# Patient Record
Sex: Male | Born: 1955
Health system: Southern US, Community
[De-identification: ages and names within clinical notes are randomized; demographics above are authoritative.]

## PROBLEM LIST (undated history)

## (undated) DIAGNOSIS — E785 Hyperlipidemia, unspecified: Secondary | ICD-10-CM

## (undated) DIAGNOSIS — J449 Chronic obstructive pulmonary disease, unspecified: Secondary | ICD-10-CM

## (undated) DIAGNOSIS — Z8619 Personal history of other infectious and parasitic diseases: Secondary | ICD-10-CM

## (undated) DIAGNOSIS — F32A Depression, unspecified: Secondary | ICD-10-CM

## (undated) DIAGNOSIS — N200 Calculus of kidney: Secondary | ICD-10-CM

## (undated) DIAGNOSIS — R0902 Hypoxemia: Secondary | ICD-10-CM

## (undated) DIAGNOSIS — I10 Essential (primary) hypertension: Secondary | ICD-10-CM

## (undated) DIAGNOSIS — K219 Gastro-esophageal reflux disease without esophagitis: Secondary | ICD-10-CM

## (undated) DIAGNOSIS — M1990 Unspecified osteoarthritis, unspecified site: Secondary | ICD-10-CM

## (undated) DIAGNOSIS — G4730 Sleep apnea, unspecified: Secondary | ICD-10-CM

## (undated) DIAGNOSIS — I509 Heart failure, unspecified: Secondary | ICD-10-CM

## (undated) DIAGNOSIS — B029 Zoster without complications: Secondary | ICD-10-CM

## (undated) DIAGNOSIS — B1920 Unspecified viral hepatitis C without hepatic coma: Secondary | ICD-10-CM

## (undated) HISTORY — DX: Hypoxemia: R09.02

## (undated) HISTORY — DX: Hyperlipidemia, unspecified: E78.5

## (undated) HISTORY — PX: FOOT SURGERY: SHX648

## (undated) HISTORY — DX: Personal history of other infectious and parasitic diseases: Z86.19

## (undated) HISTORY — DX: Zoster without complications: B02.9

## (undated) HISTORY — DX: Unspecified viral hepatitis C without hepatic coma: B19.20

## (undated) HISTORY — PX: BUNIONECTOMY: SHX129

## (undated) HISTORY — DX: Gastro-esophageal reflux disease without esophagitis: K21.9

## (undated) HISTORY — DX: Unspecified osteoarthritis, unspecified site: M19.90

## (undated) HISTORY — PX: DENTAL SURGERY: SHX609

## (undated) HISTORY — PX: LITHOTRIPSY: SUR834

## (undated) HISTORY — DX: Depression, unspecified: F32.A

---

## 2005-02-14 ENCOUNTER — Ambulatory Visit: Payer: Self-pay

## 2005-02-14 LAB — HM COLONOSCOPY

## 2007-03-24 DIAGNOSIS — I10 Essential (primary) hypertension: Secondary | ICD-10-CM | POA: Insufficient documentation

## 2007-03-24 DIAGNOSIS — F321 Major depressive disorder, single episode, moderate: Secondary | ICD-10-CM | POA: Insufficient documentation

## 2007-03-24 DIAGNOSIS — M069 Rheumatoid arthritis, unspecified: Secondary | ICD-10-CM | POA: Insufficient documentation

## 2007-03-24 DIAGNOSIS — K219 Gastro-esophageal reflux disease without esophagitis: Secondary | ICD-10-CM | POA: Insufficient documentation

## 2007-03-24 DIAGNOSIS — J45909 Unspecified asthma, uncomplicated: Secondary | ICD-10-CM | POA: Insufficient documentation

## 2007-03-24 DIAGNOSIS — F32A Depression, unspecified: Secondary | ICD-10-CM | POA: Insufficient documentation

## 2007-03-24 DIAGNOSIS — Z720 Tobacco use: Secondary | ICD-10-CM | POA: Insufficient documentation

## 2007-04-07 DIAGNOSIS — G4733 Obstructive sleep apnea (adult) (pediatric): Secondary | ICD-10-CM | POA: Insufficient documentation

## 2007-04-07 DIAGNOSIS — B182 Chronic viral hepatitis C: Secondary | ICD-10-CM | POA: Insufficient documentation

## 2007-04-07 DIAGNOSIS — G4730 Sleep apnea, unspecified: Secondary | ICD-10-CM | POA: Insufficient documentation

## 2007-04-07 DIAGNOSIS — D126 Benign neoplasm of colon, unspecified: Secondary | ICD-10-CM | POA: Insufficient documentation

## 2007-04-07 DIAGNOSIS — B1920 Unspecified viral hepatitis C without hepatic coma: Secondary | ICD-10-CM | POA: Insufficient documentation

## 2007-04-15 DIAGNOSIS — L409 Psoriasis, unspecified: Secondary | ICD-10-CM | POA: Insufficient documentation

## 2007-04-15 DIAGNOSIS — L400 Psoriasis vulgaris: Secondary | ICD-10-CM | POA: Insufficient documentation

## 2007-04-15 DIAGNOSIS — K644 Residual hemorrhoidal skin tags: Secondary | ICD-10-CM | POA: Insufficient documentation

## 2008-12-22 ENCOUNTER — Ambulatory Visit: Payer: Self-pay | Admitting: Family Medicine

## 2009-06-24 DIAGNOSIS — H9319 Tinnitus, unspecified ear: Secondary | ICD-10-CM | POA: Insufficient documentation

## 2010-01-14 DIAGNOSIS — R7303 Prediabetes: Secondary | ICD-10-CM | POA: Insufficient documentation

## 2010-07-12 ENCOUNTER — Ambulatory Visit: Payer: Self-pay | Admitting: Family Medicine

## 2010-07-18 HISTORY — PX: OTHER SURGICAL HISTORY: SHX169

## 2010-08-04 ENCOUNTER — Ambulatory Visit: Payer: Self-pay | Admitting: Family Medicine

## 2010-09-08 ENCOUNTER — Ambulatory Visit: Payer: Self-pay

## 2014-09-06 LAB — TSH: TSH: 3.43 u[IU]/mL (ref <=5.90)

## 2014-09-06 LAB — HEMOGLOBIN A1C: HEMOGLOBIN A1C: 5.8

## 2014-09-15 ENCOUNTER — Ambulatory Visit: Payer: Self-pay | Admitting: Family Medicine

## 2014-10-20 HISTORY — PX: SPIROMETRY: SHX456

## 2015-02-01 LAB — HEPATIC FUNCTION PANEL
ALT: 44 U/L — AB (ref 10–40)
AST: 41 U/L — AB (ref 14–40)

## 2015-02-01 LAB — BASIC METABOLIC PANEL
BUN: 19 mg/dL (ref 4–21)
Creatinine: 1.2 mg/dL (ref 0.6–1.3)
GLUCOSE: 88 mg/dL
POTASSIUM: 4.5 mmol/L (ref 3.4–5.3)
SODIUM: 145 mmol/L (ref 137–147)

## 2015-02-01 LAB — CBC AND DIFFERENTIAL
HEMATOCRIT: 48 % (ref 41–53)
HEMOGLOBIN: 16.3 g/dL (ref 13.5–17.5)
Platelets: 185 10*3/uL (ref 150–399)
WBC: 5.1 10^3/mL

## 2015-03-02 ENCOUNTER — Encounter: Payer: Self-pay | Admitting: *Deleted

## 2015-03-02 ENCOUNTER — Encounter
Admission: RE | Disposition: A | Discharge status: Home / Self Care | Payer: Self-pay | Source: Ambulatory Visit | Attending: Internal Medicine

## 2015-03-02 ENCOUNTER — Other Ambulatory Visit: Payer: Self-pay | Admitting: Internal Medicine

## 2015-03-02 ENCOUNTER — Ambulatory Visit
Admission: RE | Admit: 2015-03-02 | Discharge: 2015-03-02 | Disposition: A | Discharge status: Home / Self Care | Payer: PRIVATE HEALTH INSURANCE | Source: Ambulatory Visit | Attending: Internal Medicine | Admitting: Internal Medicine

## 2015-03-02 DIAGNOSIS — Z8249 Family history of ischemic heart disease and other diseases of the circulatory system: Secondary | ICD-10-CM | POA: Insufficient documentation

## 2015-03-02 DIAGNOSIS — I10 Essential (primary) hypertension: Secondary | ICD-10-CM | POA: Diagnosis not present

## 2015-03-02 DIAGNOSIS — I5020 Unspecified systolic (congestive) heart failure: Secondary | ICD-10-CM | POA: Diagnosis not present

## 2015-03-02 DIAGNOSIS — Z79899 Other long term (current) drug therapy: Secondary | ICD-10-CM | POA: Insufficient documentation

## 2015-03-02 DIAGNOSIS — E785 Hyperlipidemia, unspecified: Secondary | ICD-10-CM | POA: Diagnosis not present

## 2015-03-02 DIAGNOSIS — I429 Cardiomyopathy, unspecified: Secondary | ICD-10-CM | POA: Insufficient documentation

## 2015-03-02 DIAGNOSIS — F17200 Nicotine dependence, unspecified, uncomplicated: Secondary | ICD-10-CM | POA: Diagnosis not present

## 2015-03-02 DIAGNOSIS — Z7982 Long term (current) use of aspirin: Secondary | ICD-10-CM | POA: Diagnosis not present

## 2015-03-02 DIAGNOSIS — I2510 Atherosclerotic heart disease of native coronary artery without angina pectoris: Secondary | ICD-10-CM | POA: Diagnosis not present

## 2015-03-02 DIAGNOSIS — G4733 Obstructive sleep apnea (adult) (pediatric): Secondary | ICD-10-CM | POA: Insufficient documentation

## 2015-03-02 DIAGNOSIS — I420 Dilated cardiomyopathy: Secondary | ICD-10-CM

## 2015-03-02 DIAGNOSIS — J449 Chronic obstructive pulmonary disease, unspecified: Secondary | ICD-10-CM | POA: Diagnosis not present

## 2015-03-02 DIAGNOSIS — I5022 Chronic systolic (congestive) heart failure: Secondary | ICD-10-CM

## 2015-03-02 DIAGNOSIS — I208 Other forms of angina pectoris: Secondary | ICD-10-CM

## 2015-03-02 HISTORY — DX: Sleep apnea, unspecified: G47.30

## 2015-03-02 HISTORY — DX: Essential (primary) hypertension: I10

## 2015-03-02 HISTORY — DX: Chronic obstructive pulmonary disease, unspecified: J44.9

## 2015-03-02 HISTORY — PX: CARDIAC CATHETERIZATION: SHX172

## 2015-03-02 SURGERY — RIGHT AND LEFT HEART CATH
Anesthesia: Moderate Sedation

## 2015-03-02 MED ORDER — HEPARIN (PORCINE) IN NACL 2-0.9 UNIT/ML-% IJ SOLN
INTRAMUSCULAR | Status: AC
Start: 2015-03-02 — End: 2015-03-02
  Filled 2015-03-02: qty 500

## 2015-03-02 MED ORDER — FENTANYL CITRATE (PF) 100 MCG/2ML IJ SOLN
INTRAMUSCULAR | Status: DC | PRN
  Administered 2015-03-02: 25 ug via INTRAVENOUS

## 2015-03-02 MED ORDER — FENTANYL CITRATE (PF) 100 MCG/2ML IJ SOLN
INTRAMUSCULAR | Status: AC
  Filled 2015-03-02: qty 2

## 2015-03-02 MED ORDER — MIDAZOLAM HCL 2 MG/2ML IJ SOLN
INTRAMUSCULAR | Status: AC
  Filled 2015-03-02: qty 2

## 2015-03-02 MED ORDER — IOHEXOL 300 MG/ML  SOLN
INTRAMUSCULAR | Status: DC | PRN
  Administered 2015-03-02: 30 mL via INTRA_ARTERIAL
  Administered 2015-03-02: 100 mL via INTRA_ARTERIAL

## 2015-03-02 MED ORDER — MIDAZOLAM HCL 2 MG/2ML IJ SOLN
INTRAMUSCULAR | Status: DC | PRN
  Administered 2015-03-02: 1 mg via INTRAVENOUS

## 2015-03-02 MED ORDER — LABETALOL HCL 5 MG/ML IV SOLN
INTRAVENOUS | Status: AC
  Filled 2015-03-02: qty 4

## 2015-03-02 MED ORDER — SODIUM CHLORIDE 0.9 % IV SOLN
INTRAVENOUS | Status: DC
  Administered 2015-03-02: 12:00:00 via INTRAVENOUS

## 2015-03-02 SURGICAL SUPPLY — 13 items
CATH INFINITI 5FR ANG PIGTAIL (CATHETERS) ×2 IMPLANT
CATH INFINITI 5FR JL4 (CATHETERS) ×2 IMPLANT
CATH INFINITI JR4 5F (CATHETERS) ×2 IMPLANT
CATH SWANZ 7F THERMO (CATHETERS) ×2 IMPLANT
DEVICE CLOSURE MYNXGRIP 5F (Vascular Products) ×2 IMPLANT
GUIDEWIRE EMER 3M J .025X150CM (WIRE) ×2 IMPLANT
KIT MANI 3VAL PERCEP (MISCELLANEOUS) ×2 IMPLANT
KIT RIGHT HEART (MISCELLANEOUS) ×2 IMPLANT
NEEDLE PERC 18GX7CM (NEEDLE) ×2 IMPLANT
PACK CARDIAC CATH (CUSTOM PROCEDURE TRAY) ×2 IMPLANT
SHEATH AVANTI 5FR X 11CM (SHEATH) ×2 IMPLANT
SHEATH PINNACLE 7F 10CM (SHEATH) ×2 IMPLANT
WIRE EMERALD 3MM-J .035X150CM (WIRE) ×2 IMPLANT

## 2015-03-02 NOTE — Discharge Instructions (Signed)

## 2015-03-03 ENCOUNTER — Other Ambulatory Visit: Payer: Self-pay | Admitting: Family Medicine

## 2015-03-03 ENCOUNTER — Encounter: Payer: Self-pay | Admitting: Internal Medicine

## 2015-05-25 ENCOUNTER — Encounter
Admission: RE | Admit: 2015-05-25 | Discharge: 2015-05-25 | Disposition: A | Discharge status: Home / Self Care | Payer: 59 | Source: Ambulatory Visit | Attending: Cardiology | Admitting: Cardiology

## 2015-05-25 DIAGNOSIS — Z01812 Encounter for preprocedural laboratory examination: Secondary | ICD-10-CM | POA: Insufficient documentation

## 2015-05-25 DIAGNOSIS — I429 Cardiomyopathy, unspecified: Secondary | ICD-10-CM | POA: Insufficient documentation

## 2015-05-25 HISTORY — DX: Calculus of kidney: N20.0

## 2015-05-25 HISTORY — DX: Heart failure, unspecified: I50.9

## 2015-05-25 LAB — URINALYSIS COMPLETE WITH MICROSCOPIC (ARMC ONLY)
Bacteria, UA: NONE SEEN
Bilirubin Urine: NEGATIVE
Glucose, UA: NEGATIVE mg/dL
Hgb urine dipstick: NEGATIVE
Ketones, ur: NEGATIVE mg/dL
Leukocytes, UA: NEGATIVE
NITRITE: NEGATIVE
Protein, ur: NEGATIVE mg/dL
RBC / HPF: NONE SEEN RBC/hpf (ref 0–5)
SQUAMOUS EPITHELIAL / LPF: NONE SEEN
Specific Gravity, Urine: 1.009 (ref 1.005–1.030)
WBC, UA: NONE SEEN WBC/hpf (ref 0–5)
pH: 6 (ref 5.0–8.0)

## 2015-05-25 LAB — CBC
HCT: 48.1 % (ref 40.0–52.0)
Hemoglobin: 16.2 g/dL (ref 13.0–18.0)
MCH: 31.1 pg (ref 26.0–34.0)
MCHC: 33.7 g/dL (ref 32.0–36.0)
MCV: 92.5 fL (ref 80.0–100.0)
PLATELETS: 169 10*3/uL (ref 150–440)
RBC: 5.2 MIL/uL (ref 4.40–5.90)
RDW: 13.4 % (ref 11.5–14.5)
WBC: 4.4 10*3/uL (ref 3.8–10.6)

## 2015-05-25 LAB — DIFFERENTIAL
BASOS ABS: 0 10*3/uL (ref 0–0.1)
Basophils Relative: 1 %
EOS ABS: 0.1 10*3/uL (ref 0–0.7)
Eosinophils Relative: 3 %
Lymphocytes Relative: 26 %
Lymphs Abs: 1.1 10*3/uL (ref 1.0–3.6)
MONOS PCT: 14 %
Monocytes Absolute: 0.6 10*3/uL (ref 0.2–1.0)
NEUTROS PCT: 56 %
Neutro Abs: 2.5 10*3/uL (ref 1.4–6.5)

## 2015-05-25 LAB — APTT: APTT: 27 s (ref 24–36)

## 2015-05-25 LAB — PROTIME-INR
INR: 0.89
PROTHROMBIN TIME: 12.3 s (ref 11.4–15.0)

## 2015-05-25 LAB — BASIC METABOLIC PANEL
ANION GAP: 8 (ref 5–15)
BUN: 21 mg/dL — ABNORMAL HIGH (ref 6–20)
CO2: 30 mmol/L (ref 22–32)
Calcium: 9.3 mg/dL (ref 8.9–10.3)
Chloride: 103 mmol/L (ref 101–111)
Creatinine, Ser: 1.14 mg/dL (ref 0.61–1.24)
Glucose, Bld: 100 mg/dL — ABNORMAL HIGH (ref 65–99)
POTASSIUM: 4.1 mmol/L (ref 3.5–5.1)
Sodium: 141 mmol/L (ref 135–145)

## 2015-05-25 LAB — SURGICAL PCR SCREEN
MRSA, PCR: NEGATIVE
Staphylococcus aureus: NEGATIVE

## 2015-05-25 NOTE — Patient Instructions (Addendum)
  Your procedure is scheduled on: Friday 06/03/2015 Report to Day Surgery. 2ND FLOOR MEDICAL MALL ENTRANCE To find out your arrival time please call [phone] between 1PM - 3PM on Thursday 06/02/2015.  Remember: Instructions that are not followed completely may result in serious medical risk, up to and including death, or upon the discretion of your surgeon and anesthesiologist your surgery may need to be rescheduled.    __X__ 1. Do not eat food or drink liquids after midnight. No gum chewing or hard candies.     __X__ 2. No Alcohol for 24 hours before or after surgery.   ____ 3. Bring all medications with you on the day of surgery if instructed.    __X__ 4. Notify your doctor if there is any change in your medical condition     (cold, fever, infections).     Do not wear jewelry, make-up, hairpins, clips or nail polish.  Do not wear lotions, powders, or perfumes.   Do not shave 48 hours prior to surgery. Men may shave face and neck.  Do not bring valuables to the hospital.    [facility] is not responsible for any belongings or valuables.               Contacts, dentures or bridgework may not be worn into surgery.  Leave your suitcase in the car. After surgery it may be brought to your room.  For patients admitted to the hospital, discharge time is determined by your                treatment team.   Patients discharged the day of surgery will not be allowed to drive home.   Please read over the following fact sheets that you were given:   MRSA Information and Surgical Site Infection Prevention   __X__ Take these medicines the morning of surgery with A SIP OF WATER:    1. LOSARTAN  2. METOPROLOL  3. RANITIDINE AT BEDTIME AND AGAIN MORNING OF PROCEDURE  4. VENLAFAXINE  5. USE YOUR INHALER  6.  ____ Fleet Enema (as directed)   __X__ Use CHG Soap as directed  __X__ Use inhalers on the day of surgery  ____ Stop metformin 2 days prior to surgery    ____ Take 1/2 of usual  insulin dose the night before surgery and none on the morning of surgery.   __X__ Stop Coumadin/Plavix/aspirin AS DIRECTED BY DR. Marcello Moores OR DR. CALWOOD  ____ Stop Anti-inflammatories on    ____ Stop supplements until after surgery.    __X__ Bring C-Pap to the hospital.

## 2015-06-03 ENCOUNTER — Ambulatory Visit: Payer: 59 | Admitting: Anesthesiology

## 2015-06-03 ENCOUNTER — Encounter: Payer: Self-pay | Admitting: *Deleted

## 2015-06-03 ENCOUNTER — Observation Stay
Admission: RE | Admit: 2015-06-03 | Discharge: 2015-06-04 | Disposition: A | Discharge status: Home / Self Care | Payer: 59 | Source: Ambulatory Visit | Attending: Internal Medicine | Admitting: Internal Medicine

## 2015-06-03 ENCOUNTER — Ambulatory Visit: Payer: 59

## 2015-06-03 ENCOUNTER — Encounter
Admission: RE | Disposition: A | Discharge status: Home / Self Care | Payer: Self-pay | Source: Ambulatory Visit | Attending: Internal Medicine

## 2015-06-03 DIAGNOSIS — Z950 Presence of cardiac pacemaker: Secondary | ICD-10-CM

## 2015-06-03 DIAGNOSIS — I10 Essential (primary) hypertension: Secondary | ICD-10-CM | POA: Insufficient documentation

## 2015-06-03 DIAGNOSIS — J449 Chronic obstructive pulmonary disease, unspecified: Secondary | ICD-10-CM | POA: Insufficient documentation

## 2015-06-03 DIAGNOSIS — Z7982 Long term (current) use of aspirin: Secondary | ICD-10-CM | POA: Diagnosis not present

## 2015-06-03 DIAGNOSIS — E785 Hyperlipidemia, unspecified: Secondary | ICD-10-CM | POA: Insufficient documentation

## 2015-06-03 DIAGNOSIS — Z79899 Other long term (current) drug therapy: Secondary | ICD-10-CM | POA: Diagnosis not present

## 2015-06-03 DIAGNOSIS — I25119 Atherosclerotic heart disease of native coronary artery with unspecified angina pectoris: Secondary | ICD-10-CM | POA: Insufficient documentation

## 2015-06-03 DIAGNOSIS — I420 Dilated cardiomyopathy: Secondary | ICD-10-CM | POA: Insufficient documentation

## 2015-06-03 DIAGNOSIS — I429 Cardiomyopathy, unspecified: Secondary | ICD-10-CM | POA: Insufficient documentation

## 2015-06-03 DIAGNOSIS — R079 Chest pain, unspecified: Secondary | ICD-10-CM | POA: Diagnosis not present

## 2015-06-03 DIAGNOSIS — I5022 Chronic systolic (congestive) heart failure: Secondary | ICD-10-CM | POA: Diagnosis not present

## 2015-06-03 DIAGNOSIS — F17200 Nicotine dependence, unspecified, uncomplicated: Secondary | ICD-10-CM | POA: Insufficient documentation

## 2015-06-03 DIAGNOSIS — Z6837 Body mass index (BMI) 37.0-37.9, adult: Secondary | ICD-10-CM | POA: Insufficient documentation

## 2015-06-03 DIAGNOSIS — K219 Gastro-esophageal reflux disease without esophagitis: Secondary | ICD-10-CM | POA: Diagnosis not present

## 2015-06-03 DIAGNOSIS — E669 Obesity, unspecified: Secondary | ICD-10-CM | POA: Insufficient documentation

## 2015-06-03 DIAGNOSIS — G4733 Obstructive sleep apnea (adult) (pediatric): Secondary | ICD-10-CM | POA: Diagnosis not present

## 2015-06-03 DIAGNOSIS — R001 Bradycardia, unspecified: Secondary | ICD-10-CM | POA: Insufficient documentation

## 2015-06-03 HISTORY — PX: IMPLANTABLE CARDIOVERTER DEFIBRILLATOR (ICD) GENERATOR CHANGE: SHX5469

## 2015-06-03 SURGERY — ICD GENERATOR CHANGE
Anesthesia: General | Site: Chest | Laterality: Right | Wound class: Clean

## 2015-06-03 MED ORDER — VANCOMYCIN HCL 10 G IV SOLR
1500.0000 mg | Freq: Once | INTRAVENOUS | Status: AC
  Administered 2015-06-03: 1500 mg via INTRAVENOUS
  Filled 2015-06-03: qty 1500

## 2015-06-03 MED ORDER — GENTAMICIN SULFATE 40 MG/ML IJ SOLN
INTRAMUSCULAR | Status: AC
  Filled 2015-06-03: qty 2

## 2015-06-03 MED ORDER — LISINOPRIL 20 MG PO TABS
20.0000 mg | ORAL_TABLET | Freq: Every day | ORAL | Status: DC
  Administered 2015-06-03 – 2015-06-04 (×2): 20 mg via ORAL
  Filled 2015-06-03 (×2): qty 1

## 2015-06-03 MED ORDER — BUDESONIDE-FORMOTEROL FUMARATE 160-4.5 MCG/ACT IN AERO
2.0000 | INHALATION_SPRAY | Freq: Two times a day (BID) | RESPIRATORY_TRACT | Status: DC
  Administered 2015-06-03 – 2015-06-04 (×3): 2 via RESPIRATORY_TRACT
  Filled 2015-06-03: qty 6

## 2015-06-03 MED ORDER — LABETALOL HCL 5 MG/ML IV SOLN
5.0000 mg | INTRAVENOUS | Status: DC | PRN
  Administered 2015-06-03 (×2): 5 mg via INTRAVENOUS

## 2015-06-03 MED ORDER — FENTANYL CITRATE (PF) 100 MCG/2ML IJ SOLN: 25.0000 ug | INTRAMUSCULAR | Status: DC | PRN

## 2015-06-03 MED ORDER — MIDAZOLAM HCL 2 MG/2ML IJ SOLN
INTRAMUSCULAR | Status: DC | PRN
  Administered 2015-06-03: 2 mg via INTRAVENOUS

## 2015-06-03 MED ORDER — ACETAMINOPHEN 325 MG PO TABS: 325.0000 mg | ORAL_TABLET | ORAL | Status: DC | PRN

## 2015-06-03 MED ORDER — ONDANSETRON HCL 4 MG/2ML IJ SOLN: 4.0000 mg | Freq: Once | INTRAMUSCULAR | Status: DC | PRN

## 2015-06-03 MED ORDER — ASPIRIN EC 81 MG PO TBEC
81.0000 mg | DELAYED_RELEASE_TABLET | Freq: Every day | ORAL | Status: DC
  Administered 2015-06-03 – 2015-06-04 (×2): 81 mg via ORAL
  Filled 2015-06-03 (×2): qty 1

## 2015-06-03 MED ORDER — METOPROLOL SUCCINATE ER 50 MG PO TB24
150.0000 mg | ORAL_TABLET | Freq: Every day | ORAL | Status: DC
  Administered 2015-06-04: 150 mg via ORAL
  Filled 2015-06-03 (×2): qty 1

## 2015-06-03 MED ORDER — LIDOCAINE 1 % OPTIME INJ - NO CHARGE
INTRAMUSCULAR | Status: DC | PRN
  Administered 2015-06-03: 20 mL
  Administered 2015-06-03: 10 mL

## 2015-06-03 MED ORDER — ONDANSETRON HCL 4 MG/2ML IJ SOLN: 4.0000 mg | Freq: Four times a day (QID) | INTRAMUSCULAR | Status: DC | PRN

## 2015-06-03 MED ORDER — SODIUM CHLORIDE 0.9 % IR SOLN
Freq: Once | Status: AC
  Administered 2015-06-03: 14:00:00
  Filled 2015-06-03: qty 2

## 2015-06-03 MED ORDER — CLINDAMYCIN PHOSPHATE 600 MG/50ML IV SOLN
INTRAVENOUS | Status: AC
  Filled 2015-06-03: qty 50

## 2015-06-03 MED ORDER — LABETALOL HCL 5 MG/ML IV SOLN
INTRAVENOUS | Status: AC
  Administered 2015-06-03: 5 mg via INTRAVENOUS
  Filled 2015-06-03: qty 4

## 2015-06-03 MED ORDER — HEPARIN SODIUM (PORCINE) 5000 UNIT/ML IJ SOLN
INTRAMUSCULAR | Status: AC
  Filled 2015-06-03: qty 1

## 2015-06-03 MED ORDER — SODIUM CHLORIDE 0.9 % IR SOLN
Status: DC | PRN
  Administered 2015-06-03: 100 mL

## 2015-06-03 MED ORDER — FUROSEMIDE 80 MG PO TABS
80.0000 mg | ORAL_TABLET | Freq: Every day | ORAL | Status: DC
  Administered 2015-06-03 – 2015-06-04 (×2): 80 mg via ORAL
  Filled 2015-06-03 (×3): qty 1

## 2015-06-03 MED ORDER — CLINDAMYCIN PHOSPHATE 600 MG/50ML IV SOLN
600.0000 mg | Freq: Once | INTRAVENOUS | Status: AC
  Administered 2015-06-03: 600 mg via INTRAVENOUS

## 2015-06-03 MED ORDER — PROPOFOL 10 MG/ML IV BOLUS
INTRAVENOUS | Status: DC | PRN
  Administered 2015-06-03: 40 mg via INTRAVENOUS

## 2015-06-03 MED ORDER — FAMOTIDINE 20 MG PO TABS
20.0000 mg | ORAL_TABLET | Freq: Every day | ORAL | Status: DC
  Administered 2015-06-03 – 2015-06-04 (×2): 20 mg via ORAL
  Filled 2015-06-03 (×2): qty 1

## 2015-06-03 MED ORDER — LOSARTAN POTASSIUM 50 MG PO TABS
100.0000 mg | ORAL_TABLET | Freq: Every day | ORAL | Status: DC
  Administered 2015-06-03 – 2015-06-04 (×2): 100 mg via ORAL
  Filled 2015-06-03 (×2): qty 2

## 2015-06-03 MED ORDER — VENLAFAXINE HCL ER 75 MG PO CP24
300.0000 mg | ORAL_CAPSULE | Freq: Every day | ORAL | Status: DC
  Administered 2015-06-04: 300 mg via ORAL
  Filled 2015-06-03: qty 2
  Filled 2015-06-03: qty 4

## 2015-06-03 MED ORDER — LISINOPRIL-HYDROCHLOROTHIAZIDE 20-12.5 MG PO TABS: 2.0000 | ORAL_TABLET | Freq: Every day | ORAL | Status: DC

## 2015-06-03 MED ORDER — LACTATED RINGERS IV SOLN
INTRAVENOUS | Status: DC
  Administered 2015-06-03 (×2): via INTRAVENOUS

## 2015-06-03 MED ORDER — HYDROCHLOROTHIAZIDE 12.5 MG PO CAPS
12.5000 mg | ORAL_CAPSULE | Freq: Every day | ORAL | Status: DC
  Administered 2015-06-03 – 2015-06-04 (×2): 12.5 mg via ORAL
  Filled 2015-06-03 (×2): qty 1

## 2015-06-03 MED ORDER — FENTANYL CITRATE (PF) 100 MCG/2ML IJ SOLN
INTRAMUSCULAR | Status: DC | PRN
  Administered 2015-06-03 (×2): 25 ug via INTRAVENOUS

## 2015-06-03 MED ORDER — LACTATED RINGERS IV SOLN
INTRAVENOUS | Status: DC
  Administered 2015-06-03 (×2): via INTRAVENOUS

## 2015-06-03 MED ORDER — SPIRONOLACTONE 12.5 MG HALF TABLET
12.5000 mg | ORAL_TABLET | Freq: Every day | ORAL | Status: DC
  Administered 2015-06-03 – 2015-06-04 (×2): 12.5 mg via ORAL
  Filled 2015-06-03 (×3): qty 1

## 2015-06-03 MED ORDER — PROPOFOL INFUSION 10 MG/ML OPTIME
INTRAVENOUS | Status: DC | PRN
  Administered 2015-06-03: 25 ug/kg/min via INTRAVENOUS

## 2015-06-03 SURGICAL SUPPLY — 43 items
BAG DECANTER FOR FLEXI CONT (MISCELLANEOUS) ×2 IMPLANT
CABLE SURG 12 DISP A/V CHANNEL (MISCELLANEOUS) ×2 IMPLANT
CANISTER SUCT 1200ML W/VALVE (MISCELLANEOUS) ×2 IMPLANT
CHLORAPREP W/TINT 26ML (MISCELLANEOUS) ×2 IMPLANT
COVER LIGHT HANDLE STERIS (MISCELLANEOUS) ×4 IMPLANT
COVER MAYO STAND STRL (DRAPES) ×2 IMPLANT
COVER PROBE FLX POLY STRL (MISCELLANEOUS) IMPLANT
DRAPE C-ARM XRAY 36X54 (DRAPES) ×2 IMPLANT
DRESSING TELFA 4X3 1S ST N-ADH (GAUZE/BANDAGES/DRESSINGS) ×2 IMPLANT
DRSG TEGADERM 4X4.75 (GAUZE/BANDAGES/DRESSINGS) ×2 IMPLANT
GLOVE BIO SURGEON STRL SZ7.5 (GLOVE) ×6 IMPLANT
GOWN STRL REUS W/ TWL LRG LVL3 (GOWN DISPOSABLE) ×1 IMPLANT
GOWN STRL REUS W/ TWL XL LVL3 (GOWN DISPOSABLE) ×1 IMPLANT
GOWN STRL REUS W/TWL LRG LVL3 (GOWN DISPOSABLE) ×1
GOWN STRL REUS W/TWL XL LVL3 (GOWN DISPOSABLE) ×1
HANDLE YANKAUER SUCT BULB TIP (MISCELLANEOUS) IMPLANT
HIBICLENS CHG 4% 32OZ (MISCELLANEOUS) ×2 IMPLANT
ICD VISIA MRI VR DVFB1D4 (ICD Generator) ×1 IMPLANT
IMMOBILIZER SHDR LG LX 900803 (SOFTGOODS) ×2 IMPLANT
INTRO PACEMAKR LEAD 9FR 13CM (INTRODUCER) ×2
INTRODUCER PACEMKR LD 9FR 13CM (INTRODUCER) ×1 IMPLANT
IV NS 1000ML (IV SOLUTION) ×1
IV NS 1000ML BAXH (IV SOLUTION) ×1 IMPLANT
KIT RM TURNOVER STRD PROC AR (KITS) ×2 IMPLANT
LABEL OR SOLS (LABEL) IMPLANT
LEAD SPRINT QUAT SEC 6935M-55 (Lead) ×2 IMPLANT
NEEDLE FILTER BLUNT 18X 1/2SAF (NEEDLE) ×1
NEEDLE FILTER BLUNT 18X1 1/2 (NEEDLE) ×1 IMPLANT
NEEDLE HYPO 25X1 1.5 SAFETY (NEEDLE) ×2 IMPLANT
NEEDLE SPNL 22GX3.5 QUINCKE BK (NEEDLE) IMPLANT
NS IRRIG 500ML POUR BTL (IV SOLUTION) ×2 IMPLANT
PACK PACE INSERTION (MISCELLANEOUS) ×2 IMPLANT
PAD GROUND ADULT SPLIT (MISCELLANEOUS) ×2 IMPLANT
PAD STATPAD (MISCELLANEOUS) ×2 IMPLANT
SUT DVC V-LOC 4-0 90 CLR P-12 (SUTURE) ×2
SUT DVC VLOC 3-0 CL 6 P-12 (SUTURE) ×2 IMPLANT
SUT SILK 0 CT 1 30 (SUTURE) ×2 IMPLANT
SUT VIC AB 3-0 PS2 18 (SUTURE) ×2 IMPLANT
SUTURE DVC V-LC4-0 90 CLR P-12 (SUTURE) ×1 IMPLANT
SYR CONTROL 10ML (SYRINGE) IMPLANT
SYRINGE 10CC LL (SYRINGE) IMPLANT
TOWEL OR 17X26 4PK STRL BLUE (TOWEL DISPOSABLE) ×2 IMPLANT
VISIA MRI VR DVFB1D4 (ICD Generator) ×2 IMPLANT

## 2015-06-03 NOTE — Progress Notes (Signed)
Skin verified by Purcell Nails., RN

## 2015-06-03 NOTE — Transfer of Care (Signed)
Immediate Anesthesia Transfer of Care Note  Patient: Dylan Lee  Procedure(s) Performed: Procedure(s): ICD generator insertion  (Right)  Patient Location: PACU  Anesthesia Type:General  Level of Consciousness: sedated  Airway & Oxygen Therapy: Patient Spontanous Breathing and Patient connected to face mask oxygen  Post-op Assessment: Report given to RN and Post -op Vital signs reviewed and stable  Post vital signs: Reviewed and stable  Last Vitals:  Filed Vitals:   06/03/15 1439  BP: 160/96  Pulse:   Temp: 36.3 C  Resp: 10    Complications: No apparent anesthesia complications

## 2015-06-03 NOTE — Op Note (Signed)
Dylan Lee [id number]  [id number]  Preop JJ:HERDEYC systolic heart failure Postop Dx same s/p ICD   Procedure: single chamber ICD implantation without intraoperative defibrillation threshold testing  Following the obtaining of informed consent the patient was brought to the electrophysiology laboratory in place of the fluoroscopic table in the supine position. After routine prep and drape, lidocaine was infiltrated in the right prepectoral subclavicular region and an incision was made and carried down to the layer of the prepectoral fascia using electrocautery and sharp dissection. A pocket was formed similarly.  Thereafter an attention was turned to gain access to the extrathoracic right subclavian vein which was accomplished without difficulty and without the aspiration of air or puncture of the artery. A 9 French sheath was placed for which was then passed a  single coil  active fixation defibrillator lead, model 6935M serial number [id number]. It was  passed under fluoroscopic guidance to the right ventricular apex. In its location the bipolar R wave was 13 millivolts, impedance was 709 ohms, the pacing threshold was 0.7 volts at 0.4 msec. Current at threshold was 1.1 mA.  There was no diaphragmatic pacing at 10 V. The current of injury was brisk.  The lead was secured to the prepectoral fascia and then attached to a MDT ICD, serial number  VISIA AF [id number].   The pocket was copiously irrigated with antibiotic containing saline solution. Hemostasis was assured, and the device and the lead was placed in the pocket and secured to the prepectoral fascia.  The wound was closed in 2v vicryl 3 v lock  layers in normal fashion. The wound was washed dried and a 4.0 v lock closed last layer, benzoin Steri-Strips dressing was then applied. Needle counts sponge counts and instrument counts were correct at the end of the procedure according to the staff.  Patient tolerated the procedure without  apparent complication  EBL  Minimal    Programmed VVI 40  VF 200J 30/40 ATP during charging and 35J shocks thereafter. VT monitor 162 Cx: None     THOMAS,KEVIN L., MD 06/03/2015 12:48 PM

## 2015-06-03 NOTE — Anesthesia Postprocedure Evaluation (Signed)
  Anesthesia Post-op Note  Patient: Dylan Lee  Procedure(s) Performed: Procedure(s): ICD generator insertion  (Right)  Anesthesia type:General  Patient location: PACU  Post pain: Pain level controlled  Post assessment: Post-op Vital signs reviewed, Patient's Cardiovascular Status Stable, Respiratory Function Stable, Patent Airway and No signs of Nausea or vomiting  Post vital signs: Reviewed and stable  Last Vitals:  Filed Vitals:   06/03/15 1551  BP: 160/94  Pulse: 58  Temp: 36.2 C  Resp: 13    Level of consciousness: awake, alert  and patient cooperative  Complications: No apparent anesthesia complications

## 2015-06-03 NOTE — Anesthesia Preprocedure Evaluation (Signed)
Anesthesia Evaluation  Patient identified by MRN, date of birth, ID band Patient awake    Reviewed: Allergy & Precautions, NPO status , Patient's Chart, lab work & pertinent test results  History of Anesthesia Complications Negative for: history of anesthetic complications  Airway Mallampati: III       Dental  (+) Upper Dentures, Lower Dentures   Pulmonary sleep apnea (uses BiPAP) and Continuous Positive Airway Pressure Ventilation , COPD,  COPD inhaler, Current Smoker (1-5 cig/day),           Cardiovascular hypertension, Pt. on medications and Pt. on home beta blockers (-) angina+ CAD, + Past MI and +CHF       Neuro/Psych negative neurological ROS     GI/Hepatic GERD  Medicated and Controlled,(+) Hepatitis -, C  Endo/Other    Renal/GU Renal disease (stones)     Musculoskeletal   Abdominal   Peds  Hematology   Anesthesia Other Findings   Reproductive/Obstetrics                             Anesthesia Physical Anesthesia Plan  ASA: III  Anesthesia Plan: General   Post-op Pain Management:    Induction: Intravenous  Airway Management Planned: Nasal Cannula  Additional Equipment:   Intra-op Plan:   Post-operative Plan:   Informed Consent: I have reviewed the patients History and Physical, chart, labs and discussed the procedure including the risks, benefits and alternatives for the proposed anesthesia with the patient or authorized representative who has indicated his/her understanding and acceptance.     Plan Discussed with:   Anesthesia Plan Comments:         Anesthesia Quick Evaluation

## 2015-06-03 NOTE — H&P (Signed)
Dylan Lee is a very pleasant 59 y.o. male who I was asked to see for consideration of a primary prevention ICD in a patient with NICM EF 20-25%.   Problem List:  Problem List Date Reviewed: 05-29-2015  Codes Priority Class Noted - Resolved  Chronic systolic HF (heart failure) ICD-10-CM: I50.22 ICD-9-CM: 428.22 05/29/2015 - Present  CHF (congestive heart failure) ICD-10-CM: I50.9 ICD-9-CM: 428.0 Unknown - Present  Hyperlipidemia ICD-10-CM: E78.5 ICD-9-CM: 272.4 Unknown - Present  Impotence of organic origin ICD-10-CM: N52.9 ICD-9-CM: 607.84 09/22/2014 - Present  Abnormal respiratory rate ICD-10-CM: R06.9 ICD-9-CM: 786.09 09/22/2014 - Present  Sleep apnea ICD-10-CM: G47.30 ICD-9-CM: 780.57 04/07/2007 - Present  Overview Signed 09/22/2014 9:30 AM by Loman Chroman, CMA  Improving. s/p repeat sleep study; ENT evaluation completed; no surgical intervention warranted for OSA. Sleep study 07/2010: +severe OSA; bilevel 19/14 initiated. Sleep study 07/2010: +severe OSA; bilevel 19/14 initiated.   Benign essential hypertension ICD-10-CM: I10 ICD-9-CM: 401.1 03/24/2007 - Present  Overview Signed 09/22/2014 9:30 AM by Loman Chroman, CMA  Improved wih increase lisinopril-hctz.   Tobacco use ICD-10-CM: Z72.0 ICD-9-CM: 305.1 03/24/2007 - Present  Overview Signed 09/22/2014 9:30 AM by Loman Chroman, CMA  Patient Risk Factors.       Current Medications:  Current Outpatient Prescriptions  Medication Sig Dispense Refill  . aspirin 81 MG EC tablet Take 81 mg by mouth once daily.  . FUROsemide (LASIX) 80 MG tablet Take 1 tablet (80 mg total) by mouth 2 (two) times daily. 60 tablet 3  . lisinopril-hydrochlorothiazide (PRINZIDE,ZESTORETIC) 20-12.5 mg tablet Take 2 tablets by mouth once daily.  Marland Kitchen losartan (COZAAR) 50 MG tablet Take 2 tablets (100 mg total) by mouth once daily. 60 tablet 5  . metoprolol succinate (TOPROL-XL) 25 MG XL tablet Take 2 tablets (50 mg total) by mouth once daily. 60  tablet 5  . ranitidine (ZANTAC) 150 MG tablet Take 150 mg by mouth every morning.  Marland Kitchen spironolactone (ALDACTONE) 25 MG tablet Take 0.5 tablets (12.5 mg total) by mouth once daily. 15 tablet 11  . venlafaxine (EFFEXOR-XR) 150 MG XR capsule Take 300 mg by mouth once daily.   No current facility-administered medications for this visit.    Allergies:  Review of patient's allergies indicates no known allergies.  History of Present Illness   Dylan Lee is a very pleasant 59 y.o. male with long standing hypertension poorly controlled by office BP measurements.. The patient has had shortness of breath, has had chest pain, has not had near syncope, has not syncope, and has not had palpitations. The patient has had orthopnea or PND. He has not picked up prescription for metoprolol and in general i'm not sure of his medicine compliance.  Family History   Patient denies any family history of sudden cardiac death.  Social History   History   Social History  . Marital Status: Married  Spouse Name: N/A  . Number of Children: N/A  . Years of Education: N/A   Occupational History  . Not on file.   Social History Main Topics  . Smoking status: Current Every Day Smoker  . Smokeless tobacco: Never Used  . Alcohol Use: No  . Drug Use: No  . Sexual Activity: Defer   Other Topics Concern  . Not on file   Social History Narrative   Review of Systems   14 systems reviewed pertinent positives and negatives as noted in the HPI and below.  + anxiety, back pain, depression, weakness fatigue, weight gain,  night sweats, dizziness, myalgias, muscle weakness, selling hands and feet, presyncope, leg cramps, DOE, OSA, wheezing, cough, excessive sweating  Physical Examinations   Vital Signs: BP 168/102 mmHg  Pulse 72  Ht 175.3 cm (5\' 9" )  Wt 116.801 kg (257 lb 8 oz)  BMI 38.01 kg/m2  SpO2 97% Body mass index is 38.01 kg/(m^2). General appearance: He appears well, in no apparent distress. Alert  and oriented times three.   Neck: supple, no significant adenopathy, carotids upstroke normal bilaterally, no bruits   Thyroid: thyroid is normal in size without nodules or tenderness   Lungs: clear to auscultation, no wheezes, rales or rhonchi, symmetric air entry   CV: normal rate and regular rhythm  Abdomen: soft, nontender, nondistended, no masses or organomegaly   Neurological: alert, oriented, normal speech, no focal findings or movement disorder noted   Musculoskeletal: full range of motion without pain, moves all extremities. Ambulates without difficulty.   Extremities: no pedal edema, no clubbing or cyanosis   ECG  Rhythm: normal sinus rhythm, rate=63 bpm, QTc=409 ms. QRS=104 No ST segment or T-wave abnormalities.  Echocardiogram  Date:08/2014  LA size: 3.8 Ejection fraction: 20-25% LV wall thickness: septum 1.3 Significant valvular disease: no.  Labs   Hematology Lab Results  Component Value Date  WBC 5.4 02/22/2015  HGB 16.0 02/22/2015  HCT 46.9 02/22/2015  MCV 94.4 02/22/2015  PLT 179 02/22/2015   Chemistries Lab Results  Component Value Date  CREATININE 1.3 02/22/2015  BUN 22 02/22/2015  NA 142 02/22/2015  K 4.6 02/22/2015  CL 102 02/22/2015  CO2 33.1* 02/22/2015   Liver Function Studies No results found for: ALT, AST, GGT, ALKPHOS, BILITOT  Thyroid Function Studies No results found for: TSH, T3TOTAL, T4TOTAL, THYROIDAB  INR No results found for: INR  Assessment and Plan  Given the patient's EF 25-30% NYHA class II sxs, he is at an increased risk of sudden cardiac death and may benefit from an ICD. I spent some time with him reviewing the potential benefits and risks of the procedure. Specifically, I informed him that the potential risks include but are not limited to infection, bleeding, pneumothorax, and cardiac perforation with or without tamponade. He verbalized understanding, and he signed the consent form in my presence in clinic today.  His procedure has not been scheduled. He will discuss with his wife and call Dr. Call wood's office when he is ready to proceed. He knows to be n.p.o. after midnight the night before his procedure. We plan to implant a single coil single-chamber ICD in the right infraclavicular region, and I plan not to do DFT testing. I plan to give him vancomycin and clindamycin before his procedure. He knows to call us should he have any questions or problems at any time.  I have encouraged smoking cessation, weight loss and exercise to optimize his cardiovascular health.

## 2015-06-04 ENCOUNTER — Observation Stay: Payer: 59

## 2015-06-04 DIAGNOSIS — I5022 Chronic systolic (congestive) heart failure: Secondary | ICD-10-CM | POA: Diagnosis not present

## 2015-06-04 NOTE — Progress Notes (Signed)
Pt in NAD, VSS, SR per monitor.  Pt denies any pain or discomfort at this time.  IV and telemetry discontinued per policy and procedure.  Discharge instructions given to and reviewed with pt.  Pt discharged home.

## 2015-06-04 NOTE — Discharge Instructions (Signed)
Rest right arm and shoulder for about one week Pain medicines as necessary for soreness Do not drive for several days Call the office if any fever or bleeding or swelling over the incision site. He should have restricted duties at work over the next one to 2 weeks

## 2015-06-04 NOTE — Discharge Summary (Signed)
Physician Discharge Summary  Patient ID: Dylan Lee MRN: [id number] DOB/AGE: 09-25-1955 59 y.o.  Admit date: 06/03/2015 Discharge date: 06/04/2015  Admission Diagnoses: Cardiomyopathy bradycardia preop AICD  Discharge Diagnoses:  Active Problems:   Bradycardia  cardiomyopathy  Discharged Condition: stable  Hospital Course: Patient status post AICD placement by EP Dr. Marcello Moores patient is done reasonably well ready to go home AICD site on the right shoulder looks good no bleeding no swelling no fever  Consults: None  Significant Diagnostic Studies: radiology: CXR: normal  Treatments: None  Discharge Exam: Blood pressure 161/94, pulse 60, temperature 98.4 F (36.9 C), temperature source Oral, resp. rate 18, height 5\' 9"  (1.753 m), weight 116.075 kg (255 lb 14.4 oz), SpO2 97 %. General appearance: cooperative and appears older than stated age Resp: clear to auscultation bilaterally Chest wall: no tenderness, right sided chest wall tenderness Cardio: regular rate and rhythm, S1, S2 normal, no murmur, click, rub or gallop Extremities: extremities normal, atraumatic, no cyanosis or edema Pulses: 2+ and symmetric Neurologic: Alert and oriented X 3, normal strength and tone. Normal symmetric reflexes. Normal coordination and gait  Disposition: 01-Home or Self Care  Discharge Instructions    Diet - low sodium heart healthy    Complete by:  As directed      Increase activity slowly    Complete by:  As directed             Medication List    TAKE these medications        aspirin 81 MG tablet  Take 81 mg by mouth daily.     furosemide 80 MG tablet  Commonly known as:  LASIX  Take 80 mg by mouth daily.     lisinopril-hydrochlorothiazide 20-12.5 MG per tablet  Commonly known as:  PRINZIDE,ZESTORETIC  Take 2 tablets by mouth daily.     losartan 50 MG tablet  Commonly known as:  COZAAR  Take 100 mg by mouth daily.     metoprolol succinate 100 MG 24 hr tablet   Commonly known as:  TOPROL-XL  Take 150 mg by mouth daily. Take with or immediately following a meal.     ranitidine 150 MG tablet  Commonly known as:  ZANTAC  Take 150 mg by mouth at bedtime.     spironolactone 25 MG tablet  Commonly known as:  ALDACTONE  Take 12.5 mg by mouth daily.     SYMBICORT 160-4.5 MCG/ACT inhaler  Generic drug:  budesonide-formoterol  INHALE TWO PUFFS BY MOUTH TWICE DAILY     venlafaxine XR 150 MG 24 hr capsule  Commonly known as:  EFFEXOR-XR  Take 300 mg by mouth daily with breakfast.           Follow-up Information    Follow up with CALLWOOD,DWAYNE D., MD In 2 weeks.   Specialties:  Cardiology, Internal Medicine   Contact information:   [place] [address] [phone]       Signed: Yolonda Kida. 06/04/2015, 9:30 AM

## 2015-06-04 NOTE — Progress Notes (Signed)
Subjective:  Status post AICD placement. Patient states to be done reasonably well placed in his right shoulder by EP Dr. Marcello Moores. No bleeding no fever chills or sweats feels ready to go home right arm in the immobilizer  Objective:  Vital Signs in the last 24 hours: Temp:  [97.1 F (36.2 C)-98.4 F (36.9 C)] 98.4 F (36.9 C) (09/17 0443) Pulse Rate:  [57-73] 60 (09/17 0443) Resp:  [13-20] 18 (09/17 0443) BP: (160-175)/(90-106) 161/94 mmHg (09/17 0443) SpO2:  [96 %-100 %] 97 % (09/17 0443) Weight:  [116.075 kg (255 lb 14.4 oz)-116.574 kg (257 lb)] 116.075 kg (255 lb 14.4 oz) (09/16 1644)  Intake/Output from previous day: 09/16 0701 - 09/17 0700 In: 425 [I.V.:425] Out: 1750 [Urine:1750] Intake/Output from this shift:    Physical Exam: General appearance: alert, cooperative and appears stated age Neck: no adenopathy, no carotid bruit, no JVD, supple, symmetrical, trachea midline and thyroid not enlarged, symmetric, no tenderness/mass/nodules Lungs: clear to auscultation bilaterally Heart: regular rate and rhythm, S1, S2 normal, no murmur, click, rub or gallop Abdomen: soft, non-tender; bowel sounds normal; no masses,  no organomegaly Extremities: extremities normal, atraumatic, no cyanosis or edema Pulses: 2+ and symmetric Skin: Skin color, texture, turgor normal. No rashes or lesions Neurologic: Alert and oriented X 3, normal strength and tone. Normal symmetric reflexes. Normal coordination and gait Right shoulder incision site appears to be clean bandage in place no swelling no bleeding no tenderness Lab Results: No results for input(s): WBC, HGB, PLT in the last 72 hours. No results for input(s): NA, K, CL, CO2, GLUCOSE, BUN, CREATININE in the last 72 hours. No results for input(s): TROPONINI in the last 72 hours.  Invalid input(s): CK, MB Hepatic Function Panel No results for input(s): PROT, ALBUMIN, AST, ALT, ALKPHOS, BILITOT, BILIDIR, IBILI in the last 72 hours. No results  for input(s): CHOL in the last 72 hours. No results for input(s): PROTIME in the last 72 hours.  Imaging: Imaging results have been reviewed  Cardiac Studies:  Assessment/Plan: Status post AICD placement postop day 1 Angina Cardiomyopathy CHF Coronary Artery Disease Shortness of Breath  Obesity Dilated cardiomyopathy Hypertension GERD Bradycardia . PLAN Patient patient previously well postoperatively 1 no swelling no bleeding no significant pain Continue maintaining right arm sling for 48-72 hours The patient follow-up with cardiology one to 2 weeks Should be able to return to work in 3-4 days Continue metoprolol and start lisinopril for blood pressure control in addition to spironolactone Aspirin therapy for anticoagulation Continue Lasix for swelling and congestive heart failure Cardiomyopathy and heart failure therapy with lisinopril uncertain metoprolol and spironolactone and Lasix Continue Prevacid for reflux symptoms Inhalers for COPD shortness of breath Patient appears to be okay to discharge home with follow-up     CALLWOOD,DWAYNE D. 06/04/2015, 9:14 AM

## 2015-06-05 ENCOUNTER — Encounter: Payer: Self-pay | Admitting: Family Medicine

## 2015-06-06 ENCOUNTER — Encounter: Payer: Self-pay | Admitting: Cardiology

## 2015-09-06 ENCOUNTER — Ambulatory Visit (INDEPENDENT_AMBULATORY_CARE_PROVIDER_SITE_OTHER): Payer: 59 | Admitting: Family Medicine

## 2015-09-06 ENCOUNTER — Encounter: Payer: Self-pay | Admitting: Family Medicine

## 2015-09-06 VITALS — BP 180/100 | HR 81 | Temp 99.3°F | Resp 16 | Ht 69.5 in | Wt 259.0 lb

## 2015-09-06 DIAGNOSIS — IMO0001 Reserved for inherently not codable concepts without codable children: Secondary | ICD-10-CM

## 2015-09-06 DIAGNOSIS — J449 Chronic obstructive pulmonary disease, unspecified: Secondary | ICD-10-CM | POA: Insufficient documentation

## 2015-09-06 DIAGNOSIS — R059 Cough, unspecified: Secondary | ICD-10-CM

## 2015-09-06 DIAGNOSIS — B029 Zoster without complications: Secondary | ICD-10-CM | POA: Insufficient documentation

## 2015-09-06 DIAGNOSIS — N200 Calculus of kidney: Secondary | ICD-10-CM | POA: Insufficient documentation

## 2015-09-06 DIAGNOSIS — J1289 Other viral pneumonia: Secondary | ICD-10-CM | POA: Diagnosis not present

## 2015-09-06 DIAGNOSIS — F17200 Nicotine dependence, unspecified, uncomplicated: Secondary | ICD-10-CM

## 2015-09-06 DIAGNOSIS — R7989 Other specified abnormal findings of blood chemistry: Secondary | ICD-10-CM | POA: Insufficient documentation

## 2015-09-06 DIAGNOSIS — R0609 Other forms of dyspnea: Secondary | ICD-10-CM | POA: Insufficient documentation

## 2015-09-06 DIAGNOSIS — K759 Inflammatory liver disease, unspecified: Secondary | ICD-10-CM | POA: Insufficient documentation

## 2015-09-06 DIAGNOSIS — J309 Allergic rhinitis, unspecified: Secondary | ICD-10-CM | POA: Insufficient documentation

## 2015-09-06 DIAGNOSIS — R05 Cough: Secondary | ICD-10-CM | POA: Diagnosis not present

## 2015-09-06 DIAGNOSIS — J1108 Influenza due to unidentified influenza virus with specified pneumonia: Secondary | ICD-10-CM | POA: Diagnosis not present

## 2015-09-06 DIAGNOSIS — N529 Male erectile dysfunction, unspecified: Secondary | ICD-10-CM | POA: Insufficient documentation

## 2015-09-06 HISTORY — DX: Zoster without complications: B02.9

## 2015-09-06 LAB — POCT INFLUENZA A/B
Influenza A, POC: NEGATIVE
Influenza B, POC: POSITIVE — AB

## 2015-09-06 MED ORDER — AZITHROMYCIN 250 MG PO TABS: ORAL_TABLET | ORAL | Status: AC

## 2015-09-06 MED ORDER — OSELTAMIVIR PHOSPHATE 75 MG PO CAPS: 75.0000 mg | ORAL_CAPSULE | Freq: Two times a day (BID) | ORAL | Status: AC

## 2015-09-06 NOTE — Progress Notes (Signed)
Patient: Dylan Lee Male    DOB: 1955-10-16   59 y.o.   MRN: [id number] Visit Date: 09/06/2015  Today's Provider: Lelon Huh, MD   Chief Complaint  Patient presents with  . Cough  . Shortness of Breath  . Wheezing  . Generalized Body Aches   Subjective:      Patient has had sore throat and body aches since 09/04/2015. Has symptoms of cough, sob, wheezing, sinus congestion, chills, and sweats.   Cough This is a new problem. The current episode started in the past 7 days (09/04/2015). The problem occurs every few minutes. The cough is productive of sputum. Associated symptoms include chills, a fever, headaches, myalgias, nasal congestion, postnasal drip, a sore throat, shortness of breath, sweats and wheezing. Pertinent negatives include no chest pain, ear congestion, ear pain, heartburn, hemoptysis or rash. The symptoms are aggravated by exercise. Treatments tried: mucinex, nyquil. The treatment provided no relief. His past medical history is significant for bronchitis.       No Known Allergies Previous Medications   ASPIRIN 81 MG TABLET    Take 81 mg by mouth daily.   FUROSEMIDE (LASIX) 20 MG TABLET    Take 1 tablet by mouth daily.   FUROSEMIDE (LASIX) 80 MG TABLET    Take 80 mg by mouth daily.   LISINOPRIL-HYDROCHLOROTHIAZIDE (PRINZIDE,ZESTORETIC) 20-12.5 MG PER TABLET    Take 2 tablets by mouth daily.   LOSARTAN (COZAAR) 50 MG TABLET    Take 100 mg by mouth daily.   METOPROLOL SUCCINATE (TOPROL-XL) 100 MG 24 HR TABLET    Take 150 mg by mouth daily. Take with or immediately following a meal.   METOPROLOL SUCCINATE (TOPROL-XL) 25 MG 24 HR TABLET    Take 1 tablet by mouth daily.   RANITIDINE (ZANTAC) 150 MG TABLET    Take 150 mg by mouth at bedtime.   SPIRONOLACTONE (ALDACTONE) 25 MG TABLET    Take 12.5 mg by mouth daily.   SYMBICORT 160-4.5 MCG/ACT INHALER    INHALE TWO PUFFS BY MOUTH TWICE DAILY   VENLAFAXINE XR (EFFEXOR XR) 150 MG 24 HR CAPSULE    Take 1  tablet by mouth daily. for depression   VENLAFAXINE XR (EFFEXOR-XR) 150 MG 24 HR CAPSULE    Take 300 mg by mouth daily with breakfast.    Review of Systems  Constitutional: Positive for fever and chills. Negative for appetite change.  HENT: Positive for congestion, postnasal drip, sinus pressure and sore throat. Negative for ear pain.   Respiratory: Positive for cough, chest tightness, shortness of breath and wheezing. Negative for hemoptysis.   Cardiovascular: Negative for chest pain and palpitations.  Gastrointestinal: Negative for heartburn, nausea, vomiting and abdominal pain.  Musculoskeletal: Positive for myalgias.  Skin: Negative for rash.  Neurological: Positive for dizziness, light-headedness and headaches.    Social History  Substance Use Topics  . Smoking status: Former Smoker -- 1.00 packs/day for 20 years    Types: Cigars, Cigarettes    Quit date: 09/17/1988  . Smokeless tobacco: Never Used  . Alcohol Use: No   Objective:   BP 180/100 mmHg  Pulse 81  Temp(Src) 99.3 F (37.4 C) (Oral)  Resp 16  Ht 5' 9.5" (1.765 m)  Wt 259 lb (117.482 kg)  BMI 37.71 kg/m2  SpO2 96%  Physical Exam  General Appearance:    Alert, cooperative, no distress  HENT:   bilateral TM normal without fluid or infection, neck without nodes, throat  normal without erythema or exudate, sinuses nontender and nasal mucosa congested  Eyes:    PERRL, conjunctiva/corneas clear, EOM's intact       Lungs:     Mild diffuse expiratory wheeze, no rales, respirations unlabored, good air movement.   Heart:    Regular rate and rhythm  Neurologic:   Awake, alert, oriented x 3. No apparent focal neurological           defect.       Results for orders placed or performed in visit on 09/06/15  POCT Influenza A/B  Result Value Ref Range   Influenza A, POC Negative Negative   Influenza B, POC Positive (A) Negative        Assessment & Plan:     1. Cough  - POCT Influenza A/B  2. Influenza,  bronchopneumonia Cover for secondary bacterial infection in addition to  oseltamivir (TAMIFLU) 75 MG capsule; Take 1 capsule (75 mg total) by mouth 2 (two) times daily.  Dispense: 10 capsule; Refill: 0 - azithromycin (ZITHROMAX) 250 MG tablet; 2 by mouth today, then 1 daily for 4 days  Dispense: 6 tablet; Refill: 0  Call if symptoms change or if not rapidly improving.            Lelon Huh, MD  [facility]

## 2015-12-05 ENCOUNTER — Encounter: Payer: Self-pay | Admitting: Family Medicine

## 2015-12-05 ENCOUNTER — Ambulatory Visit (INDEPENDENT_AMBULATORY_CARE_PROVIDER_SITE_OTHER): Payer: Self-pay | Admitting: Family Medicine

## 2015-12-05 VITALS — BP 144/82 | HR 75 | Temp 98.7°F | Resp 18 | Wt 255.0 lb

## 2015-12-05 DIAGNOSIS — R32 Unspecified urinary incontinence: Secondary | ICD-10-CM | POA: Insufficient documentation

## 2015-12-05 DIAGNOSIS — R0609 Other forms of dyspnea: Secondary | ICD-10-CM

## 2015-12-05 DIAGNOSIS — I10 Essential (primary) hypertension: Secondary | ICD-10-CM

## 2015-12-05 DIAGNOSIS — J449 Chronic obstructive pulmonary disease, unspecified: Secondary | ICD-10-CM

## 2015-12-05 DIAGNOSIS — Z125 Encounter for screening for malignant neoplasm of prostate: Secondary | ICD-10-CM

## 2015-12-05 DIAGNOSIS — N3941 Urge incontinence: Secondary | ICD-10-CM

## 2015-12-05 DIAGNOSIS — F329 Major depressive disorder, single episode, unspecified: Secondary | ICD-10-CM

## 2015-12-05 DIAGNOSIS — F32A Depression, unspecified: Secondary | ICD-10-CM

## 2015-12-05 DIAGNOSIS — I420 Dilated cardiomyopathy: Secondary | ICD-10-CM

## 2015-12-05 DIAGNOSIS — I5022 Chronic systolic (congestive) heart failure: Secondary | ICD-10-CM

## 2015-12-05 DIAGNOSIS — Z95810 Presence of automatic (implantable) cardiac defibrillator: Secondary | ICD-10-CM

## 2015-12-05 LAB — POCT URINALYSIS DIPSTICK
Glucose, UA: NEGATIVE
KETONES UA: NEGATIVE
Leukocytes, UA: NEGATIVE
Nitrite, UA: NEGATIVE
PH UA: 6
RBC UA: NEGATIVE
SPEC GRAV UA: 1.025
Urobilinogen, UA: 2

## 2015-12-05 MED ORDER — TIOTROPIUM BROMIDE MONOHYDRATE 1.25 MCG/ACT IN AERS: 2.0000 | INHALATION_SPRAY | Freq: Every day | RESPIRATORY_TRACT | Status: DC

## 2015-12-05 NOTE — Progress Notes (Signed)
Patient: Dylan Lee Male    DOB: 01/20/1956   60 y.o.   MRN: [id number] Visit Date: 12/05/2015  Today's Provider: Lelon Huh, MD   Chief Complaint  Patient presents with  . Urinary Incontinence  . Shortness of Breath   Subjective:    Shortness of Breath This is a new problem. Episode onset: 2 months ago  The problem has been gradually worsening. The average episode lasts 5 minutes. Associated symptoms include headaches, leg swelling (occasionally), PND, a rash and wheezing. Pertinent negatives include no abdominal pain, chest pain, ear pain, fever, hemoptysis, leg pain, neck pain, orthopnea, rhinorrhea, sore throat, syncope or vomiting. The symptoms are aggravated by exercise (exertion). He has tried rest for the symptoms. The treatment provided moderate relief.  Patient states during episodes of shortness of breath after exertion he has some urinary incontinence. These symptoms started after he had his pacemaker/ defibrillator placed 2 months ago. He states he continues to smoke about 1 cigar a day. S  Patient Active Problem List   Diagnosis Date Noted  . Urinary incontinence 12/05/2015  . Compulsive tobacco user syndrome 09/06/2015  . Allergic rhinitis 09/06/2015  . Kidney stones 09/06/2015  . Erectile dysfunction 09/06/2015  . DOE (dyspnea on exertion) 09/06/2015  . COPD (chronic obstructive pulmonary disease) ([place]) 09/06/2015  . Bradycardia 06/03/2015  . Congestive heart failure ([place]) 03/02/2015  . Congestive dilated cardiomyopathy ([place]) 03/02/2015  . Pre-diabetes 01/14/2010  . Tinnitus 06/24/2009  . Psoriasis 04/15/2007  . External hemorrhoids without complication 123456  . Apnea, sleep 04/07/2007  . Pure hypercholesterolemia 04/07/2007  . Hepatitis C virus infection without hepatic coma 04/07/2007  . Benign neoplasm of large bowel 04/07/2007  . Rheumatoid arthritis ([place]) 03/24/2007  . Benign essential HTN 03/24/2007  . Esophageal reflux 03/24/2007  .  Depressive disorder 03/24/2007  . Asthma 03/24/2007  . Current tobacco use 03/24/2007   Past Medical History  Diagnosis Date  . CHF (congestive heart failure) ([place])   . Kidney stones   . Hepatitis C     Hep C treated   . History of chicken pox   . History of measles   . History of mumps   . Shingles 09/06/2015    Wt Readings from Last 3 Encounters:  12/05/15 255 lb (115.667 kg)  09/06/15 259 lb (117.482 kg)  06/03/15 255 lb 14.4 oz (116.075 kg)     Urinary Incontinence:  Patient reports he experienced urinary incontinence after exerting himself. He says once a week he has to take the trash out at home and walk up a hill. After walking up and down the hill he urinates on himself. He reports the urine comes out as a steady steam.    No Known Allergies Previous Medications   ASPIRIN 81 MG TABLET    Take 81 mg by mouth daily.   FUROSEMIDE (LASIX) 20 MG TABLET    Take 1 tablet by mouth daily.   FUROSEMIDE (LASIX) 80 MG TABLET    Take 80 mg by mouth daily.   LISINOPRIL-HYDROCHLOROTHIAZIDE (PRINZIDE,ZESTORETIC) 20-12.5 MG PER TABLET    Take 2 tablets by mouth daily.   LOSARTAN (COZAAR) 50 MG TABLET    Take 100 mg by mouth daily.   METOPROLOL SUCCINATE (TOPROL-XL) 100 MG 24 HR TABLET    Take 150 mg by mouth daily. Take with or immediately following a meal.   METOPROLOL SUCCINATE (TOPROL-XL) 25 MG 24 HR TABLET    Take 1 tablet by mouth  daily.   SPIRONOLACTONE (ALDACTONE) 25 MG TABLET    Take 12.5 mg by mouth daily.   SYMBICORT 160-4.5 MCG/ACT INHALER    INHALE TWO PUFFS BY MOUTH TWICE DAILY   VENLAFAXINE XR (EFFEXOR XR) 150 MG 24 HR CAPSULE    Take 1 tablet by mouth daily. for depression    Review of Systems  Constitutional: Positive for diaphoresis. Negative for fever, chills, appetite change and fatigue.  HENT: Negative for ear pain, rhinorrhea and sore throat.   Respiratory: Positive for shortness of breath and wheezing. Negative for hemoptysis and chest tightness.     Cardiovascular: Positive for leg swelling (occasionally) and PND. Negative for chest pain, palpitations, orthopnea and syncope.  Gastrointestinal: Negative for nausea, vomiting and abdominal pain.  Genitourinary: Positive for frequency. Negative for dysuria, urgency, hematuria, flank pain, decreased urine volume, discharge, penile swelling, scrotal swelling, enuresis, difficulty urinating, genital sores, penile pain and testicular pain.       Urinary incontinence  Musculoskeletal: Positive for back pain. Negative for neck pain.  Skin: Positive for rash.  Neurological: Positive for headaches.    Social History  Substance Use Topics  . Smoking status: Smoked -- 1.00 packs/day for 20 years, now 1 cigar a day    Types: Cigars, Cigarettes    Quit date: 09/17/1988  . Smokeless tobacco: Never Used  . Alcohol Use: No   Objective:   BP 144/82 mmHg  Pulse 75  Temp(Src) 98.7 F (37.1 C) (Oral)  Resp 18  Wt 255 lb (115.667 kg)  SpO2 97%  Physical Exam   General Appearance:    Alert, cooperative, no distress  Eyes:    PERRL, conjunctiva/corneas clear, EOM's intact       Lungs:     Scattered wheezes, no focal rales respirations unlabored  Heart:    Regular rate and rhythm, II/VI systolic murmur.   Neurologic:   Awake, alert, oriented x 3. No apparent focal neurological           defect.       Results for orders placed or performed in visit on 12/05/15  CBC  Result Value Ref Range   WBC 5.4 3.4 - 10.8 x10E3/uL   RBC 5.37 4.14 - 5.80 x10E6/uL   Hemoglobin 17.0 12.6 - 17.7 g/dL   Hematocrit 49.0 37.5 - 51.0 %   MCV 91 79 - 97 fL   MCH 31.7 26.6 - 33.0 pg   MCHC 34.7 31.5 - 35.7 g/dL   RDW 13.5 12.3 - 15.4 %   Platelets 194 150 - 379 x10E3/uL  Comprehensive metabolic panel  Result Value Ref Range   Glucose 95 65 - 99 mg/dL   BUN 19 8 - 27 mg/dL   Creatinine, Ser 1.24 0.76 - 1.27 mg/dL   GFR calc non Af Amer 63 >59 mL/min/1.73   GFR calc Af Amer 73 >59 mL/min/1.73    BUN/Creatinine Ratio 15 10 - 22   Sodium 144 134 - 144 mmol/L   Potassium 5.0 3.5 - 5.2 mmol/L   Chloride 100 96 - 106 mmol/L   CO2 26 18 - 29 mmol/L   Calcium 9.4 8.6 - 10.2 mg/dL   Total Protein 7.1 6.0 - 8.5 g/dL   Albumin 4.3 3.6 - 4.8 g/dL   Globulin, Total 2.8 1.5 - 4.5 g/dL   Albumin/Globulin Ratio 1.5 1.2 - 2.2   Bilirubin Total 0.5 0.0 - 1.2 mg/dL   Alkaline Phosphatase 64 39 - 117 IU/L   AST 45 (H) 0 -  40 IU/L   ALT 52 (H) 0 - 44 IU/L  PSA  Result Value Ref Range   Prostate Specific Ag, Serum 0.6 0.0 - 4.0 ng/mL  Brain natriuretic peptide  Result Value Ref Range   BNP 500.2 (H) 0.0 - 100.0 pg/mL  POCT urinalysis dipstick  Result Value Ref Range   Color, UA amber    Clarity, UA clear    Glucose, UA negative    Bilirubin, UA small    Ketones, UA negative    Spec Grav, UA 1.025    Blood, UA negative    pH, UA 6.0    Protein, UA Trace    Urobilinogen, UA 2.0    Nitrite, UA negative    Leukocytes, UA Negative Negative         Assessment & Plan:     1. DOE (dyspnea on exertion) Likely multifactorial with history of severe COPD and significant CHF. Add Spiriva. Is already being aggressively diuresed, but BNP is still elevated. Obtain chest XR to evaluate for pulmonary edema.  - CBC - Comprehensive metabolic panel - Brain natriuretic peptide  2. Chronic obstructive pulmonary disease, unspecified COPD type (HCC) Add Spiriva - Tiotropium Bromide Monohydrate (SPIRIVA RESPIMAT) 1.25 MCG/ACT AERS; Inhale 2 puffs into the lungs daily.  Dispense: 4 g; Refill: 0  3. Urge incontinence of urine No sign of UTI. This is likely adverse effect of diuretics.  - POCT urinalysis dipstick  4. Prostate cancer screening  - PSA  5. Benign essential HTN Stable. Continue current medications.    6. Congestive dilated cardiomyopathy ([place])   7. Chronic systolic congestive heart failure (HCC) Progressive, now with implantable defibrillator. Continue current medications.   Continue routine follow up Dr. Clayborn Bigness  8. Depressive disorder Aggravated by chronic medical conditions and physical limitations. Continue Effexor for now.   9. Cardiac defibrillator in situ          Lelon Huh, MD  [facility]

## 2015-12-06 ENCOUNTER — Encounter: Payer: Self-pay | Admitting: Family Medicine

## 2015-12-06 ENCOUNTER — Telehealth: Payer: Self-pay | Admitting: *Deleted

## 2015-12-06 DIAGNOSIS — Z95810 Presence of automatic (implantable) cardiac defibrillator: Secondary | ICD-10-CM | POA: Insufficient documentation

## 2015-12-06 LAB — COMPREHENSIVE METABOLIC PANEL
ALBUMIN: 4.3 g/dL (ref 3.6–4.8)
ALK PHOS: 64 IU/L (ref 39–117)
ALT: 52 IU/L — ABNORMAL HIGH (ref 0–44)
AST: 45 IU/L — AB (ref 0–40)
Albumin/Globulin Ratio: 1.5 (ref 1.2–2.2)
BILIRUBIN TOTAL: 0.5 mg/dL (ref 0.0–1.2)
BUN / CREAT RATIO: 15 (ref 10–22)
BUN: 19 mg/dL (ref 8–27)
CHLORIDE: 100 mmol/L (ref 96–106)
CO2: 26 mmol/L (ref 18–29)
Calcium: 9.4 mg/dL (ref 8.6–10.2)
Creatinine, Ser: 1.24 mg/dL (ref 0.76–1.27)
GFR calc Af Amer: 73 mL/min/{1.73_m2} (ref >59)
GFR calc non Af Amer: 63 mL/min/{1.73_m2} (ref >59)
GLOBULIN, TOTAL: 2.8 g/dL (ref 1.5–4.5)
Glucose: 95 mg/dL (ref 65–99)
POTASSIUM: 5 mmol/L (ref 3.5–5.2)
SODIUM: 144 mmol/L (ref 134–144)
Total Protein: 7.1 g/dL (ref 6.0–8.5)

## 2015-12-06 LAB — CBC
HEMATOCRIT: 49 % (ref 37.5–51.0)
HEMOGLOBIN: 17 g/dL (ref 12.6–17.7)
MCH: 31.7 pg (ref 26.6–33.0)
MCHC: 34.7 g/dL (ref 31.5–35.7)
MCV: 91 fL (ref 79–97)
Platelets: 194 10*3/uL (ref 150–379)
RBC: 5.37 x10E6/uL (ref 4.14–5.80)
RDW: 13.5 % (ref 12.3–15.4)
WBC: 5.4 10*3/uL (ref 3.4–10.8)

## 2015-12-06 LAB — PSA: PROSTATE SPECIFIC AG, SERUM: 0.6 ng/mL (ref 0.0–4.0)

## 2015-12-06 LAB — BRAIN NATRIURETIC PEPTIDE: BNP: 500.2 pg/mL — AB (ref 0.0–100.0)

## 2015-12-06 NOTE — Telephone Encounter (Signed)
Patient was notified of results. Patient is agreeable to x-ray.

## 2015-12-06 NOTE — Telephone Encounter (Signed)
-----   Message from Birdie Sons, MD sent at 12/06/2015  3:01 PM EDT ----- He does have elevated BNP which suggests weak heart and fluid retention. He needs to have chest XR PA and lateral to see if he has fluid building up on lungs.

## 2015-12-07 ENCOUNTER — Ambulatory Visit
Admission: RE | Admit: 2015-12-07 | Discharge: 2015-12-07 | Disposition: A | Discharge status: Home / Self Care | Payer: 59 | Source: Ambulatory Visit | Attending: Family Medicine | Admitting: Family Medicine

## 2015-12-07 ENCOUNTER — Other Ambulatory Visit: Payer: Self-pay | Admitting: Family Medicine

## 2015-12-07 DIAGNOSIS — R0609 Other forms of dyspnea: Secondary | ICD-10-CM

## 2015-12-09 ENCOUNTER — Telehealth: Payer: Self-pay | Admitting: Family Medicine

## 2015-12-09 NOTE — Telephone Encounter (Signed)
Pt's wife called for his CXR results.  Please call [phone]  Thanks Con Memos

## 2015-12-09 NOTE — Telephone Encounter (Signed)
XR looks good, no sign of fluid on lungs or heart failure. Shortness of breath should improve after being on the new inhaler for about a week.

## 2015-12-09 NOTE — Telephone Encounter (Signed)
Mrs. Whoolery advised as directed below.   Thanks,   -Mickel Baas

## 2015-12-09 NOTE — Telephone Encounter (Signed)
Please advise results? 

## 2015-12-19 ENCOUNTER — Telehealth: Payer: Self-pay | Admitting: Family Medicine

## 2015-12-19 NOTE — Telephone Encounter (Signed)
i have not seen any disability forms

## 2015-12-19 NOTE — Telephone Encounter (Signed)
Patient stated that he was given the form back with his avs at [facility] and he has the form at home. Pt will bring form back by office.

## 2015-12-19 NOTE — Telephone Encounter (Signed)
Pease advise.

## 2015-12-19 NOTE — Telephone Encounter (Signed)
Pt calling to see if his disability forms are ready to be picked up, pt states he brought the forms up here over a week ago to be filled out. Please call to let pt know the status on forms.  CB# [phone]  Thanks CC

## 2015-12-20 ENCOUNTER — Telehealth: Payer: Self-pay | Admitting: Family Medicine

## 2015-12-20 DIAGNOSIS — J449 Chronic obstructive pulmonary disease, unspecified: Secondary | ICD-10-CM

## 2015-12-20 MED ORDER — TIOTROPIUM BROMIDE MONOHYDRATE 1.25 MCG/ACT IN AERS: 2.0000 | INHALATION_SPRAY | Freq: Every day | RESPIRATORY_TRACT | Status: DC

## 2015-12-20 NOTE — Telephone Encounter (Signed)
Pt used the Spriva sample and wants to know if you could call him in a prescription.  He said it worked for him.  He uses Paediatric nurse in [place]

## 2015-12-20 NOTE — Telephone Encounter (Signed)
Please review. Thanks!  

## 2016-01-16 ENCOUNTER — Other Ambulatory Visit: Payer: Self-pay | Admitting: Family Medicine

## 2016-02-14 ENCOUNTER — Other Ambulatory Visit: Payer: Self-pay | Admitting: Family Medicine

## 2016-03-27 ENCOUNTER — Other Ambulatory Visit: Payer: Self-pay | Admitting: Family Medicine

## 2016-04-18 ENCOUNTER — Other Ambulatory Visit: Payer: Self-pay | Admitting: Internal Medicine

## 2016-04-18 DIAGNOSIS — J449 Chronic obstructive pulmonary disease, unspecified: Secondary | ICD-10-CM

## 2016-05-01 ENCOUNTER — Other Ambulatory Visit: Payer: Self-pay | Admitting: Family Medicine

## 2016-05-17 ENCOUNTER — Ambulatory Visit (HOSPITAL_COMMUNITY): Payer: Disability Insurance

## 2016-05-17 ENCOUNTER — Ambulatory Visit
Admission: RE | Admit: 2016-05-17 | Discharge: 2016-05-17 | Disposition: A | Discharge status: Home / Self Care | Payer: Disability Insurance | Source: Ambulatory Visit | Attending: Family Medicine | Admitting: Family Medicine

## 2016-05-17 ENCOUNTER — Other Ambulatory Visit: Payer: Self-pay | Admitting: Family Medicine

## 2016-05-17 DIAGNOSIS — M170 Bilateral primary osteoarthritis of knee: Secondary | ICD-10-CM | POA: Insufficient documentation

## 2016-05-17 DIAGNOSIS — M1990 Unspecified osteoarthritis, unspecified site: Secondary | ICD-10-CM

## 2016-05-17 DIAGNOSIS — J449 Chronic obstructive pulmonary disease, unspecified: Secondary | ICD-10-CM

## 2016-05-17 MED ORDER — ALBUTEROL SULFATE (2.5 MG/3ML) 0.083% IN NEBU
2.5000 mg | INHALATION_SOLUTION | Freq: Once | RESPIRATORY_TRACT | Status: AC
  Administered 2016-05-17: 2.5 mg via RESPIRATORY_TRACT
  Filled 2016-05-17: qty 3

## 2016-07-11 IMAGING — CR DG CHEST 2V
1 series · 2 of 2 positions shown · non-contrast
Comparison: 12/22/2008

CLINICAL DATA: Status post pacemaker placement.

EXAM:
CHEST  2 VIEW

[Series 1: dg chest 2 view · 0.14mm/px · 2 of 2 slices shown]
[im 1/2]
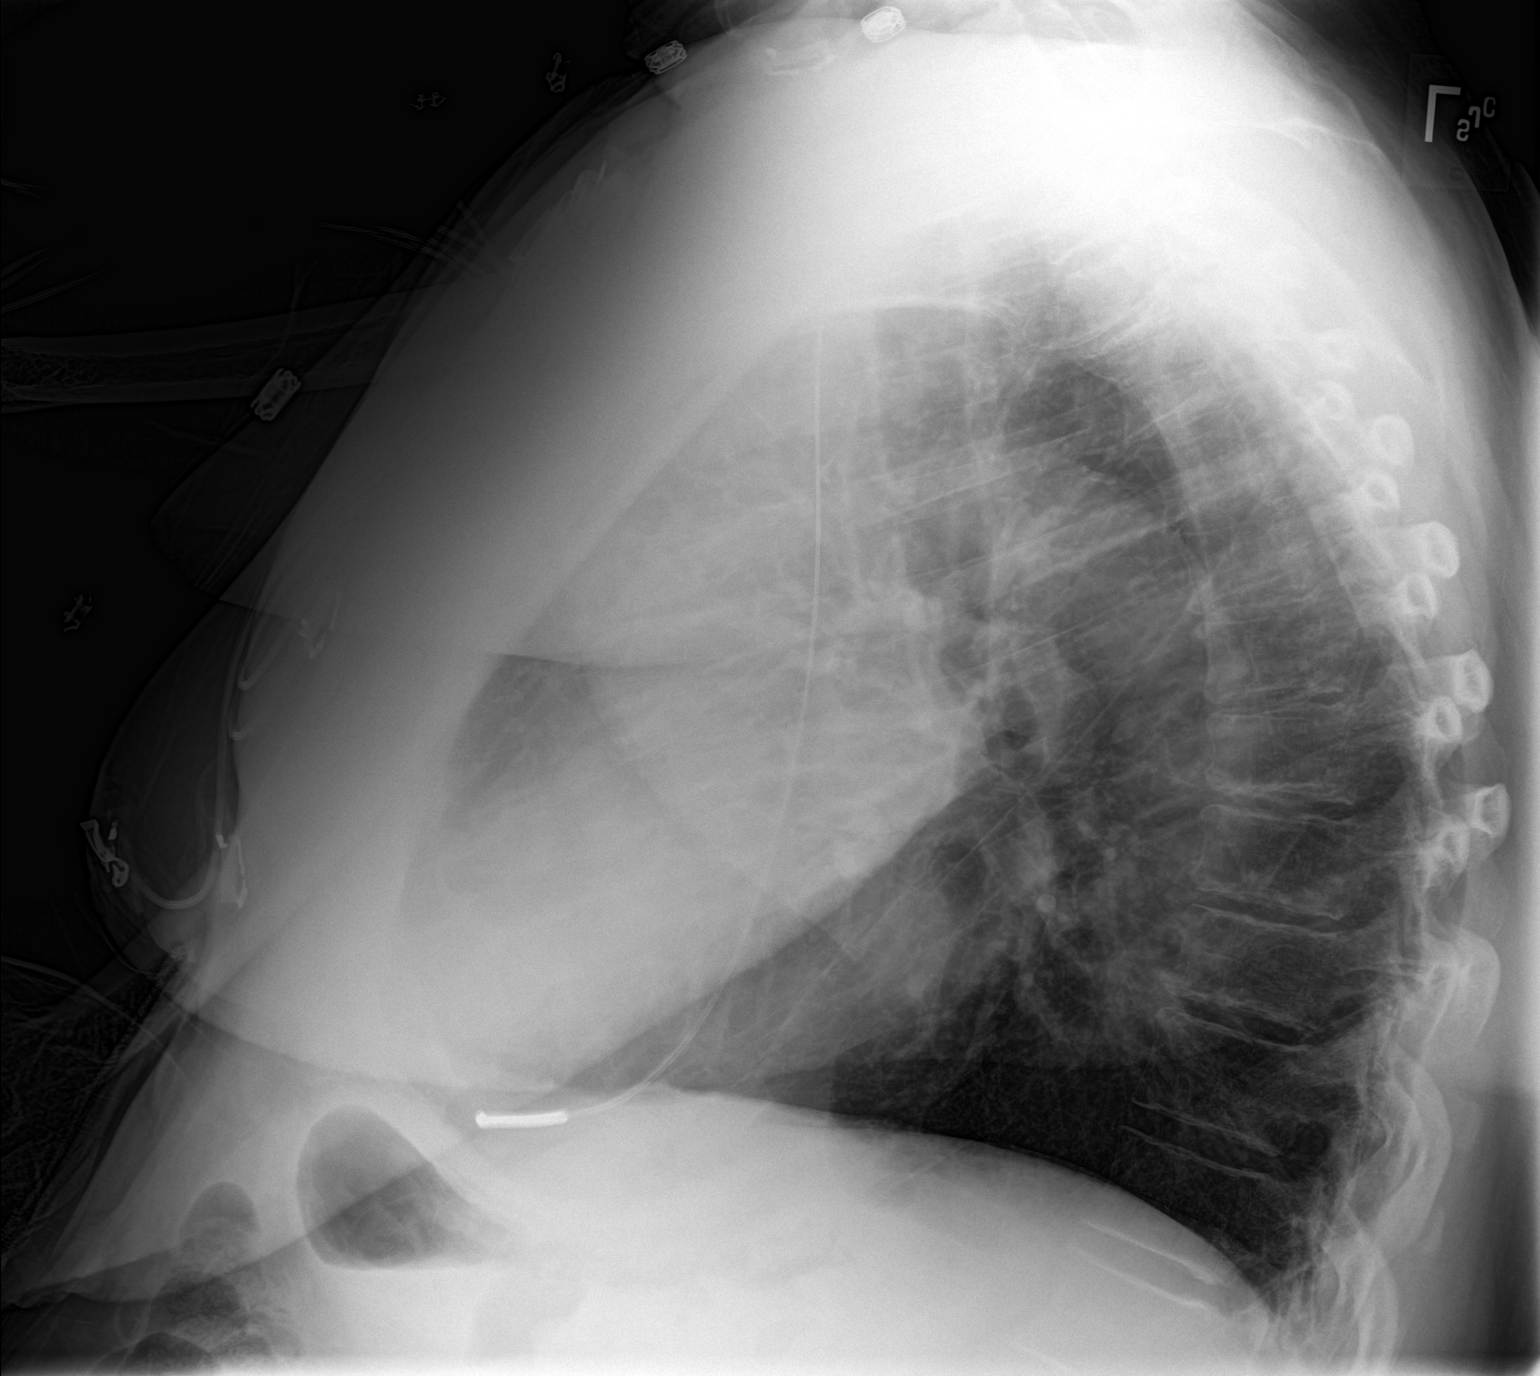
[im 2/2]
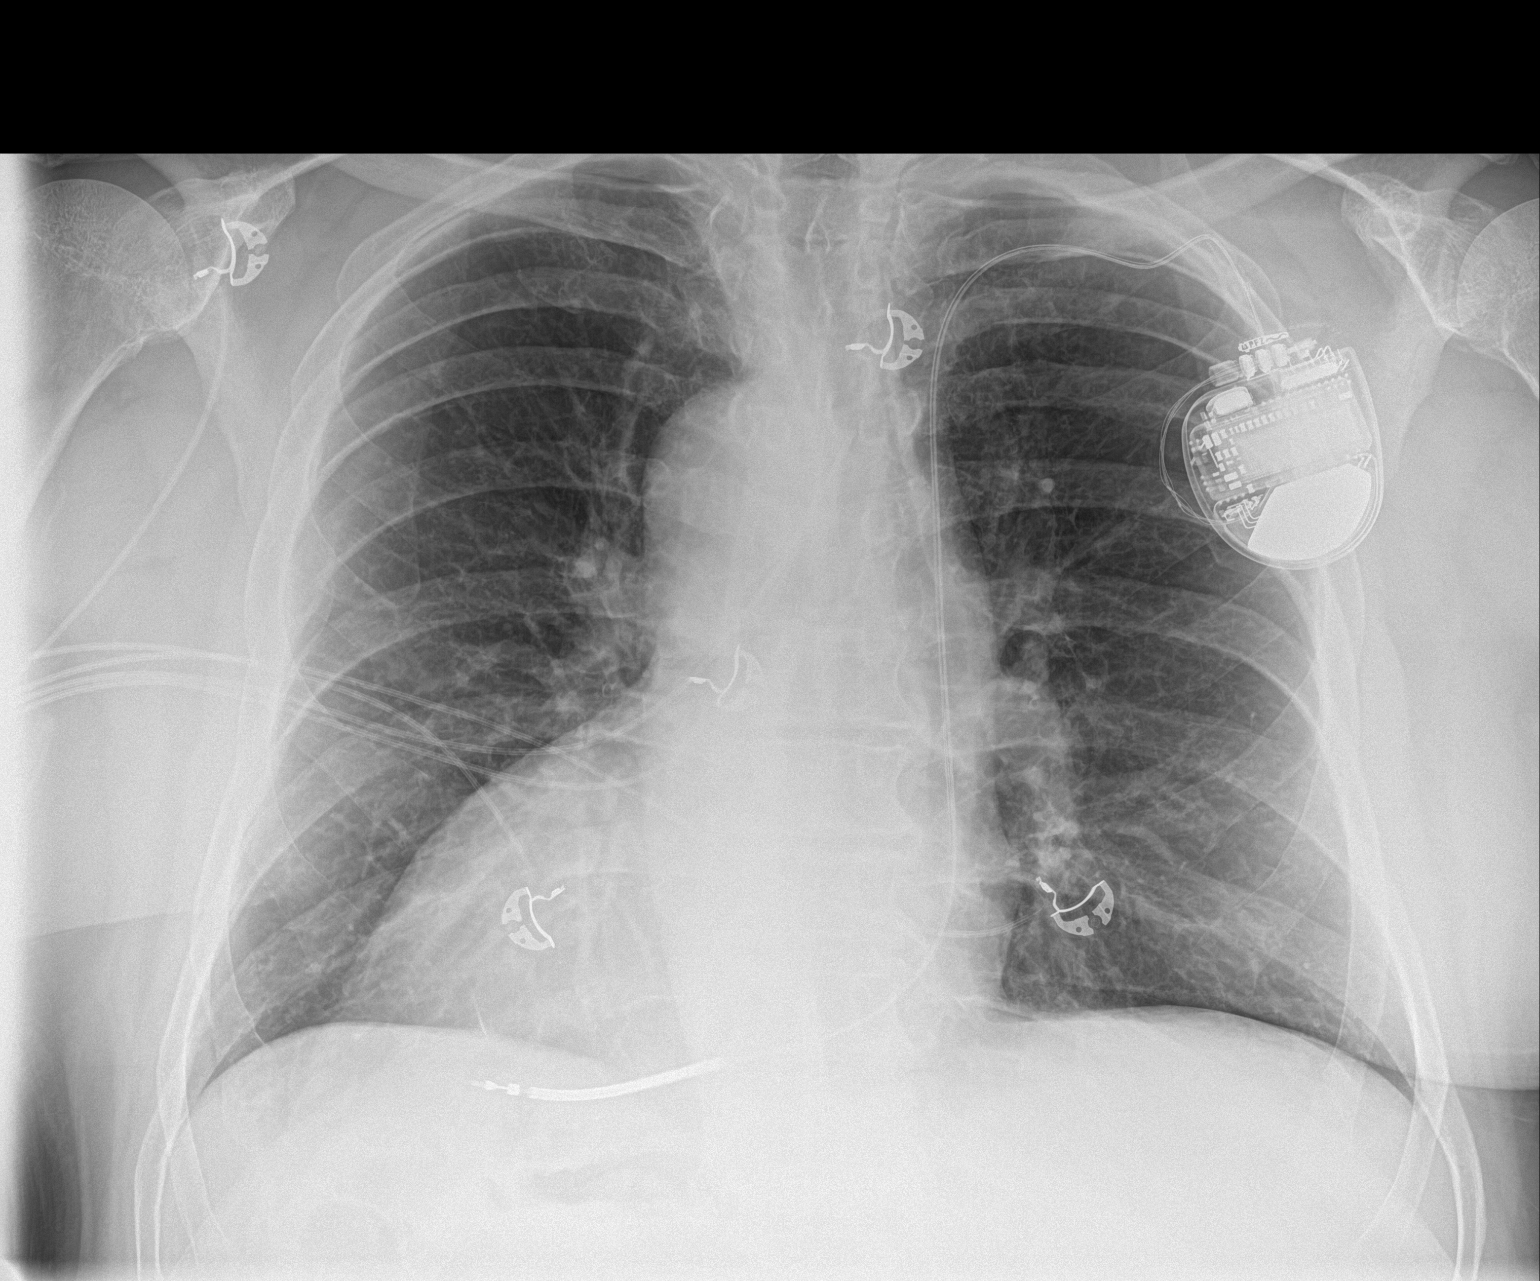

[2 of 2 positions shown; findings below may reference images not displayed]

FINDINGS: There is no focal parenchymal opacity. There is no pleural effusion
or pneumothorax. The heart and mediastinal contours are
unremarkable. Interval placement of a single lead cardiac pacemaker
with the power pack projecting over the right chest wall.

The osseous structures are unremarkable.
IMPRESSION: No active cardiopulmonary disease.

Interval placement of a pacemaker without complication.

## 2016-12-22 ENCOUNTER — Other Ambulatory Visit: Payer: Self-pay | Admitting: Family Medicine

## 2017-02-01 ENCOUNTER — Other Ambulatory Visit: Payer: Self-pay | Admitting: Family Medicine

## 2017-09-27 ENCOUNTER — Other Ambulatory Visit: Payer: Self-pay | Admitting: Family Medicine

## 2017-11-08 ENCOUNTER — Ambulatory Visit
Admission: RE | Admit: 2017-11-08 | Discharge: 2017-11-08 | Disposition: A | Discharge status: Home / Self Care | Payer: Self-pay | Source: Ambulatory Visit | Attending: Family Medicine | Admitting: Family Medicine

## 2017-11-08 ENCOUNTER — Other Ambulatory Visit: Payer: Self-pay | Admitting: Family Medicine

## 2017-11-08 ENCOUNTER — Encounter: Payer: Self-pay | Admitting: Family Medicine

## 2017-11-08 ENCOUNTER — Ambulatory Visit: Payer: Self-pay | Admitting: Family Medicine

## 2017-11-08 VITALS — BP 170/102 | HR 82 | Temp 98.6°F | Resp 16 | Wt 247.0 lb

## 2017-11-08 DIAGNOSIS — R0601 Orthopnea: Secondary | ICD-10-CM

## 2017-11-08 DIAGNOSIS — R05 Cough: Secondary | ICD-10-CM

## 2017-11-08 DIAGNOSIS — I10 Essential (primary) hypertension: Secondary | ICD-10-CM

## 2017-11-08 DIAGNOSIS — R059 Cough, unspecified: Secondary | ICD-10-CM

## 2017-11-08 DIAGNOSIS — I509 Heart failure, unspecified: Secondary | ICD-10-CM

## 2017-11-08 DIAGNOSIS — J40 Bronchitis, not specified as acute or chronic: Secondary | ICD-10-CM

## 2017-11-08 MED ORDER — AZITHROMYCIN 250 MG PO TABS: ORAL_TABLET | ORAL | 0 refills | Status: AC

## 2017-11-08 MED ORDER — PREDNISONE 20 MG PO TABS: 20.0000 mg | ORAL_TABLET | Freq: Two times a day (BID) | ORAL | 0 refills | Status: AC

## 2017-11-08 MED ORDER — SPIRONOLACTONE 25 MG PO TABS: 12.5000 mg | ORAL_TABLET | Freq: Every day | ORAL | 1 refills | Status: DC

## 2017-11-08 NOTE — Patient Instructions (Signed)
Go to the [facility] on [address] for chest Xray  

## 2017-11-08 NOTE — Progress Notes (Signed)
Patient: Dylan Lee Male    DOB: March 07, 1956   62 y.o.   MRN: [id number] Visit Date: 11/08/2017  Today's Provider: Lelon Huh, MD   Chief Complaint  Patient presents with  . Cough    x 2 weeks   Subjective:    Cough  This is a new problem. Episode onset: 2 weeks ago. The problem has been unchanged. The cough is productive of sputum (clear colored sputum). Associated symptoms include ear congestion, ear pain, headaches, nasal congestion, postnasal drip, rhinorrhea and wheezing. Pertinent negatives include no chest pain, chills, fever, hemoptysis, myalgias, sore throat or sweats. Treatments tried: OTC Cold and cough medication. The treatment provided no relief.   He also reports he has had increasing shortness of breath with exertion for a few months, and increased swelling of his legs. He has long established history systolic heart failure. He has been out of lisinopril-hctz and spironolactone prescribed by his cardiologist, who he has not seen since 2017.     No Known Allergies   Current Outpatient Medications: (verfied by phone after patient returned home) .  aspirin 81 MG tablet, Take 81 mg by mouth daily., Disp: , Rfl:  .  furosemide (LASIX) 80 MG tablet, Take 80 mg by mouth 2 (two) times daily. , Disp: , Rfl:  .  losartan (COZAAR) 50 MG tablet, Take 100 mg by mouth daily., Disp: , Rfl:  .  metoprolol succinate (TOPROL-XL) 25 MG 24 hr tablet, Take 2 tablets by mouth daily. , Disp: , Rfl:  .  SYMBICORT 160-4.5 MCG/ACT inhaler, INHALE TWO PUFFS BY MOUTH TWICE DAILY, Disp: 1 Inhaler, Rfl: 5 .  venlafaxine XR (EFFEXOR-XR) 150 MG 24 hr capsule, TAKE 1 CAPSULE BY MOUTH ONCE DAILY FOR DEPRESSION, Disp: 30 capsule, Rfl: 0 .  spironolactone (ALDACTONE) 25 MG tablet, Take 12.5 mg by mouth daily., Disp: , Rfl: (not taking) .  Tiotropium Bromide Monohydrate (SPIRIVA RESPIMAT) 1.25 MCG/ACT AERS, Inhale 2 puffs into the lungs daily. (Patient not taking: Reported on 11/08/2017),  Disp: 4 g, Rfl: 12  Review of Systems  Constitutional: Negative for chills and fever.  HENT: Positive for congestion, ear pain, postnasal drip and rhinorrhea. Negative for sore throat.   Respiratory: Positive for cough and wheezing. Negative for hemoptysis.   Cardiovascular: Negative for chest pain.  Musculoskeletal: Negative for myalgias.  Neurological: Positive for headaches.    Social History   Tobacco Use  . Smoking status: Current Some Day Smoker    Packs/day: 1.00    Years: 20.00    Pack years: 20.00    Types: Cigars    Last attempt to quit: 09/17/1988    Years since quitting: 29.1  . Smokeless tobacco: Never Used  Substance Use Topics  . Alcohol use: No    Alcohol/week: 0.0 oz   Objective:   BP (!) 170/102 (BP Location: Right Arm, Cuff Size: Large)   Pulse 82   Temp 98.6 F (37 C) (Oral)   Resp 16   Wt 247 lb (112 kg)   SpO2 97% Comment: room air  BMI 35.95 kg/m  Vitals:   11/08/17 1051 11/08/17 1057  BP: (!) 166/100 (!) 170/102  Pulse: 82   Resp: 16   Temp: 98.6 F (37 C)   TempSrc: Oral   SpO2: 97%   Weight: 247 lb (112 kg)      Physical Exam   General Appearance:    Alert, cooperative, no distress  Eyes:    PERRL,  conjunctiva/corneas clear, EOM's intact       Lungs:     Clear to auscultation bilaterally, respirations unlabored  Heart:    Regular rate and rhythm  Neurologic:   Awake, alert, oriented x 3. No apparent focal neurological           defect.       Dg Chest 2 View  Result Date: 11/08/2017 CLINICAL DATA:  Productive cough and orthopnea x2 weeks. EXAM: CHEST  2 VIEW COMPARISON:  12/07/2015 FINDINGS: The heart size and mediastinal contours are within normal limits. Minimal atherosclerosis of the aorta at the arch without radiographic evidence of aneurysm. Right-sided ICD device is redemonstrated with lead in the right ventricle. Both lungs are clear. The visualized skeletal structures are unremarkable. IMPRESSION: No active cardiopulmonary  disease. Electronically Signed   By: Ashley Royalty M.D.   On: 11/08/2017 14:04       Assessment & Plan:     1. Benign essential HTN Uncontrolled, but not compliant with medications. Start back on aldactone and continue metoprolol. Return in 2-3 weeds for BP check.  - EKG 12-Lead - spironolactone (ALDACTONE) 25 MG tablet; Take 0.5 tablets (12.5 mg total) by mouth daily.  Dispense: 30 tablet; Refill: 1  2. Cough  - DG Chest 2 View; Future  3. Orthopnea No sign of CHF exacerbation on xray. Start prescription for azithromycin as below.  - DG Chest 2 View; Future  4. Bronchitis  - azithromycin (ZITHROMAX) 250 MG tablet; 2 by mouth today, then 1 daily for 4 days  Dispense: 6 tablet; Refill: 0 - predniSONE (DELTASONE) 20 MG tablet; Take 1 tablet (20 mg total) by mouth 2 (two) times daily with a meal for 5 days.  Dispense: 10 tablet; Refill: 0  5. Congestive heart failure, unspecified HF chronicity, unspecified heart failure type ([place]) Start back on spironolactone and continue metoprolol.        Lelon Huh, MD  [facility]

## 2017-11-11 ENCOUNTER — Telehealth: Payer: Self-pay | Admitting: Family Medicine

## 2017-11-11 NOTE — Telephone Encounter (Signed)
Please check with patient and make sure he is improving on azithromycin and prednisone. Needs to set up a follow up appointment in about 3-4 weeks to make sure BP and swelling are improving.

## 2017-11-12 NOTE — Telephone Encounter (Signed)
Advised patient as below. Appt was scheduled.

## 2017-11-12 NOTE — Telephone Encounter (Signed)
Left message to call back  

## 2017-12-13 ENCOUNTER — Other Ambulatory Visit: Payer: Self-pay | Admitting: Family Medicine

## 2017-12-13 ENCOUNTER — Ambulatory Visit: Payer: Self-pay | Admitting: Family Medicine

## 2017-12-13 DIAGNOSIS — I10 Essential (primary) hypertension: Secondary | ICD-10-CM

## 2017-12-13 MED ORDER — SPIRONOLACTONE 25 MG PO TABS: 12.5000 mg | ORAL_TABLET | Freq: Every day | ORAL | 0 refills | Status: DC

## 2017-12-13 NOTE — Telephone Encounter (Signed)
Have sent refills. Needs to schedule follow up in the next week or two about for blood pressure.

## 2017-12-13 NOTE — Progress Notes (Deleted)
       Patient: Dylan Lee Male    DOB: Sep 08, 1956   62 y.o.   MRN: [id number] Visit Date: 12/13/2017  Today's Provider: Lelon Huh, MD   No chief complaint on file.  Subjective:    HPI   Hypertension, follow-up:  BP Readings from Last 3 Encounters:  11/08/17 (!) 170/102  12/05/15 (!) 144/82  09/06/15 (!) 180/100    He was last seen for hypertension 1 months ago.  BP at that visit was 170/102. Management since that visit includes; started back on aldactone and continue metoprolol. Return in 2-3 weeks for Bp check.He reports {excellent/good/fair/poor:19665} compliance with treatment. He {ACTION; IS/IS NOM:[id number]} having side effects. *** He {is/is not:9024} exercising. He {is/is not:9024} adherent to low salt diet.   Outside blood pressures are ***. He is experiencing {Symptoms; cardiac:12860}.  Patient denies {Symptoms; cardiac:12860}.   Cardiovascular risk factors include {cv risk factors:510}.  Use of agents associated with hypertension: {bp agents assoc with hypertension:511::"none"}.   ------------------------------------------------------------------------  Congestive heart failure, unspecified HF chronicity, unspecified heart failure type ([place]) From 11/08/2017-Started back on spironolactone and continue metoprolol.    No Known Allergies   Current Outpatient Medications:  .  aspirin 81 MG tablet, Take 81 mg by mouth daily., Disp: , Rfl:  .  furosemide (LASIX) 80 MG tablet, Take 80 mg by mouth 2 (two) times daily. , Disp: , Rfl:  .  losartan (COZAAR) 50 MG tablet, Take 100 mg by mouth daily., Disp: , Rfl:  .  metoprolol succinate (TOPROL-XL) 25 MG 24 hr tablet, Take 2 tablets by mouth daily. , Disp: , Rfl:  .  spironolactone (ALDACTONE) 25 MG tablet, Take 0.5 tablets (12.5 mg total) by mouth daily., Disp: 30 tablet, Rfl: 1 .  SYMBICORT 160-4.5 MCG/ACT inhaler, INHALE TWO PUFFS BY MOUTH TWICE DAILY, Disp: 1 Inhaler, Rfl: 5 .  Tiotropium Bromide  Monohydrate (SPIRIVA RESPIMAT) 1.25 MCG/ACT AERS, Inhale 2 puffs into the lungs daily. (Patient not taking: Reported on 11/08/2017), Disp: 4 g, Rfl: 12 .  venlafaxine XR (EFFEXOR-XR) 150 MG 24 hr capsule, TAKE 1 CAPSULE BY MOUTH ONCE DAILY FOR DEPRESSION PATIENT NEEDS TO SCHEDULE OFFICE VISIT FOR FOLLOW UP, Disp: 30 capsule, Rfl: 0  Review of Systems  Constitutional: Negative for appetite change, chills and fever.  Respiratory: Negative for chest tightness, shortness of breath and wheezing.   Cardiovascular: Negative for chest pain and palpitations.  Gastrointestinal: Negative for abdominal pain, nausea and vomiting.    Social History   Tobacco Use  . Smoking status: Current Some Day Smoker    Packs/day: 1.00    Years: 20.00    Pack years: 20.00    Types: Cigars    Last attempt to quit: 09/17/1988    Years since quitting: 29.2  . Smokeless tobacco: Never Used  Substance Use Topics  . Alcohol use: No    Alcohol/week: 0.0 oz   Objective:   There were no vitals taken for this visit. There were no vitals filed for this visit.   Physical Exam      Assessment & Plan:           Lelon Huh, MD  [facility]

## 2017-12-13 NOTE — Telephone Encounter (Signed)
Left message advising pt to cb and schedule ov.

## 2017-12-13 NOTE — Telephone Encounter (Signed)
Patient requesting refill for his venlafaxine XR (EFFEXOR-XR) 150 MG 24 hr capsule   spironolactone (ALDACTONE) 25 MG tablet    [facility] [place]

## 2018-03-27 ENCOUNTER — Encounter: Payer: Self-pay | Admitting: Family Medicine

## 2018-03-27 ENCOUNTER — Ambulatory Visit (INDEPENDENT_AMBULATORY_CARE_PROVIDER_SITE_OTHER): Payer: Medicare HMO | Admitting: Family Medicine

## 2018-03-27 VITALS — BP 138/88 | HR 67 | Temp 98.5°F | Resp 16 | Wt 233.0 lb

## 2018-03-27 DIAGNOSIS — I509 Heart failure, unspecified: Secondary | ICD-10-CM

## 2018-03-27 DIAGNOSIS — I2510 Atherosclerotic heart disease of native coronary artery without angina pectoris: Secondary | ICD-10-CM | POA: Insufficient documentation

## 2018-03-27 DIAGNOSIS — R0600 Dyspnea, unspecified: Secondary | ICD-10-CM | POA: Diagnosis not present

## 2018-03-27 DIAGNOSIS — G4733 Obstructive sleep apnea (adult) (pediatric): Secondary | ICD-10-CM

## 2018-03-27 DIAGNOSIS — I10 Essential (primary) hypertension: Secondary | ICD-10-CM

## 2018-03-27 DIAGNOSIS — J449 Chronic obstructive pulmonary disease, unspecified: Secondary | ICD-10-CM | POA: Diagnosis not present

## 2018-03-27 MED ORDER — BUDESONIDE-FORMOTEROL FUMARATE 160-4.5 MCG/ACT IN AERO: 2.0000 | INHALATION_SPRAY | Freq: Two times a day (BID) | RESPIRATORY_TRACT | 3 refills | Status: DC

## 2018-03-27 MED ORDER — TIOTROPIUM BROMIDE MONOHYDRATE 1.25 MCG/ACT IN AERS: 2.0000 | INHALATION_SPRAY | Freq: Every day | RESPIRATORY_TRACT | 3 refills | Status: DC

## 2018-03-27 NOTE — Progress Notes (Addendum)
Patient: Dylan Lee Male    DOB: 1956-08-16   62 y.o.   MRN: [id number] Visit Date: 03/27/2018  Today's Provider: Lelon Huh, MD   Chief Complaint  Patient presents with  . Sleep Apnea  . Hypertension   Subjective:    HPI Follow up of Sleep Apnea: Patient comes in today requesting to have another sleep study. His last one was done 07/2010, which showed severe sleep apnea. Patient currently uses a CPAP machine, but he reports it doesn't last throughout the entire night. In the mornings he is getting message that the machine has major air leakage.    Hypertension, follow-up:  BP Readings from Last 3 Encounters:  03/27/18 138/88  11/08/17 (!) 170/102  12/05/15 (!) 144/82    He was last seen for hypertension 5 months ago.  BP at that visit was 166/100. Management since that visit includes advising patient to restart Aldactone. He reports good compliance with treatment. He is not having side effects.  He is not exercising. He is not adherent to low salt diet.   Outside blood pressures are not being checked. He is experiencing dyspnea.  Patient denies chest pain, chest pressure/discomfort, claudication, exertional chest pressure/discomfort, fatigue, irregular heart beat, lower extremity edema, near-syncope, orthopnea, palpitations, paroxysmal nocturnal dyspnea, syncope and tachypnea.   Cardiovascular risk factors include advanced age (older than 22 for men, 4 for women).  Use of agents associated with hypertension: NSAIDS.     Weight trend: fluctuating a bit Wt Readings from Last 3 Encounters:  03/27/18 233 lb (105.7 kg)  11/08/17 247 lb (112 kg)  12/05/15 255 lb (115.7 kg)    Current diet: well balanced  ------------------------------------------------------------------------ He is also due for follow up CHF, hypertension, and COPD. He has been out of his inhalers for a few years and reports his breath has been significantly worse with coughing and  wheezing since being out of them. No chest pains or heart flutters. No fevers or chills. Had no averse effects from inhalers. Is taking all of his oral medications. He is followed by Dr. Clayborn Bigness annually and last visit was March 2019.     No Known Allergies   Current Outpatient Medications:  .  aspirin 81 MG tablet, Take 81 mg by mouth daily., Disp: , Rfl:  .  furosemide (LASIX) 80 MG tablet, Take 80 mg by mouth 2 (two) times daily. , Disp: , Rfl:  .  losartan (COZAAR) 50 MG tablet, Take 100 mg by mouth daily., Disp: , Rfl:  .  metoprolol succinate (TOPROL-XL) 25 MG 24 hr tablet, Take 2 tablets by mouth daily. , Disp: , Rfl:  .  spironolactone (ALDACTONE) 25 MG tablet, Take 0.5 tablets (12.5 mg total) by mouth daily., Disp: 30 tablet, Rfl: 0 .  SYMBICORT 160-4.5 MCG/ACT inhaler, INHALE TWO PUFFS BY MOUTH TWICE DAILY, Disp: 1 Inhaler, Rfl: 5 .  Tiotropium Bromide Monohydrate (SPIRIVA RESPIMAT) 1.25 MCG/ACT AERS, Inhale 2 puffs into the lungs daily., Disp: 4 g, Rfl: 12 .  venlafaxine XR (EFFEXOR-XR) 150 MG 24 hr capsule, Take 1 capsule (150 mg total) by mouth daily., Disp: 90 capsule, Rfl: 3  Review of Systems  Constitutional: Positive for fatigue. Negative for appetite change, chills and fever.  Respiratory: Negative for chest tightness, shortness of breath and wheezing.   Cardiovascular: Negative for chest pain and palpitations.  Gastrointestinal: Negative for abdominal pain, nausea and vomiting.    Social History   Tobacco Use  . Smoking status:  Current Some Day Smoker    Packs/day: 1.00    Years: 20.00    Pack years: 20.00    Types: Cigars    Last attempt to quit: 09/17/1988    Years since quitting: 29.5  . Smokeless tobacco: Never Used  Substance Use Topics  . Alcohol use: No    Alcohol/week: 0.0 oz   Objective:   BP 138/88 (BP Location: Left Arm, Patient Position: Sitting, Cuff Size: Large)   Pulse 67   Temp 98.5 F (36.9 C) (Oral)   Resp 16   Wt 233 lb (105.7 kg)    SpO2 96% Comment: room air  BMI 33.91 kg/m  Vitals:   03/27/18 1131  BP: 138/88  Pulse: 67  Resp: 16  Temp: 98.5 F (36.9 C)  TempSrc: Oral  SpO2: 96%  Weight: 233 lb (105.7 kg)     Physical Exam  General Appearance:    Alert, cooperative, no distress, obese  Eyes:    PERRL, conjunctiva/corneas clear, EOM's intact       Lungs:     Clear to auscultation bilaterally, diminished breath sounds, respirations unlabored  Heart:    Regular rate and rhythm  Neurologic:   Awake, alert, oriented x 3. No apparent focal neurological           defect.          Assessment & Plan:     1. Obstructive sleep apnea syndrome Current CPAP 63 years old and failing, in need of new equipment and updated CPAP titration study.  - Cpap titration; Future  2. Chronic obstructive pulmonary disease, unspecified COPD type ([place]) Currently off medications which he is to restart now.  - budesonide-formoterol (SYMBICORT) 160-4.5 MCG/ACT inhaler; Inhale 2 puffs into the lungs 2 (two) times daily.  Dispense: 1 Inhaler; Refill: 3 - Tiotropium Bromide Monohydrate (SPIRIVA RESPIMAT) 1.25 MCG/ACT AERS; Inhale 2 puffs into the lungs daily.  Dispense: 4 g; Refill: 3  3. Coronary artery disease involving native coronary artery of native heart without angina pectoris No chest pains. Not on statins. Is on ASA. Check labs and will likely required statin therapy.   4. Congestive heart failure, unspecified HF chronicity, unspecified heart failure type [facility]) Is having DOE which may be pulmonary . Is on antihypertensives. Check labs.  - Comprehensive metabolic panel - Lipid panel - Brain natriuretic peptide  5. Benign essential HTN Much better since getting back on antiypertensives.  - Comprehensive metabolic panel - Lipid panel - Brain natriuretic peptide  6. Dyspnea, unspecified type  - CBC  Addendum:  Patient is using CPAP consistently every now. He is medically benefiting from his CPAP device when it  functions properly. He requires a new device because his CPAP is 8 years and not consistently functioning properly.       Lelon Huh, MD  [facility]

## 2018-03-28 ENCOUNTER — Telehealth: Payer: Self-pay

## 2018-03-28 ENCOUNTER — Encounter: Payer: Self-pay | Admitting: Family Medicine

## 2018-03-28 ENCOUNTER — Other Ambulatory Visit: Payer: Self-pay | Admitting: Family Medicine

## 2018-03-28 DIAGNOSIS — E785 Hyperlipidemia, unspecified: Secondary | ICD-10-CM | POA: Insufficient documentation

## 2018-03-28 LAB — COMPREHENSIVE METABOLIC PANEL
A/G RATIO: 1.4 (ref 1.2–2.2)
ALT: 68 IU/L — AB (ref 0–44)
AST: 67 IU/L — AB (ref 0–40)
Albumin: 4.3 g/dL (ref 3.6–4.8)
Alkaline Phosphatase: 56 IU/L (ref 39–117)
BUN/Creatinine Ratio: 17 (ref 10–24)
BUN: 20 mg/dL (ref 8–27)
Bilirubin Total: 0.7 mg/dL (ref 0.0–1.2)
CHLORIDE: 101 mmol/L (ref 96–106)
CO2: 25 mmol/L (ref 20–29)
Calcium: 9.3 mg/dL (ref 8.6–10.2)
Creatinine, Ser: 1.21 mg/dL (ref 0.76–1.27)
GFR calc Af Amer: 74 mL/min/{1.73_m2} (ref >59)
GFR calc non Af Amer: 64 mL/min/{1.73_m2} (ref >59)
GLOBULIN, TOTAL: 3.1 g/dL (ref 1.5–4.5)
Glucose: 111 mg/dL — ABNORMAL HIGH (ref 65–99)
POTASSIUM: 4.6 mmol/L (ref 3.5–5.2)
SODIUM: 144 mmol/L (ref 134–144)
Total Protein: 7.4 g/dL (ref 6.0–8.5)

## 2018-03-28 LAB — LIPID PANEL
CHOL/HDL RATIO: 8.8 ratio — AB (ref 0.0–5.0)
Cholesterol, Total: 175 mg/dL (ref 100–199)
HDL: 20 mg/dL — AB (ref >39)
LDL Calculated: 111 mg/dL — ABNORMAL HIGH (ref 0–99)
TRIGLYCERIDES: 220 mg/dL — AB (ref 0–149)
VLDL Cholesterol Cal: 44 mg/dL — ABNORMAL HIGH (ref 5–40)

## 2018-03-28 LAB — CBC
HEMATOCRIT: 48.4 % (ref 37.5–51.0)
Hemoglobin: 16.1 g/dL (ref 13.0–17.7)
MCH: 31.4 pg (ref 26.6–33.0)
MCHC: 33.3 g/dL (ref 31.5–35.7)
MCV: 95 fL (ref 79–97)
PLATELETS: 190 10*3/uL (ref 150–450)
RBC: 5.12 x10E6/uL (ref 4.14–5.80)
RDW: 14 % (ref 12.3–15.4)
WBC: 4.2 10*3/uL (ref 3.4–10.8)

## 2018-03-28 LAB — BRAIN NATRIURETIC PEPTIDE: BNP: 92 pg/mL (ref 0.0–100.0)

## 2018-03-28 MED ORDER — ROSUVASTATIN CALCIUM 20 MG PO TABS: 20.0000 mg | ORAL_TABLET | Freq: Every day | ORAL | 3 refills | Status: DC

## 2018-03-28 NOTE — Telephone Encounter (Signed)
-----   Message from Birdie Sons, MD sent at 03/28/2018  1:51 PM EDT ----- LDL cholesterol is 111, and needs to be under 70 to protect his heart. HDL cholesterol is 20 and needs to be above 30. Need to start rosuvastatin 20mg  once a day to prevent anymore heart blockages and heart damage. Prescription has been sent to [facility]  Needs to schedule follow up in 8 weeks. Need to call if he has problems or side effects from rosuvastatin.  Rest of labs are good.

## 2018-03-28 NOTE — Telephone Encounter (Signed)
Pt advised.  Apt made for 05/28/2018 at 8am.   Thanks,   -Mickel Baas

## 2018-04-02 ENCOUNTER — Telehealth: Payer: Self-pay | Admitting: Family Medicine

## 2018-04-02 NOTE — Telephone Encounter (Signed)
Please advise referral?  

## 2018-04-02 NOTE — Telephone Encounter (Signed)
pt's wife called wanting to know if we have set up a referral for her husband to have a sleep study done as discussed at his last visit with Dr. Caryn Section on Wednesday 7/10.    CB# [phone]  Thanks Con Memos

## 2018-04-02 NOTE — Telephone Encounter (Signed)
Was ordered last week, please call sarah to see what the status is.

## 2018-04-03 NOTE — Telephone Encounter (Signed)
Order for [facility] faxed to Dr Caryn Section .If he can sign and return I will take care of it today

## 2018-04-03 NOTE — Telephone Encounter (Signed)
It was ordered, need to call sarah to find out why is hasn't been done yet.

## 2018-04-03 NOTE — Telephone Encounter (Signed)
Order received and placed on Dr. Maralyn Sago desk to be signed.

## 2018-04-03 NOTE — Telephone Encounter (Signed)
Please advise status for sleep study?

## 2018-04-03 NOTE — Telephone Encounter (Signed)
Please advise 

## 2018-04-09 ENCOUNTER — Telehealth: Payer: Self-pay | Admitting: Family Medicine

## 2018-04-09 DIAGNOSIS — Z6833 Body mass index (BMI) 33.0-33.9, adult: Secondary | ICD-10-CM | POA: Diagnosis not present

## 2018-04-09 DIAGNOSIS — G8929 Other chronic pain: Secondary | ICD-10-CM | POA: Diagnosis not present

## 2018-04-09 DIAGNOSIS — R69 Illness, unspecified: Secondary | ICD-10-CM | POA: Diagnosis not present

## 2018-04-09 DIAGNOSIS — I110 Hypertensive heart disease with heart failure: Secondary | ICD-10-CM | POA: Diagnosis not present

## 2018-04-09 DIAGNOSIS — I509 Heart failure, unspecified: Secondary | ICD-10-CM | POA: Diagnosis not present

## 2018-04-09 DIAGNOSIS — E669 Obesity, unspecified: Secondary | ICD-10-CM | POA: Diagnosis not present

## 2018-04-09 DIAGNOSIS — G4730 Sleep apnea, unspecified: Secondary | ICD-10-CM | POA: Diagnosis not present

## 2018-04-09 DIAGNOSIS — Z720 Tobacco use: Secondary | ICD-10-CM | POA: Diagnosis not present

## 2018-04-09 DIAGNOSIS — J449 Chronic obstructive pulmonary disease, unspecified: Secondary | ICD-10-CM | POA: Diagnosis not present

## 2018-04-09 DIAGNOSIS — Z791 Long term (current) use of non-steroidal anti-inflammatories (NSAID): Secondary | ICD-10-CM | POA: Diagnosis not present

## 2018-04-09 NOTE — Telephone Encounter (Signed)
Pt is requesting call back for update on getting sleep study scheduled. Pt request call back. Please advise. Thanks TNP

## 2018-04-10 NOTE — Telephone Encounter (Signed)
Order for CPAP titration faxed to [facility] °

## 2018-04-21 ENCOUNTER — Other Ambulatory Visit: Payer: Self-pay | Admitting: Family Medicine

## 2018-04-21 MED ORDER — UMECLIDINIUM BROMIDE 62.5 MCG/INH IN AEPB: 1.0000 | INHALATION_SPRAY | Freq: Every day | RESPIRATORY_TRACT | 5 refills | Status: DC

## 2018-04-28 ENCOUNTER — Encounter: Payer: Self-pay | Admitting: Family Medicine

## 2018-05-01 ENCOUNTER — Ambulatory Visit: Payer: Medicare HMO | Attending: Otolaryngology

## 2018-05-01 DIAGNOSIS — F5101 Primary insomnia: Secondary | ICD-10-CM | POA: Diagnosis not present

## 2018-05-01 DIAGNOSIS — G4733 Obstructive sleep apnea (adult) (pediatric): Secondary | ICD-10-CM | POA: Insufficient documentation

## 2018-05-01 DIAGNOSIS — R69 Illness, unspecified: Secondary | ICD-10-CM | POA: Diagnosis not present

## 2018-05-15 ENCOUNTER — Telehealth: Payer: Self-pay | Admitting: Family Medicine

## 2018-05-15 NOTE — Telephone Encounter (Signed)
Sleep study show he needs to start Bipap 18/14cm h2o pressure. Have written order, please fax to [facility]. Please advise patient needs office visit 1 month after he starts Bipap

## 2018-05-15 NOTE — Telephone Encounter (Signed)
LMOVM for pt to return call 

## 2018-05-16 NOTE — Telephone Encounter (Signed)
Patient was advised. Expressed understanding.  

## 2018-05-28 ENCOUNTER — Ambulatory Visit (INDEPENDENT_AMBULATORY_CARE_PROVIDER_SITE_OTHER): Payer: Medicare HMO | Admitting: Family Medicine

## 2018-05-28 ENCOUNTER — Encounter: Payer: Self-pay | Admitting: Family Medicine

## 2018-05-28 VITALS — BP 144/82 | HR 69 | Temp 97.9°F | Resp 16 | Wt 237.0 lb

## 2018-05-28 DIAGNOSIS — Z23 Encounter for immunization: Secondary | ICD-10-CM | POA: Diagnosis not present

## 2018-05-28 DIAGNOSIS — E7800 Pure hypercholesterolemia, unspecified: Secondary | ICD-10-CM | POA: Diagnosis not present

## 2018-05-28 DIAGNOSIS — B182 Chronic viral hepatitis C: Secondary | ICD-10-CM | POA: Diagnosis not present

## 2018-05-28 NOTE — Progress Notes (Signed)
Patient: Dylan Lee Male    DOB: 1956/08/23   62 y.o.   MRN: [id number] Visit Date: 05/28/2018  Today's Provider: Lelon Huh, MD   Chief Complaint  Patient presents with  . Hyperlipidemia    follow up   Subjective:    HPI  Lipid/Cholesterol, Follow-up:   Last seen for this 2 months ago.  Management changes since that visit include starting Rosuvastatin 20mg  daily. . Last Lipid Panel:    Component Value Date/Time   CHOL 175 03/27/2018 1200   TRIG 220 (H) 03/27/2018 1200   HDL 20 (L) 03/27/2018 1200   CHOLHDL 8.8 (H) 03/27/2018 1200   LDLCALC 111 (H) 03/27/2018 1200    Risk factors for vascular disease include hypercholesterolemia  He reports poor compliance with treatment. Patient states he never started Rosuvastatin. He didn't pick the medication up from the pharmacy.    Wt Readings from Last 3 Encounters:  05/28/18 237 lb (107.5 kg)  03/27/18 233 lb (105.7 kg)  11/08/17 247 lb (112 kg)    -------------------------------------------------------------------  Also discusses patient's history of hepatitis C. He was treated with interferon many years ago but apparently hep c was not eradicated. He was referred to [facility] by Dr. Tamala Julian in 2012 but apparently did not qualify for triple therapy at that time due to multiple comorbidity. To my knowledge patient has not been re-evaluated since then. He has had mildly elevated transaminases the last few years.     No Known Allergies   Current Outpatient Medications:  .  aspirin 81 MG tablet, Take 81 mg by mouth daily., Disp: , Rfl:  .  budesonide-formoterol (SYMBICORT) 160-4.5 MCG/ACT inhaler, Inhale 2 puffs into the lungs 2 (two) times daily., Disp: 1 Inhaler, Rfl: 3 .  furosemide (LASIX) 80 MG tablet, Take 80 mg by mouth 2 (two) times daily. , Disp: , Rfl:  .  losartan (COZAAR) 50 MG tablet, Take 100 mg by mouth daily., Disp: , Rfl:  .  metoprolol succinate (TOPROL-XL) 25 MG 24 hr tablet, Take 2 tablets by  mouth daily. , Disp: , Rfl:  .  rosuvastatin (CRESTOR) 20 MG tablet, Take 1 tablet (20 mg total) by mouth daily., Disp: 30 tablet, Rfl: 3 .  spironolactone (ALDACTONE) 25 MG tablet, Take 0.5 tablets (12.5 mg total) by mouth daily., Disp: 30 tablet, Rfl: 0 .  umeclidinium bromide (INCRUSE ELLIPTA) 62.5 MCG/INH AEPB, Inhale 1 puff into the lungs daily. Take in the place of Spiriva, Disp: 30 each, Rfl: 5 .  venlafaxine XR (EFFEXOR-XR) 150 MG 24 hr capsule, Take 1 capsule (150 mg total) by mouth daily., Disp: 90 capsule, Rfl: 3  Review of Systems  Constitutional: Negative for appetite change, chills and fever.  Respiratory: Negative for chest tightness, shortness of breath and wheezing.   Cardiovascular: Negative for chest pain and palpitations.  Gastrointestinal: Negative for abdominal pain, nausea and vomiting.    Social History   Tobacco Use  . Smoking status: Current Some Day Smoker    Years: 20.00    Types: Cigars    Last attempt to quit: 09/17/1988    Years since quitting: 29.7  . Smokeless tobacco: Never Used  . Tobacco comment: smokes 1 cigar daily  Substance Use Topics  . Alcohol use: No    Alcohol/week: 0.0 standard drinks   Objective:   BP (!) 144/82 (BP Location: Left Arm, Patient Position: Sitting, Cuff Size: Large)   Pulse 69   Temp 97.9 F (36.6 C) (  Oral)   Resp 16   Wt 237 lb (107.5 kg)   SpO2 97% Comment: room air  BMI 34.50 kg/m  Vitals:   05/28/18 0812  BP: (!) 144/82  Pulse: 69  Resp: 16  Temp: 97.9 F (36.6 C)  TempSrc: Oral  SpO2: 97%  Weight: 237 lb (107.5 kg)     Physical Exam   General Appearance:    Alert, cooperative, no distress  Eyes:    PERRL, conjunctiva/corneas clear, EOM's intact       Lungs:     Clear to auscultation bilaterally, respirations unlabored  Heart:    Regular rate and rhythm  Neurologic:   Awake, alert, oriented x 3. No apparent focal neurological           defect.           Assessment & Plan:     1. Chronic  hepatitis C without hepatic coma (HCC) Will verify presence of hepaitis c and refer GI to review new options for treatment if positive.  - Hepatitis c vrs RNA detect by PCR-qual  2. Need for influenza vaccination  - Flu Vaccine QUAD 6+ mos PF IM (Fluarix Quad PF)  3. Pure hypercholesterolemia Has not yet started statin.        Lelon Huh, MD  [facility]

## 2018-05-31 ENCOUNTER — Other Ambulatory Visit: Payer: Self-pay | Admitting: Family Medicine

## 2018-05-31 DIAGNOSIS — B182 Chronic viral hepatitis C: Secondary | ICD-10-CM

## 2018-05-31 LAB — HEPATITIS C VRS RNA DETECT BY PCR-QUAL: HCV RNA NAA Qualitative: POSITIVE — AB

## 2018-06-02 ENCOUNTER — Telehealth: Payer: Self-pay | Admitting: *Deleted

## 2018-06-02 NOTE — Telephone Encounter (Signed)
LMOVM for pt to return call 

## 2018-06-02 NOTE — Telephone Encounter (Signed)
-----   Message from Birdie Sons, MD sent at 05/31/2018  9:26 PM EDT ----- Patient still has active hepatitis C infection. He needs referral to GI to discuss new treatments for hep c. Have sent order to sarah

## 2018-06-03 NOTE — Telephone Encounter (Signed)
Na, lmtcb 

## 2018-06-03 NOTE — Telephone Encounter (Signed)
lmtcb

## 2018-06-04 DIAGNOSIS — G4733 Obstructive sleep apnea (adult) (pediatric): Secondary | ICD-10-CM | POA: Diagnosis not present

## 2018-06-04 DIAGNOSIS — I158 Other secondary hypertension: Secondary | ICD-10-CM | POA: Diagnosis not present

## 2018-06-04 NOTE — Telephone Encounter (Signed)
lmtcb

## 2018-06-06 ENCOUNTER — Encounter: Payer: Self-pay | Admitting: *Deleted

## 2018-06-06 NOTE — Telephone Encounter (Signed)
Left message to call back. Unable to contact the patient. Letter was mailed advising patient as below. Will save message to chart.

## 2018-06-24 DIAGNOSIS — I5022 Chronic systolic (congestive) heart failure: Secondary | ICD-10-CM | POA: Diagnosis not present

## 2018-07-04 DIAGNOSIS — G4733 Obstructive sleep apnea (adult) (pediatric): Secondary | ICD-10-CM | POA: Diagnosis not present

## 2018-07-04 DIAGNOSIS — I158 Other secondary hypertension: Secondary | ICD-10-CM | POA: Diagnosis not present

## 2018-07-11 ENCOUNTER — Telehealth: Payer: Self-pay | Admitting: Family Medicine

## 2018-07-11 NOTE — Telephone Encounter (Signed)
I called the patient to schedule AWV, but there was no answer and no option to leave a message. VDM (DD) °

## 2018-07-21 ENCOUNTER — Ambulatory Visit: Payer: Medicare HMO | Admitting: Gastroenterology

## 2018-07-21 ENCOUNTER — Other Ambulatory Visit
Admission: RE | Admit: 2018-07-21 | Discharge: 2018-07-21 | Disposition: A | Discharge status: Home / Self Care | Payer: Medicare HMO | Source: Ambulatory Visit | Attending: Gastroenterology | Admitting: Gastroenterology

## 2018-07-21 ENCOUNTER — Encounter

## 2018-07-21 ENCOUNTER — Encounter: Payer: Self-pay | Admitting: Gastroenterology

## 2018-07-21 VITALS — BP 139/83 | HR 73 | Ht 70.0 in | Wt 239.0 lb

## 2018-07-21 DIAGNOSIS — B182 Chronic viral hepatitis C: Secondary | ICD-10-CM

## 2018-07-21 LAB — HEPATIC FUNCTION PANEL
ALBUMIN: 4.2 g/dL (ref 3.5–5.0)
ALT: 74 U/L — AB (ref 0–44)
AST: 70 U/L — AB (ref 15–41)
Alkaline Phosphatase: 52 U/L (ref 38–126)
BILIRUBIN INDIRECT: 0.6 mg/dL (ref 0.3–0.9)
Bilirubin, Direct: 0.3 mg/dL — ABNORMAL HIGH (ref 0.0–0.2)
TOTAL PROTEIN: 8 g/dL (ref 6.5–8.1)
Total Bilirubin: 0.9 mg/dL (ref 0.3–1.2)

## 2018-07-21 NOTE — Progress Notes (Signed)
Gastroenterology Consultation  Referring Provider:     Birdie Sons, MD Primary Care Physician:  Birdie Sons, MD Primary Gastroenterologist:  Dr. Allen Norris     Reason for Consultation:     Hepatitis C        HPI:   Dylan Lee is a 62 y.o. y/o male referred for consultation & management of hepatitis C by Dr. Caryn Section, Kirstie Peri, MD.  This patient comes in today after having a history of hepatitis C.  The patient was treated with interferon many years ago and reports that that was done at [facility].  The patient did not clear the virus at that time and has had abnormal liver enzymes in the past.  The patient was recently found to have the hepatitis C virus load positive without any recent evaluation by GI for further treatment. He reports that he did use IV drugs approximately 20 years ago and has not sought any further treatment for his hepatitis C because he states that he has been feeling well and was never made aware of any problems with his liver.  The patient does have abnormal liver enzymes that were recorded over the last few years.  He also reports that he is concerned because he has congestive heart failure and the interferon gave him a lot of side effects.  Patient denies any alcohol abuse.  He states he has cut down in his smoking to only 1 cigarette a day.  Past Medical History:  Diagnosis Date  . CHF (congestive heart failure) ([place])   . Hepatitis C    Hep C treated   . History of chicken pox   . History of measles   . History of mumps   . Kidney stones   . Shingles 09/06/2015    Past Surgical History:  Procedure Laterality Date  . BUNIONECTOMY    . CARDIAC CATHETERIZATION N/A 03/02/2015   Procedure: Right and Left Heart Cath;  Surgeon: Yolonda Kida, MD;  Location: [facility];  Service: Cardiovascular;  Laterality: N/A;  . DENTAL SURGERY    . FOOT SURGERY Right   . IMPLANTABLE CARDIOVERTER DEFIBRILLATOR (ICD) GENERATOR CHANGE Right 06/03/2015   Procedure:  ICD generator insertion ;  Surgeon: Marzetta Board, MD;  Location: [facility] ORS;  Service: Cardiovascular;  Laterality: Right;  . LITHOTRIPSY    . sleep study  07/2010   severe OSA, snoring. Bilevel pressure at 19/14. Jiengel. ENT consult/ Jiengel: no ENT surgical indication for OSA  . SPIROMETRY  10/20/2014   Severe obstruction    Prior to Admission medications   Medication Sig Start Date End Date Taking? Authorizing Provider  aspirin 81 MG tablet Take 81 mg by mouth daily.    [provider]  furosemide (LASIX) 80 MG tablet Take 80 mg by mouth 2 (two) times daily.     [provider]  losartan (COZAAR) 50 MG tablet Take 100 mg by mouth daily.    [provider]  metoprolol succinate (TOPROL-XL) 25 MG 24 hr tablet Take 2 tablets by mouth daily.  10/20/14   [provider]  rosuvastatin (CRESTOR) 20 MG tablet Take 1 tablet (20 mg total) by mouth daily. Patient not taking: Reported on 05/28/2018 03/28/18   Birdie Sons, MD  spironolactone (ALDACTONE) 25 MG tablet Take 0.5 tablets (12.5 mg total) by mouth daily. 12/13/17   Birdie Sons, MD  umeclidinium bromide (INCRUSE ELLIPTA) 62.5 MCG/INH AEPB Inhale 1 puff into the lungs  daily. Take in the place of Spiriva 04/21/18   Birdie Sons, MD  venlafaxine XR (EFFEXOR-XR) 150 MG 24 hr capsule Take 1 capsule (150 mg total) by mouth daily. 12/13/17   Birdie Sons, MD    Family History  Problem Relation Age of Onset  . Hypertension Mother   . Bipolar disorder Mother   . Ovarian cancer Mother   . Diabetes Father   . Alcohol abuse Father   . CAD Father   . Depression Father   . Alcohol abuse Sister   . Congestive Heart Failure Brother   . Alcohol abuse Brother      Social History   Tobacco Use  . Smoking status: Current Some Day Smoker    Years: 20.00    Types: Cigars    Last attempt to quit: 09/17/1988    Years since quitting: 29.8  . Smokeless tobacco: Never Used  . Tobacco comment: smokes 1  cigar daily  Substance Use Topics  . Alcohol use: No    Alcohol/week: 0.0 standard drinks  . Drug use: No    Allergies as of 07/21/2018  . (No Known Allergies)    Review of Systems:    All systems reviewed and negative except where noted in HPI.   Physical Exam:  There were no vitals taken for this visit. No LMP for male patient. General:   Alert,  Well-developed, well-nourished, pleasant and cooperative in NAD Head:  Normocephalic and atraumatic. Eyes:  Sclera clear, no icterus.   Conjunctiva pink. Ears:  Normal auditory acuity. Nose:  No deformity, discharge, or lesions. Mouth:  No deformity or lesions,oropharynx pink & moist. Neck:  Supple; no masses or thyromegaly. Lungs:  Respirations even and unlabored.  Decreased breath sounds bilaterally. No acute distress. Heart:  Regular rate and rhythm; no murmurs, clicks, rubs, or gallops. Abdomen:  Normal bowel sounds.  No bruits.  Soft, non-tender and non-distended without masses, hepatosplenomegaly or hernias noted.  No guarding or rebound tenderness.  Negative Carnett sign.   Rectal:  Deferred.  Msk:  Symmetrical without gross deformities.  Good, equal movement & strength bilaterally. Pulses:  Normal pulses noted. Extremities:  No clubbing or edema.  No cyanosis.  Positive palmar erythema with contractures on the hands Neurologic:  Alert and oriented x3;  grossly normal neurologically. Skin:  Intact without significant lesions or rashes.  No jaundice. Lymph Nodes:  No significant cervical adenopathy. Psych:  Alert and cooperative. Normal mood and affect.  Imaging Studies: No results found.  Assessment and Plan:   Dylan Lee is a 62 y.o. y/o male who has a history of hepatitis C for 20 years most likely.  The patient reports that he used IV drugs back then.  The patient denies any blood transfusion before 1990 or any homemade tattoos.  He also denies ever using cocaine nasally.  The patient will have labs sent off for  genotype and fibrosis score.  He will also be set up for a right upper quadrant ultrasound.  When the labs come back the patient will be treated for his hepatitis C accordingly.  The patient has been explained that the risks of the medication are less than when he took the interferon with much less side effects.  The patient has been explained the plan and agrees with it.  Lucilla Lame, MD. [facility]    Note: This dictation was prepared with Dragon dictation along with smaller phrase technology. Any transcriptional errors that result from this process are unintentional.

## 2018-07-21 NOTE — Patient Instructions (Signed)
You are scheduled for an RUQ US/elastography at [facility] on Monday, Nov 11th at 8:30am. Please arrive at the medical mall registration desk at 8:15am. You cannot have anything to eat or drink after midnight on Sunday night.  If you need to reschedule this appointment for any reason, please contact central scheduling at [phone].

## 2018-07-22 LAB — ALPHA-1-ANTITRYPSIN: A1 ANTITRYPSIN SER: 149 mg/dL (ref 101–187)

## 2018-07-22 LAB — HEPATITIS B SURFACE ANTIGEN: Hepatitis B Surface Ag: NEGATIVE

## 2018-07-22 LAB — HCV RNA QUANT
HCV Quantitative Log: 6.792 log10 IU/mL (ref >1.70)
HCV Quantitative: 6190000 IU/mL (ref >50)

## 2018-07-22 LAB — MITOCHONDRIAL ANTIBODIES

## 2018-07-22 LAB — CERULOPLASMIN: CERULOPLASMIN: 30.1 mg/dL (ref 16.0–31.0)

## 2018-07-22 LAB — HEPATITIS B SURFACE ANTIBODY,QUALITATIVE: HEP B S AB: NONREACTIVE

## 2018-07-22 LAB — ANTI-SMOOTH MUSCLE ANTIBODY, IGG: F-Actin IgG: 12 Units (ref 0–19)

## 2018-07-22 LAB — ANA: Anti Nuclear Antibody(ANA): POSITIVE — AB

## 2018-07-22 LAB — HEPATITIS A ANTIBODY, TOTAL: Hep A Total Ab: POSITIVE — AB

## 2018-07-24 LAB — HEPATITIS C GENOTYPE

## 2018-07-28 ENCOUNTER — Ambulatory Visit
Admission: RE | Admit: 2018-07-28 | Discharge: 2018-07-28 | Disposition: A | Discharge status: Home / Self Care | Payer: Medicare HMO | Source: Ambulatory Visit | Attending: Gastroenterology | Admitting: Gastroenterology

## 2018-07-28 DIAGNOSIS — B1710 Acute hepatitis C without hepatic coma: Secondary | ICD-10-CM | POA: Diagnosis not present

## 2018-07-28 DIAGNOSIS — B182 Chronic viral hepatitis C: Secondary | ICD-10-CM | POA: Diagnosis not present

## 2018-07-28 DIAGNOSIS — K8020 Calculus of gallbladder without cholecystitis without obstruction: Secondary | ICD-10-CM | POA: Insufficient documentation

## 2018-07-30 ENCOUNTER — Telehealth: Payer: Self-pay

## 2018-07-30 NOTE — Telephone Encounter (Signed)
-----   Message from Lucilla Lame, MD sent at 07/28/2018  1:02 PM EST ----- Let the patient know his fibrosis was F0-F1. Needs to start treatment.

## 2018-07-30 NOTE — Telephone Encounter (Signed)
Left vm for pt to return my call regarding Hep C treatment.

## 2018-07-31 MED ORDER — GLECAPREVIR-PIBRENTASVIR 100-40 MG PO TABS: 3.0000 | ORAL_TABLET | Freq: Every day | ORAL | 2 refills | Status: DC

## 2018-07-31 NOTE — Telephone Encounter (Signed)
Called pt today and informed him of his lab and [place] results. Pt has been approved for Hep C treatment. He will be contacted by [facility] to schedule a delivery.

## 2018-08-04 DIAGNOSIS — G4733 Obstructive sleep apnea (adult) (pediatric): Secondary | ICD-10-CM | POA: Diagnosis not present

## 2018-08-04 DIAGNOSIS — I158 Other secondary hypertension: Secondary | ICD-10-CM | POA: Diagnosis not present

## 2018-08-13 ENCOUNTER — Telehealth: Payer: Self-pay | Admitting: Gastroenterology

## 2018-08-13 ENCOUNTER — Other Ambulatory Visit: Payer: Self-pay | Admitting: Family Medicine

## 2018-08-13 DIAGNOSIS — E7800 Pure hypercholesterolemia, unspecified: Secondary | ICD-10-CM

## 2018-08-13 NOTE — Telephone Encounter (Signed)
Message sent to Dr. Caryn Section to okay for pt to stop his crestor while taking the Mavyret (Hep C treatment)

## 2018-08-13 NOTE — Telephone Encounter (Signed)
Monica from [place] left vm regrading interaction with rx  [place] and rx Crestore please call [phone] ext [phone]

## 2018-08-13 NOTE — Telephone Encounter (Signed)
-----   Message from [place], [place] sent at 08/13/2018 11:45 AM EST ----- Regarding: medication interaction Hey Dr. Caryn Section,  This pt will be starting his Hep C treatment next week. We have been advised by the specialty pharmacy that there is a drug interaction between [place] and his Crestor. Please ok for him to stop the Crestor while on treatment.   Thank you!  Ginger Feldpausch, CMA

## 2018-08-13 NOTE — Telephone Encounter (Signed)
Please advise patient he needs to stop Rosuvastatin while he is on Mavyret that is being prescribed by Dr. Allen Norris. Will temporarily change to ezetimibe 10mg  once a day, #30, rf x 2. Please advise patient and send prescription to pharmacy of his choice.

## 2018-08-15 MED ORDER — EZETIMIBE 10 MG PO TABS: 10.0000 mg | ORAL_TABLET | Freq: Every day | ORAL | 2 refills | Status: DC

## 2018-08-15 NOTE — Telephone Encounter (Signed)
Patient advised and agrees with plan. Prescription sent into pharmacy. Patient states this is the first that he is hearing about Dr. Allen Norris treating him with that medication. Patient states he has been waiting weeks for their pharmacy to call him about treatment. I advised patient to contact Dr. Dorothey Baseman office for an update on plan of care. Patient verbally voiced understanding.

## 2018-08-25 ENCOUNTER — Telehealth: Payer: Self-pay | Admitting: Family Medicine

## 2018-08-25 NOTE — Telephone Encounter (Signed)
I spoke with the patient earlier today to schedule AWV with McKenzie.  I later realized that he is actually due for his Welcome to Medicare visit w/ Dr. Caryn Section before July 2020. So I called him back and left a message explaining that I was cancelling the appointment with McKenzie.  I asked him to call me back at [phone] to schedule Welcome to Medicare visit with Dr. Caryn Section. VDM (DD)

## 2018-09-03 DIAGNOSIS — G4733 Obstructive sleep apnea (adult) (pediatric): Secondary | ICD-10-CM | POA: Diagnosis not present

## 2018-09-03 DIAGNOSIS — I158 Other secondary hypertension: Secondary | ICD-10-CM | POA: Diagnosis not present

## 2018-09-04 NOTE — Telephone Encounter (Signed)
Patient had called office stating that he was returning your call he request that you give him a call back. KW

## 2018-09-05 ENCOUNTER — Ambulatory Visit: Payer: Medicare HMO

## 2018-09-23 DIAGNOSIS — I5022 Chronic systolic (congestive) heart failure: Secondary | ICD-10-CM | POA: Diagnosis not present

## 2018-09-29 ENCOUNTER — Encounter: Payer: Self-pay | Admitting: Family Medicine

## 2018-09-29 ENCOUNTER — Ambulatory Visit (INDEPENDENT_AMBULATORY_CARE_PROVIDER_SITE_OTHER): Payer: Medicare HMO | Admitting: Family Medicine

## 2018-09-29 VITALS — BP 120/80 | HR 78 | Temp 99.2°F | Resp 16 | Ht 70.0 in | Wt 241.0 lb

## 2018-09-29 DIAGNOSIS — J449 Chronic obstructive pulmonary disease, unspecified: Secondary | ICD-10-CM | POA: Diagnosis not present

## 2018-09-29 DIAGNOSIS — I2510 Atherosclerotic heart disease of native coronary artery without angina pectoris: Secondary | ICD-10-CM

## 2018-09-29 DIAGNOSIS — Z720 Tobacco use: Secondary | ICD-10-CM | POA: Diagnosis not present

## 2018-09-29 DIAGNOSIS — Z9989 Dependence on other enabling machines and devices: Secondary | ICD-10-CM

## 2018-09-29 DIAGNOSIS — B182 Chronic viral hepatitis C: Secondary | ICD-10-CM

## 2018-09-29 DIAGNOSIS — I420 Dilated cardiomyopathy: Secondary | ICD-10-CM

## 2018-09-29 DIAGNOSIS — Z125 Encounter for screening for malignant neoplasm of prostate: Secondary | ICD-10-CM

## 2018-09-29 DIAGNOSIS — Z0000 Encounter for general adult medical examination without abnormal findings: Secondary | ICD-10-CM

## 2018-09-29 DIAGNOSIS — I10 Essential (primary) hypertension: Secondary | ICD-10-CM

## 2018-09-29 DIAGNOSIS — E785 Hyperlipidemia, unspecified: Secondary | ICD-10-CM

## 2018-09-29 DIAGNOSIS — G4733 Obstructive sleep apnea (adult) (pediatric): Secondary | ICD-10-CM | POA: Diagnosis not present

## 2018-09-29 MED ORDER — BUDESONIDE-FORMOTEROL FUMARATE 160-4.5 MCG/ACT IN AERO: 2.0000 | INHALATION_SPRAY | Freq: Two times a day (BID) | RESPIRATORY_TRACT | 3 refills | Status: DC

## 2018-09-29 NOTE — Patient Instructions (Signed)
.   Please bring all of your medications to every appointment so we can make sure that our medication list is the same as yours.   

## 2018-09-29 NOTE — Progress Notes (Signed)
Patient: Dylan Lee, Male    DOB: Jun 16, 1956, 63 y.o.   MRN: [id number] Visit Date: 09/29/2018  Today's Provider: Lelon Huh, MD   Chief Complaint  Patient presents with  . Other    IPPE  . Hyperlipidemia  . COPD  . Coronary Artery Disease  . Hypertension   Subjective:   Initial preventative physical exam Dylan Lee is a 63 y.o. male who presents today for his Initial Preventative Physical Exam. He feels fairly well. He reports no regular exercising. He reports he is sleeping fairly well.   Lipid/Cholesterol, Follow-up:   Last seen for this 4 months ago.  Management changes since that visit include none. . Last Lipid Panel:    Component Value Date/Time   CHOL 175 03/27/2018 1200   TRIG 220 (H) 03/27/2018 1200   HDL 20 (L) 03/27/2018 1200   CHOLHDL 8.8 (H) 03/27/2018 1200   LDLCALC 111 (H) 03/27/2018 1200    Risk factors for vascular disease include hypercholesterolemia and hypertension  He reports good compliance with treatment. He is not having side effects.  Current symptoms include none and have been stable. Weight trend: fluctuating a bit Prior visit with dietician: no Current diet: well balanced Current exercise: none  Wt Readings from Last 3 Encounters:  09/29/18 241 lb (109.3 kg)  07/21/18 239 lb (108.4 kg)  05/28/18 237 lb (107.5 kg)    -------------------------------------------------------------------  Hypertension, follow-up:  BP Readings from Last 3 Encounters:  09/29/18 120/80  07/21/18 139/83  05/28/18 (!) 144/82    He was last seen for hypertension 6 months ago.  BP at that visit was 138/88. Management since that visit includes no changes. He reports good compliance with treatment. He is not having side effects.  He is not exercising. He is not adherent to low salt diet.   Outside blood pressures are not being checked. He is experiencing dyspnea.  Patient denies chest pain, chest pressure/discomfort,  claudication, exertional chest pressure/discomfort, irregular heart beat, lower extremity edema, near-syncope, orthopnea, palpitations, paroxysmal nocturnal dyspnea, syncope and tachypnea.   Cardiovascular risk factors include advanced age (older than 39 for men, 70 for women), dyslipidemia, hypertension and male gender.  Use of agents associated with hypertension: NSAIDS.     Weight trend: fluctuating a bit Wt Readings from Last 3 Encounters:  09/29/18 241 lb (109.3 kg)  07/21/18 239 lb (108.4 kg)  05/28/18 237 lb (107.5 kg)    Current diet: well balanced  ------------------------------------------------------------------------   Follow up for Chronic obstructive pulmonary disease  The patient was last seen for this 6 months ago. Changes made at last visit include none; patient was advised to restart medications.  He reports good compliance with treatment. He feels that condition is stable. He is not having side effects.  States he is using Incruse inhaler consistently. Was using Symbicort a few years ago which he felt worked better, but was stopped when taken off of formulary.   ------------------------------------------------------------------------------------   Follow up for CAD:  The patient was last seen for this 6 months ago. Changes made at last visit include starting a statin.  He reports good compliance with treatment. He feels that condition is Unchanged. He is not having side effects.   ------------------------------------------------------------------------------------    Review of Systems  Constitutional: Positive for fatigue. Negative for appetite change, chills and fever.  HENT: Negative for congestion, ear pain, hearing loss, nosebleeds and trouble swallowing.   Eyes: Negative for pain and visual disturbance.  Respiratory: Positive for shortness of breath (with exertion). Negative for cough and chest tightness.   Cardiovascular: Negative for chest  pain, palpitations and leg swelling.  Gastrointestinal: Negative for abdominal pain, blood in stool, constipation, diarrhea, nausea and vomiting.  Endocrine: Negative for polydipsia, polyphagia and polyuria.  Genitourinary: Negative for dysuria and flank pain.  Musculoskeletal: Negative for arthralgias, back pain, joint swelling, myalgias and neck stiffness.  Skin: Negative for color change, rash and wound.  Neurological: Positive for headaches. Negative for dizziness, tremors, seizures, speech difficulty, weakness and light-headedness.  Psychiatric/Behavioral: Negative for behavioral problems, confusion, decreased concentration, dysphoric mood and sleep disturbance. The patient is not nervous/anxious.   All other systems reviewed and are negative.   Social History   Socioeconomic History  . Marital status: Married    Spouse name: Not on file  . Number of children: Not on file  . Years of education: Not on file  . Highest education level: Not on file  Occupational History  . Occupation: Arboriculturist  . Financial resource strain: Not on file  . Food insecurity:    Worry: Not on file    Inability: Not on file  . Transportation needs:    Medical: Not on file    Non-medical: Not on file  Tobacco Use  . Smoking status: Current Some Day Smoker    Years: 20.00    Types: Cigars    Last attempt to quit: 09/17/1988    Years since quitting: 30.0  . Smokeless tobacco: Never Used  . Tobacco comment: smokes 1 cigar daily  Substance and Sexual Activity  . Alcohol use: No    Alcohol/week: 0.0 standard drinks  . Drug use: No  . Sexual activity: Not on file  Lifestyle  . Physical activity:    Days per week: Not on file    Minutes per session: Not on file  . Stress: Not on file  Relationships  . Social connections:    Talks on phone: Not on file    Gets together: Not on file    Attends religious service: Not on file    Active member of club or organization: Not on file     Attends meetings of clubs or organizations: Not on file    Relationship status: Not on file  . Intimate partner violence:    Fear of current or ex partner: Not on file    Emotionally abused: Not on file    Physically abused: Not on file    Forced sexual activity: Not on file  Other Topics Concern  . Not on file  Social History Narrative  . Not on file    Past Medical History:  Diagnosis Date  . CHF (congestive heart failure) ([place])   . Hepatitis C    Hep C treated   . History of chicken pox   . History of measles   . History of mumps   . Kidney stones   . Shingles 09/06/2015     Patient Active Problem List   Diagnosis Date Noted  . Dyslipidemia (high LDL; low HDL) 03/28/2018  . CAD (coronary artery disease) 03/27/2018  . Cardiac defibrillator in situ 12/06/2015  . Urinary incontinence 12/05/2015  . Compulsive tobacco user syndrome 09/06/2015  . Allergic rhinitis 09/06/2015  . Kidney stones 09/06/2015  . Erectile dysfunction 09/06/2015  . DOE (dyspnea on exertion) 09/06/2015  . COPD (chronic obstructive pulmonary disease) ([place]) 09/06/2015  . Bradycardia 06/03/2015  . Chronic systolic heart failure ([place]) 03/02/2015  .  Congestive dilated cardiomyopathy ([place]) 03/02/2015  . Pre-diabetes 01/14/2010  . Tinnitus 06/24/2009  . Psoriasis 04/15/2007  . External hemorrhoids without complication 85/27/7824  . OSA on CPAP 04/07/2007  . Hepatitis C virus infection without hepatic coma 04/07/2007  . Benign neoplasm of large bowel 04/07/2007  . Rheumatoid arthritis ([place]) 03/24/2007  . Benign essential HTN 03/24/2007  . Esophageal reflux 03/24/2007  . Depressive disorder 03/24/2007  . Asthma 03/24/2007  . Current tobacco use 03/24/2007    Past Surgical History:  Procedure Laterality Date  . BUNIONECTOMY    . CARDIAC CATHETERIZATION N/A 03/02/2015   Procedure: Right and Left Heart Cath;  Surgeon: Yolonda Kida, MD;  Location: [facility];  Service: Cardiovascular;   Laterality: N/A;  . DENTAL SURGERY    . FOOT SURGERY Right   . IMPLANTABLE CARDIOVERTER DEFIBRILLATOR (ICD) GENERATOR CHANGE Right 06/03/2015   Procedure: ICD generator insertion ;  Surgeon: Marzetta Board, MD;  Location: [facility];  Service: Cardiovascular;  Laterality: Right;  . LITHOTRIPSY    . sleep study  07/2010   severe OSA, snoring. Bilevel pressure at 19/14. Jiengel. ENT consult/ Jiengel: no ENT surgical indication for OSA  . SPIROMETRY  10/20/2014   Severe obstruction    His family history includes Alcohol abuse in his brother, father, and sister; Bipolar disorder in his mother; CAD in his father; Congestive Heart Failure in his brother; Depression in his father; Diabetes in his father; Heart disease in his mother; Hypertension in his mother; Liver cancer in his brother; Ovarian cancer in his mother.      Current Outpatient Medications:  .  aspirin 81 MG tablet, Take 81 mg by mouth daily., Disp: , Rfl:  .  ezetimibe (ZETIA) 10 MG tablet, Take 1 tablet (10 mg total) by mouth daily., Disp: 30 tablet, Rfl: 2 .  furosemide (LASIX) 80 MG tablet, Take 80 mg by mouth 2 (two) times daily. , Disp: , Rfl:  .  Glecaprevir-Pibrentasvir (MAVYRET) 100-40 MG TABS, Take 3 tablets by mouth daily., Disp: 84 tablet, Rfl: 2 .  losartan (COZAAR) 50 MG tablet, Take 100 mg by mouth daily., Disp: , Rfl:  .  metoprolol succinate (TOPROL-XL) 25 MG 24 hr tablet, Take 2 tablets by mouth daily. , Disp: , Rfl:  .  spironolactone (ALDACTONE) 25 MG tablet, Take 0.5 tablets (12.5 mg total) by mouth daily., Disp: 30 tablet, Rfl: 0 .  umeclidinium bromide (INCRUSE ELLIPTA) 62.5 MCG/INH AEPB, Inhale 1 puff into the lungs daily. Take in the place of Spiriva, Disp: 30 each, Rfl: 5 .  venlafaxine XR (EFFEXOR-XR) 150 MG 24 hr capsule, Take 1 capsule (150 mg total) by mouth daily., Disp: 90 capsule, Rfl: 3   Patient Care Team: Birdie Sons, MD as PCP - General (Family Medicine) Yolonda Kida, MD as Consulting  Physician (Cardiology) Manya Silvas, MD (Gastroenterology)      Objective:   Vitals: BP 120/80 (BP Location: Left Arm, Patient Position: Sitting, Cuff Size: Large)   Pulse 78   Temp 99.2 F (37.3 C) (Oral)   Resp 16   Ht 5\' 10"  (1.778 m)   Wt 241 lb (109.3 kg)   SpO2 94% Comment: room air  BMI 34.58 kg/m   Physical Exam    General Appearance:    Alert, cooperative, no distress, appears stated age  Head:    Normocephalic, without obvious abnormality, atraumatic  Eyes:    PERRL, conjunctiva/corneas clear, EOM's intact, fundi    benign, both  eyes       Ears:    Normal TM's and external ear canals, both ears  Nose:   Nares normal, septum midline, mucosa normal, no drainage   or sinus tenderness  Throat:   Lips, mucosa, and tongue normal; teeth and gums normal  Neck:   Supple, symmetrical, trachea midline, no adenopathy;       thyroid:  No enlargement/tenderness/nodules; no carotid   bruit or JVD  Back:     Symmetric, no curvature, ROM normal, no CVA tenderness  Lungs:     Clear to auscultation bilaterally, respirations unlabored. Diminished breath sounds.   Chest wall:    No tenderness or deformity  Heart:    Regular rate and rhythm, S1 and S2 normal, no murmur, rub   or gallop  Abdomen:     Soft, non-tender, bowel sounds active all four quadrants,    no masses, no organomegaly  Genitalia:    deferred  Rectal:    deferred  Extremities:   Extremities normal, atraumatic, no cyanosis or edema  Pulses:   2+ and symmetric all extremities  Skin:   Skin color, texture, turgor normal, no rashes or lesions  Lymph nodes:   Cervical, supraclavicular, and axillary nodes normal  Neurologic:   CNII-XII intact. Normal strength, sensation and reflexes      throughout   Spirometry: Severe Obstruction   Visual Acuity Screening   Right eye Left eye Both eyes  Without correction: 20/100 20/200 20/100  With correction: 20/13 20/20 20/13   Comments: Patient saw all  colors   Activities of Daily Living In your present state of health, do you have any difficulty performing the following activities: 09/29/2018  Hearing? N  Vision? N  Difficulty concentrating or making decisions? N  Walking or climbing stairs? Y  Dressing or bathing? N  Doing errands, shopping? N  Some recent data might be hidden    Fall Risk Assessment Fall Risk  09/29/2018  Falls in the past year? 0  Number falls in past yr: 0  Injury with Fall? 0     Depression Screen PHQ 2/9 Scores 09/29/2018  PHQ - 2 Score 4  PHQ- 9 Score 11    No flowsheet data found.    Assessment & Plan:     Initial Preventative Physical Exam  Reviewed patient's Family Medical History Reviewed and updated list of patient's medical providers Assessment of cognitive impairment was done Assessed patient's functional ability Established a written schedule for health screening [facility] Completed and Reviewed  Exercise Activities and Dietary recommendations Goals   None     Immunization History  Administered Date(s) Administered  . Influenza Split 06/24/2009, 06/22/2010  . Influenza,inj,Quad PF,6+ Mos 05/28/2018  . Influenza-Unspecified 07/19/2017  . Pneumococcal Polysaccharide-23 06/24/2009    Health Maintenance  Topic Date Due  . HIV Screening  05/11/1970  . TETANUS/TDAP  05/11/1974  . COLONOSCOPY  05/11/2005  . INFLUENZA VACCINE  Completed  . Hepatitis C Screening  Completed     Discussed health benefits of physical activity, and encouraged him to engage in regular exercise appropriate for his age and condition.    ------------------------------------------------------------------------------------------------------------  1. Encounter for initial preventive physical examination covered by Medicare   2. Current tobacco use Strongly encouraged to stop smoking completely. Reviewed his smoking history and had not clearly exceeded 30 pack years of  cigarettes, is his smoking has been intermittent with variable amounts of cigarettes and cigars over the years.   3. Dyslipidemia (high  LDL; low HDL) Currently ezetimibe due to interaction of statins with Mavyret. He is to go back on rosuvastatin which he had tolerated well once he finished Hep C treatment. Will check lipids in 3 months.   4. Chronic hepatitis C without hepatic coma (HCC) Stable on Mavyret - HIV Antibody (routine testing w rflx); Future  5. OSA on CPAP He reports he is using CPAP consistently and feels much better rested and energetic since using it.   6. Chronic obstructive pulmonary disease, unspecified COPD type ([place]) Still with DOE and severe obstruction on - Spirometry with Graph Will add Symbicort to Incruse. Follow up in 3 months.   7. Coronary artery disease involving native coronary artery of native heart without angina pectoris Asymptomatic. Compliant with medication.  Continue aggressive risk factor modification.  Continue routine follow up with Dr. Clayborn Bigness.  - Comprehensive metabolic panel; Future - Lipid panel; Future - CBC; Future  8. Benign essential HTN Well controlled.  Continue current medications.    9. Congestive dilated cardiomyopathy ([place]) Having some DOE, but much is likely due to severe COPD. Stable on current meds. Follow up cardiology as scheduled.   10. Prostate cancer screening  - PSA; Future   Lelon Huh, MD  [facility]

## 2018-10-01 ENCOUNTER — Telehealth: Payer: Self-pay | Admitting: Gastroenterology

## 2018-10-01 NOTE — Telephone Encounter (Signed)
Merrilee Seashore from  [facility] left vm regarding pt He wants to confirm that pt needs to continue [facility] he sees in the notes pt does not have cirrhosis but Dr. Allen Norris order 4 weeks of treatment and wants to confirm treatment please call [phone]  ext [phone]

## 2018-10-02 NOTE — Telephone Encounter (Signed)
Left vm for Dylan Lee to inform him to go ahead and allow 8 weeks of treatment. Pt does not have cirrhosis and 8 weeks is the standard treatment time. Requested a call back to confirm the order.

## 2018-10-04 DIAGNOSIS — I158 Other secondary hypertension: Secondary | ICD-10-CM | POA: Diagnosis not present

## 2018-10-04 DIAGNOSIS — G4733 Obstructive sleep apnea (adult) (pediatric): Secondary | ICD-10-CM | POA: Diagnosis not present

## 2018-11-04 DIAGNOSIS — I158 Other secondary hypertension: Secondary | ICD-10-CM | POA: Diagnosis not present

## 2018-11-04 DIAGNOSIS — G4733 Obstructive sleep apnea (adult) (pediatric): Secondary | ICD-10-CM | POA: Diagnosis not present

## 2018-12-03 DIAGNOSIS — G4733 Obstructive sleep apnea (adult) (pediatric): Secondary | ICD-10-CM | POA: Diagnosis not present

## 2018-12-03 DIAGNOSIS — I158 Other secondary hypertension: Secondary | ICD-10-CM | POA: Diagnosis not present

## 2018-12-10 DIAGNOSIS — R69 Illness, unspecified: Secondary | ICD-10-CM | POA: Diagnosis not present

## 2018-12-10 DIAGNOSIS — I10 Essential (primary) hypertension: Secondary | ICD-10-CM | POA: Diagnosis not present

## 2018-12-10 DIAGNOSIS — G4730 Sleep apnea, unspecified: Secondary | ICD-10-CM | POA: Diagnosis not present

## 2018-12-10 DIAGNOSIS — I480 Paroxysmal atrial fibrillation: Secondary | ICD-10-CM | POA: Diagnosis not present

## 2018-12-10 DIAGNOSIS — I5022 Chronic systolic (congestive) heart failure: Secondary | ICD-10-CM | POA: Diagnosis not present

## 2018-12-10 DIAGNOSIS — E6609 Other obesity due to excess calories: Secondary | ICD-10-CM | POA: Diagnosis not present

## 2018-12-10 DIAGNOSIS — R600 Localized edema: Secondary | ICD-10-CM | POA: Diagnosis not present

## 2018-12-10 DIAGNOSIS — Z95810 Presence of automatic (implantable) cardiac defibrillator: Secondary | ICD-10-CM | POA: Diagnosis not present

## 2018-12-10 DIAGNOSIS — E7849 Other hyperlipidemia: Secondary | ICD-10-CM | POA: Diagnosis not present

## 2018-12-10 DIAGNOSIS — Z6835 Body mass index (BMI) 35.0-35.9, adult: Secondary | ICD-10-CM | POA: Diagnosis not present

## 2018-12-15 ENCOUNTER — Other Ambulatory Visit: Payer: Self-pay | Admitting: Family Medicine

## 2018-12-23 DIAGNOSIS — I5022 Chronic systolic (congestive) heart failure: Secondary | ICD-10-CM | POA: Diagnosis not present

## 2019-01-02 ENCOUNTER — Telehealth: Payer: Self-pay

## 2019-01-02 MED ORDER — ROSUVASTATIN CALCIUM 20 MG PO TABS: 20.0000 mg | ORAL_TABLET | Freq: Every day | ORAL | 11 refills | Status: DC

## 2019-01-02 NOTE — Telephone Encounter (Signed)
Pt states he has finished his Hepatitis C treatment about a month ago and would like to restart Crestor.  If you are okay with that please send a refill to [facility],   Thanks,   -Mickel Baas

## 2019-01-03 DIAGNOSIS — I158 Other secondary hypertension: Secondary | ICD-10-CM | POA: Diagnosis not present

## 2019-01-03 DIAGNOSIS — G4733 Obstructive sleep apnea (adult) (pediatric): Secondary | ICD-10-CM | POA: Diagnosis not present

## 2019-01-06 ENCOUNTER — Ambulatory Visit: Payer: Self-pay | Admitting: Family Medicine

## 2019-01-13 ENCOUNTER — Telehealth: Payer: Self-pay | Admitting: Family Medicine

## 2019-01-13 NOTE — Telephone Encounter (Signed)
Appointment scheduled for 01/14/2019 at [place].

## 2019-01-13 NOTE — Telephone Encounter (Signed)
Pt called saying he is constipated.  This is a new problem  CB#  [phone]  Thanks teri

## 2019-01-14 ENCOUNTER — Ambulatory Visit (INDEPENDENT_AMBULATORY_CARE_PROVIDER_SITE_OTHER): Payer: Medicare HMO | Admitting: Family Medicine

## 2019-01-14 ENCOUNTER — Encounter: Payer: Self-pay | Admitting: Family Medicine

## 2019-01-14 ENCOUNTER — Other Ambulatory Visit: Payer: Self-pay

## 2019-01-14 VITALS — BP 135/86 | HR 69 | Temp 98.5°F | Resp 18 | Wt 249.0 lb

## 2019-01-14 DIAGNOSIS — Z860101 Personal history of adenomatous and serrated colon polyps: Secondary | ICD-10-CM | POA: Insufficient documentation

## 2019-01-14 DIAGNOSIS — Z125 Encounter for screening for malignant neoplasm of prostate: Secondary | ICD-10-CM

## 2019-01-14 DIAGNOSIS — K5900 Constipation, unspecified: Secondary | ICD-10-CM | POA: Diagnosis not present

## 2019-01-14 DIAGNOSIS — Z860100 Personal history of colon polyps, unspecified: Secondary | ICD-10-CM

## 2019-01-14 DIAGNOSIS — I420 Dilated cardiomyopathy: Secondary | ICD-10-CM

## 2019-01-14 DIAGNOSIS — R7303 Prediabetes: Secondary | ICD-10-CM

## 2019-01-14 DIAGNOSIS — E785 Hyperlipidemia, unspecified: Secondary | ICD-10-CM

## 2019-01-14 DIAGNOSIS — I10 Essential (primary) hypertension: Secondary | ICD-10-CM | POA: Diagnosis not present

## 2019-01-14 NOTE — Patient Instructions (Addendum)
.   Please review the attached list of medications and notify my office if there are any errors.   . Please bring all of your medications to every appointment so we can make sure that our medication list is the same as yours.    Start taking one heaping spoonful of Metamucil mixed with water or juice before going to bed at night. And 100-200mg  docusate every day  . Please go to the lab draw station in [address] on the second floor of [facility].

## 2019-01-14 NOTE — Progress Notes (Signed)
Patient: Dylan Lee Male    DOB: February 22, 1956   63 y.o.   MRN: [id number] Visit Date: 01/14/2019  Today's Provider: Lelon Huh, MD   Chief Complaint  Patient presents with  . Constipation    x 1 month   Subjective:     Constipation  This is a new problem. Episode onset: 1 month ago after starting Entresto. The stool is described as firm. There has been adequate water intake. Associated symptoms include abdominal pain, bloating and flatus. Pertinent negatives include no melena, nausea, rectal pain or vomiting. He has tried stool softeners and laxatives (increasing water intake) for the symptoms.   He does have history of colon polyps. His last colonoscopy was done by Dr. Alveta Heimlich in 2006, but he has refused referral for follow up colonoscopy on several occasions since then. Records in [facility] indicated polyps were adenomatous, but I cannot find patholgy report anywhere in his record. No Known Allergies   Current Outpatient Medications:  .  aspirin 81 MG tablet, Take 81 mg by mouth daily., Disp: , Rfl:  .  budesonide-formoterol (SYMBICORT) 160-4.5 MCG/ACT inhaler, Inhale 2 puffs into the lungs 2 (two) times daily., Disp: 1 Inhaler, Rfl: 3 .  furosemide (LASIX) 80 MG tablet, Take 80 mg by mouth 2 (two) times daily. , Disp: , Rfl:  .  metoprolol succinate (TOPROL-XL) 25 MG 24 hr tablet, Take 2 tablets by mouth daily. , Disp: , Rfl:  .  rosuvastatin (CRESTOR) 20 MG tablet, Take 1 tablet (20 mg total) by mouth daily., Disp: 30 tablet, Rfl: 11 .  sacubitril-valsartan (ENTRESTO) 24-26 MG, Take 1 tablet by mouth 2 (two) times a day., Disp: , Rfl:  .  spironolactone (ALDACTONE) 25 MG tablet, Take 0.5 tablets (12.5 mg total) by mouth daily., Disp: 30 tablet, Rfl: 0 .  venlafaxine XR (EFFEXOR-XR) 150 MG 24 hr capsule, Take 1 capsule by mouth once daily, Disp: 90 capsule, Rfl: 1 .  losartan (COZAAR) 50 MG tablet, Take 100 mg by mouth daily., Disp: , Rfl:  .  umeclidinium bromide  (INCRUSE ELLIPTA) 62.5 MCG/INH AEPB, Inhale 1 puff into the lungs daily. Take in the place of Spiriva (Patient not taking: Reported on 01/14/2019), Disp: 30 each, Rfl: 5  Review of Systems  Gastrointestinal: Positive for abdominal pain, bloating, constipation and flatus. Negative for melena, nausea, rectal pain and vomiting.    Social History   Tobacco Use  . Smoking status: Current Some Day Smoker    Years: 20.00    Types: Cigars    Last attempt to quit: 09/17/1988    Years since quitting: 30.3  . Smokeless tobacco: Never Used  . Tobacco comment: smokes 1 cigar daily  Substance Use Topics  . Alcohol use: No    Alcohol/week: 0.0 standard drinks      Objective:   BP 135/86 (BP Location: Left Arm, Cuff Size: Large)   Pulse 69   Temp 98.5 F (36.9 C) (Oral)   Resp 18   Wt 249 lb (112.9 kg)   SpO2 94% Comment: room air  BMI 35.73 kg/m  Vitals:   01/14/19 0903 01/14/19 0905  BP: (!) 143/90 135/86  Pulse: 69   Resp: 18   Temp: 98.5 F (36.9 C)   TempSrc: Oral   SpO2: 94%   Weight: 249 lb (112.9 kg)      Physical Exam  General Appearance:    Alert, cooperative, no distress  Eyes:    PERRL, conjunctiva/corneas  clear, EOM's intact       Lungs:     Clear to auscultation bilaterally, respirations unlabored  Heart:    Regular rate and rhythm  Abdomen:   bowel sounds present and normal in all 4 quadrants, soft or round. Mild diffuse tenderness, no mass, no rebound or guarding.        Assessment & Plan    1. Constipation, unspecified constipation type Onset shortly after starting Entresto prescribed by Dr. Clayborn Bigness March 25th. Counseled on OTC stool softeners and fiber supplements.   2. Congestive dilated cardiomyopathy (HCC) Now on Entresto x 5 weeks.  - Comprehensive metabolic panel  3. History of colon polyps He has repeatedly declined referral for follow up colonoscopy since the last was done in 2006. In light of GI findings he will reconsider this referral.   4.  Benign essential HTN Well controlled.   - Comprehensive metabolic panel  5. Dyslipidemia (high LDL; low HDL) Back on statin for about a month. Was previously changed from his statin to ezetemibe while being treated for hepatitis C - Comprehensive metabolic panel - Lipid panel - TSH - CBC  6. Prostate cancer screening  - PSA  7. Pre-diabetes Due to check- Hemoglobin A1c      Lelon Huh, MD  [facility]

## 2019-01-15 ENCOUNTER — Other Ambulatory Visit: Payer: Self-pay | Admitting: Family Medicine

## 2019-01-15 DIAGNOSIS — Z860100 Personal history of colon polyps, unspecified: Secondary | ICD-10-CM

## 2019-01-15 DIAGNOSIS — K5900 Constipation, unspecified: Secondary | ICD-10-CM

## 2019-01-15 LAB — LIPID PANEL
Chol/HDL Ratio: 4.7 ratio (ref 0.0–5.0)
Cholesterol, Total: 123 mg/dL (ref 100–199)
HDL: 26 mg/dL — ABNORMAL LOW (ref >39)
LDL Calculated: 64 mg/dL (ref 0–99)
Triglycerides: 163 mg/dL — ABNORMAL HIGH (ref 0–149)
VLDL Cholesterol Cal: 33 mg/dL (ref 5–40)

## 2019-01-15 LAB — COMPREHENSIVE METABOLIC PANEL
ALT: 15 IU/L (ref 0–44)
AST: 20 IU/L (ref 0–40)
Albumin/Globulin Ratio: 1.6 (ref 1.2–2.2)
Albumin: 4.1 g/dL (ref 3.8–4.8)
Alkaline Phosphatase: 46 IU/L (ref 39–117)
BUN/Creatinine Ratio: 19 (ref 10–24)
BUN: 27 mg/dL (ref 8–27)
Bilirubin Total: 0.4 mg/dL (ref 0.0–1.2)
CO2: 25 mmol/L (ref 20–29)
Calcium: 9.2 mg/dL (ref 8.6–10.2)
Chloride: 98 mmol/L (ref 96–106)
Creatinine, Ser: 1.43 mg/dL — ABNORMAL HIGH (ref 0.76–1.27)
GFR calc Af Amer: 60 mL/min/{1.73_m2} (ref >59)
GFR calc non Af Amer: 52 mL/min/{1.73_m2} — ABNORMAL LOW (ref >59)
Globulin, Total: 2.5 g/dL (ref 1.5–4.5)
Glucose: 117 mg/dL — ABNORMAL HIGH (ref 65–99)
Potassium: 3.8 mmol/L (ref 3.5–5.2)
Sodium: 141 mmol/L (ref 134–144)
Total Protein: 6.6 g/dL (ref 6.0–8.5)

## 2019-01-15 LAB — CBC
Hematocrit: 46 % (ref 37.5–51.0)
Hemoglobin: 16.1 g/dL (ref 13.0–17.7)
MCH: 32.9 pg (ref 26.6–33.0)
MCHC: 35 g/dL (ref 31.5–35.7)
MCV: 94 fL (ref 79–97)
Platelets: 214 10*3/uL (ref 150–450)
RBC: 4.9 x10E6/uL (ref 4.14–5.80)
RDW: 12.8 % (ref 11.6–15.4)
WBC: 8.7 10*3/uL (ref 3.4–10.8)

## 2019-01-15 LAB — PSA: Prostate Specific Ag, Serum: 0.7 ng/mL (ref 0.0–4.0)

## 2019-01-15 LAB — HEMOGLOBIN A1C
Est. average glucose Bld gHb Est-mCnc: 126 mg/dL
Hgb A1c MFr Bld: 6 % — ABNORMAL HIGH (ref 4.8–5.6)

## 2019-01-15 LAB — TSH: TSH: 2.58 u[IU]/mL (ref 0.450–4.500)

## 2019-01-15 NOTE — Progress Notes (Signed)
Refer gi for history colon polyps and constipation

## 2019-01-16 ENCOUNTER — Telehealth: Payer: Self-pay

## 2019-01-16 DIAGNOSIS — I10 Essential (primary) hypertension: Secondary | ICD-10-CM | POA: Diagnosis not present

## 2019-01-16 DIAGNOSIS — R079 Chest pain, unspecified: Secondary | ICD-10-CM | POA: Diagnosis not present

## 2019-01-16 DIAGNOSIS — G4730 Sleep apnea, unspecified: Secondary | ICD-10-CM | POA: Diagnosis not present

## 2019-01-16 DIAGNOSIS — E785 Hyperlipidemia, unspecified: Secondary | ICD-10-CM | POA: Diagnosis not present

## 2019-01-16 DIAGNOSIS — E7849 Other hyperlipidemia: Secondary | ICD-10-CM | POA: Diagnosis not present

## 2019-01-16 DIAGNOSIS — I5022 Chronic systolic (congestive) heart failure: Secondary | ICD-10-CM | POA: Diagnosis not present

## 2019-01-16 NOTE — Telephone Encounter (Signed)
LMTCB 01/16/2019   Thanks,   -Mickel Baas

## 2019-01-16 NOTE — Telephone Encounter (Signed)
-----   Message from Birdie Sons, MD sent at 01/15/2019 10:13 AM EDT ----- Kidney functions have declined. Need to drink more water. Avoid OTC pain relievers such as ibuprofen and Aleve. Rest of labs are good. Continue current medications.   Reviewed his chart he did have colon polyps on last colonoscopy and is overdue for colonoscopy. He needs referral for follow, especially since he has been having some constipation. Have entered order for referral Go ahead and start OTC metamucil every day for constipation and call if not better in 10-14 days.

## 2019-01-21 ENCOUNTER — Encounter: Payer: Self-pay | Admitting: *Deleted

## 2019-01-21 NOTE — Telephone Encounter (Signed)
Patient advised and verbally voiced understanding. Patient agrees to referral.I saw that GI has already made several attempts to contact patient. I gave patient [facility] 's contact information.

## 2019-02-02 DIAGNOSIS — I158 Other secondary hypertension: Secondary | ICD-10-CM | POA: Diagnosis not present

## 2019-02-02 DIAGNOSIS — G4733 Obstructive sleep apnea (adult) (pediatric): Secondary | ICD-10-CM | POA: Diagnosis not present

## 2019-02-20 ENCOUNTER — Ambulatory Visit: Payer: Self-pay | Admitting: Family Medicine

## 2019-03-05 ENCOUNTER — Telehealth: Payer: Self-pay | Admitting: Family Medicine

## 2019-03-05 DIAGNOSIS — G4733 Obstructive sleep apnea (adult) (pediatric): Secondary | ICD-10-CM | POA: Diagnosis not present

## 2019-03-05 DIAGNOSIS — I158 Other secondary hypertension: Secondary | ICD-10-CM | POA: Diagnosis not present

## 2019-03-05 NOTE — Telephone Encounter (Signed)
Please advise patient that [facility] has been trying to contact him to schedule referral appointment for constipation and colon cancer screening.  Please see referral note and give patient contact information of [facility].

## 2019-03-05 NOTE — Telephone Encounter (Signed)
-----   Message from Birdie Sons, MD sent at 01/15/2019 10:13 AM EDT ----- Regarding: follow up constipation Make sure constipation is better and make sure GI referral for colonoscopy is done.

## 2019-03-06 NOTE — Telephone Encounter (Signed)
Left message to call back  

## 2019-03-10 NOTE — Telephone Encounter (Signed)
LMTCB 03/10/2019  Thanks,   -Rhett Najera  

## 2019-03-11 NOTE — Telephone Encounter (Signed)
Patient states he has not received a call. Patient advised to contact [facility] to schedule appointment.

## 2019-03-24 DIAGNOSIS — I5022 Chronic systolic (congestive) heart failure: Secondary | ICD-10-CM | POA: Diagnosis not present

## 2019-04-21 DIAGNOSIS — G4730 Sleep apnea, unspecified: Secondary | ICD-10-CM | POA: Diagnosis not present

## 2019-04-21 DIAGNOSIS — R079 Chest pain, unspecified: Secondary | ICD-10-CM | POA: Diagnosis not present

## 2019-04-21 DIAGNOSIS — E785 Hyperlipidemia, unspecified: Secondary | ICD-10-CM | POA: Diagnosis not present

## 2019-04-21 DIAGNOSIS — E7849 Other hyperlipidemia: Secondary | ICD-10-CM | POA: Diagnosis not present

## 2019-04-21 DIAGNOSIS — I5022 Chronic systolic (congestive) heart failure: Secondary | ICD-10-CM | POA: Diagnosis not present

## 2019-04-21 DIAGNOSIS — I10 Essential (primary) hypertension: Secondary | ICD-10-CM | POA: Diagnosis not present

## 2019-04-21 DIAGNOSIS — I480 Paroxysmal atrial fibrillation: Secondary | ICD-10-CM | POA: Diagnosis not present

## 2019-04-21 DIAGNOSIS — E6609 Other obesity due to excess calories: Secondary | ICD-10-CM | POA: Diagnosis not present

## 2019-04-21 DIAGNOSIS — Z95810 Presence of automatic (implantable) cardiac defibrillator: Secondary | ICD-10-CM | POA: Diagnosis not present

## 2019-04-21 DIAGNOSIS — R600 Localized edema: Secondary | ICD-10-CM | POA: Diagnosis not present

## 2019-05-20 DIAGNOSIS — R079 Chest pain, unspecified: Secondary | ICD-10-CM | POA: Diagnosis not present

## 2019-05-20 DIAGNOSIS — I10 Essential (primary) hypertension: Secondary | ICD-10-CM | POA: Diagnosis not present

## 2019-05-20 DIAGNOSIS — E6609 Other obesity due to excess calories: Secondary | ICD-10-CM | POA: Diagnosis not present

## 2019-05-20 DIAGNOSIS — I5022 Chronic systolic (congestive) heart failure: Secondary | ICD-10-CM | POA: Diagnosis not present

## 2019-05-20 DIAGNOSIS — G4730 Sleep apnea, unspecified: Secondary | ICD-10-CM | POA: Diagnosis not present

## 2019-05-20 DIAGNOSIS — E7849 Other hyperlipidemia: Secondary | ICD-10-CM | POA: Diagnosis not present

## 2019-05-20 DIAGNOSIS — R600 Localized edema: Secondary | ICD-10-CM | POA: Diagnosis not present

## 2019-05-20 DIAGNOSIS — Z95810 Presence of automatic (implantable) cardiac defibrillator: Secondary | ICD-10-CM | POA: Diagnosis not present

## 2019-05-20 DIAGNOSIS — I480 Paroxysmal atrial fibrillation: Secondary | ICD-10-CM | POA: Diagnosis not present

## 2019-05-20 DIAGNOSIS — E785 Hyperlipidemia, unspecified: Secondary | ICD-10-CM | POA: Diagnosis not present

## 2019-06-12 ENCOUNTER — Other Ambulatory Visit: Payer: Self-pay

## 2019-06-12 ENCOUNTER — Other Ambulatory Visit: Payer: Self-pay | Admitting: Family Medicine

## 2019-06-12 ENCOUNTER — Encounter: Payer: Self-pay | Admitting: Family Medicine

## 2019-06-12 ENCOUNTER — Ambulatory Visit (INDEPENDENT_AMBULATORY_CARE_PROVIDER_SITE_OTHER): Payer: Medicare HMO | Admitting: Family Medicine

## 2019-06-12 VITALS — BP 110/71 | HR 70 | Temp 96.6°F | Resp 16 | Wt 250.2 lb

## 2019-06-12 DIAGNOSIS — R7303 Prediabetes: Secondary | ICD-10-CM

## 2019-06-12 DIAGNOSIS — F329 Major depressive disorder, single episode, unspecified: Secondary | ICD-10-CM

## 2019-06-12 DIAGNOSIS — M2550 Pain in unspecified joint: Secondary | ICD-10-CM | POA: Diagnosis not present

## 2019-06-12 DIAGNOSIS — I420 Dilated cardiomyopathy: Secondary | ICD-10-CM | POA: Diagnosis not present

## 2019-06-12 DIAGNOSIS — I2510 Atherosclerotic heart disease of native coronary artery without angina pectoris: Secondary | ICD-10-CM

## 2019-06-12 DIAGNOSIS — F32A Depression, unspecified: Secondary | ICD-10-CM

## 2019-06-12 DIAGNOSIS — Z23 Encounter for immunization: Secondary | ICD-10-CM

## 2019-06-12 DIAGNOSIS — R69 Illness, unspecified: Secondary | ICD-10-CM | POA: Diagnosis not present

## 2019-06-12 DIAGNOSIS — Z1211 Encounter for screening for malignant neoplasm of colon: Secondary | ICD-10-CM | POA: Diagnosis not present

## 2019-06-12 LAB — POCT GLYCOSYLATED HEMOGLOBIN (HGB A1C): Hemoglobin A1C: 6 % — AB (ref 4.0–5.6)

## 2019-06-12 MED ORDER — VENLAFAXINE HCL ER 150 MG PO CP24: 150.0000 mg | ORAL_CAPSULE | Freq: Every day | ORAL | 4 refills | Status: DC

## 2019-06-12 NOTE — Progress Notes (Signed)
Patient: Dylan Lee Male    DOB: 1956/04/24   63 y.o.   MRN: [id number] Visit Date: 06/12/2019  Today's Provider: Lelon Huh, MD   Chief Complaint  Patient presents with  . Hypertension  . Hyperglycemia   Subjective:     HPI  Prediabetes, Follow-up:   Lab Results  Component Value Date   HGBA1C 6.0 (H) 01/14/2019   HGBA1C 5.8 09/06/2014   GLUCOSE 117 (H) 01/14/2019   GLUCOSE 111 (H) 03/27/2018   GLUCOSE 95 12/05/2015    Last seen for for this5 months ago.  Management since that visit includes none. Current symptoms include polyuria and have been unchanged.  Weight trend: stable Prior visit with dietician: no Current diet: in general, a "healthy" diet   Current exercise: walking  Pertinent Labs:    Component Value Date/Time   CHOL 123 01/14/2019 0936   TRIG 163 (H) 01/14/2019 0936   CHOLHDL 4.7 01/14/2019 0936   CREATININE 1.43 (H) 01/14/2019 0936    Wt Readings from Last 3 Encounters:  06/12/19 250 lb 3.2 oz (113.5 kg)  01/14/19 249 lb (112.9 kg)  09/29/18 241 lb (109.3 kg)    Hypertension, follow-up:  BP Readings from Last 3 Encounters:  06/12/19 110/71  01/14/19 135/86  09/29/18 120/80    He was last seen for hypertension 5 months ago.  BP at that visit was 135/86. Management changes since that visit include advising patient to drink more water and avoid otc pain relievers due to kidney function declining. Marland Kitchen He reports excellent compliance with treatment. He is not having side effects.  He is exercising. He is adherent to low salt diet.   Outside blood pressures are not being checked. He is experiencing none.  Patient denies chest pain, chest pressure/discomfort, claudication, dyspnea, exertional chest pressure/discomfort, fatigue, irregular heart beat, lower extremity edema, near-syncope, orthopnea, palpitations, paroxysmal nocturnal dyspnea, syncope and tachypnea.   Cardiovascular risk factors include advanced age (older than 49  for men, 34 for women), hypertension, male gender and smoking/ tobacco exposure.  Use of agents associated with hypertension: NSAIDS.     Weight trend: stable Wt Readings from Last 3 Encounters:  06/12/19 250 lb 3.2 oz (113.5 kg)  01/14/19 249 lb (112.9 kg)  09/29/18 241 lb (109.3 kg)    Current diet: in general, a "healthy" diet    ------------------------------------------------------------------------  He also complains of generalized joint pains including shoulder,  Hips, fingers, hands for many years and getting worse he states Dr. Alveta Heimlich diagnosed him with rheumatoid arthritis many years ago, but has never seen a rheumatologist. . He is requesting something to help with arthritis pains. Has been taking Tylenol which doesn't help.   No Known Allergies   Current Outpatient Medications:  .  aspirin 81 MG tablet, Take 81 mg by mouth daily., Disp: , Rfl:  .  budesonide-formoterol (SYMBICORT) 160-4.5 MCG/ACT inhaler, Inhale 2 puffs into the lungs 2 (two) times daily., Disp: 1 Inhaler, Rfl: 3 .  ezetimibe (ZETIA) 10 MG tablet, Take 10 mg by mouth daily., Disp: , Rfl:  .  furosemide (LASIX) 80 MG tablet, Take 80 mg by mouth 2 (two) times daily (NOT TAKING). , Disp: , Rfl:  .  losartan (COZAAR) 50 MG tablet, Take 100 mg by mouth daily., Disp: , Rfl:  .  metoprolol succinate (TOPROL-XL) 25 MG 24 hr tablet, Take 2 tablets by mouth daily. , Disp: , Rfl:  .  rosuvastatin (CRESTOR) 20 MG tablet, Take 1  tablet (20 mg total) by mouth daily., Disp: 30 tablet, Rfl: 11 .  sacubitril-valsartan (ENTRESTO) 24-26 MG, Take 1 tablet by mouth 2 (two) times a day., Disp: , Rfl:  .  spironolactone (ALDACTONE) 25 MG tablet, Take 0.5 tablets (12.5 mg total) by mouth daily., Disp: 30 tablet, Rfl: 0 .  torsemide (DEMADEX) 20 MG tablet, , Disp: , Rfl:  .  venlafaxine XR (EFFEXOR-XR) 150 MG 24 hr capsule, Take 1 capsule by mouth once daily, Disp: 90 capsule, Rfl: 1  Review of Systems  Constitutional: Negative  for appetite change, chills and fever.  Respiratory: Negative for chest tightness, shortness of breath and wheezing.   Cardiovascular: Negative for chest pain and palpitations.  Gastrointestinal: Negative for abdominal pain, nausea and vomiting.    Social History   Tobacco Use  . Smoking status: Current Some Day Smoker    Years: 20.00    Types: Cigars    Last attempt to quit: 09/17/1988    Years since quitting: 30.7  . Smokeless tobacco: Never Used  . Tobacco comment: smokes 1 cigar daily  Substance Use Topics  . Alcohol use: No    Alcohol/week: 0.0 standard drinks      Objective:   BP 110/71   Pulse 70   Temp (!) 96.6 F (35.9 C) (Oral)   Resp 16   Wt 250 lb 3.2 oz (113.5 kg)   SpO2 91%   BMI 35.90 kg/m  Vitals:   06/12/19 1027  BP: 110/71  Pulse: 70  Resp: 16  Temp: (!) 96.6 F (35.9 C)  TempSrc: Oral  SpO2: 91%  Weight: 250 lb 3.2 oz (113.5 kg)  Body mass index is 35.9 kg/m.   Physical Exam   General Appearance:    Obese male in no acute distress  Eyes:    PERRL, conjunctiva/corneas clear, EOM's intact       Lungs:     Clear to auscultation bilaterally, respirations unlabored  Heart:    Normal heart rate. Normal rhythm. No murmurs, rubs, or gallops.   MS:   All extremities are intact.   Neurologic:   Awake, alert, oriented x 3. No apparent focal neurological           defect.        Results for orders placed or performed in visit on 06/12/19  Results for orders placed or performed in visit on 06/12/19  POCT glycosylated hemoglobin (Hb A1C)  Result Value Ref Range   Hemoglobin A1C 6.0 (A) 4.0 - 5.6 %       Assessment & Plan    1. Pre-diabetes Stable. Check a1c q 6 months.   2. Need for influenza vaccination  - Flu Vaccine QUAD 36+ mos IM  3. Arthralgia, unspecified joint Patient reports remote history of rheumatoid arthritis, continue routine follow up with Dr. Clayborn Bigness - Rheumatoid Factor - Cyclic citrul peptide antibody, IgG - ANA  Direct w/Reflex if Positive - CYCLIC CITRUL PEPTIDE ANTIBODY, IGG/IGA  4. Congestive dilated cardiomyopathy (HCC) Stable on current meds,  - Comprehensive metabolic panel - CBC  5. Colon cancer screening Patient sent with iFOBT cards.   6. Depressive disorder Continue venlafaxine, refilled today.    The entirety of the information documented in the History of Present Illness, Review of Systems and Physical Exam were personally obtained by me. Portions of this information were initially documented by Minette Headland, CMA and reviewed by me for thoroughness and accuracy.      Lelon Huh, MD  [facility]

## 2019-06-15 ENCOUNTER — Telehealth: Payer: Self-pay

## 2019-06-15 DIAGNOSIS — R768 Other specified abnormal immunological findings in serum: Secondary | ICD-10-CM

## 2019-06-15 LAB — COMPREHENSIVE METABOLIC PANEL
ALT: 12 IU/L (ref 0–44)
AST: 14 IU/L (ref 0–40)
Albumin/Globulin Ratio: 1.5 (ref 1.2–2.2)
Albumin: 4.1 g/dL (ref 3.8–4.8)
Alkaline Phosphatase: 48 IU/L (ref 39–117)
BUN/Creatinine Ratio: 17 (ref 10–24)
BUN: 23 mg/dL (ref 8–27)
Bilirubin Total: 0.4 mg/dL (ref 0.0–1.2)
CO2: 26 mmol/L (ref 20–29)
Calcium: 9.6 mg/dL (ref 8.6–10.2)
Chloride: 100 mmol/L (ref 96–106)
Creatinine, Ser: 1.39 mg/dL — ABNORMAL HIGH (ref 0.76–1.27)
GFR calc Af Amer: 61 mL/min/{1.73_m2} (ref >59)
GFR calc non Af Amer: 53 mL/min/{1.73_m2} — ABNORMAL LOW (ref >59)
Globulin, Total: 2.7 g/dL (ref 1.5–4.5)
Glucose: 114 mg/dL — ABNORMAL HIGH (ref 65–99)
Potassium: 4 mmol/L (ref 3.5–5.2)
Sodium: 142 mmol/L (ref 134–144)
Total Protein: 6.8 g/dL (ref 6.0–8.5)

## 2019-06-15 LAB — CBC
Hematocrit: 46.9 % (ref 37.5–51.0)
Hemoglobin: 15.9 g/dL (ref 13.0–17.7)
MCH: 31 pg (ref 26.6–33.0)
MCHC: 33.9 g/dL (ref 31.5–35.7)
MCV: 91 fL (ref 79–97)
Platelets: 232 10*3/uL (ref 150–450)
RBC: 5.13 x10E6/uL (ref 4.14–5.80)
RDW: 12.8 % (ref 11.6–15.4)
WBC: 9.2 10*3/uL (ref 3.4–10.8)

## 2019-06-15 LAB — CYCLIC CITRUL PEPTIDE ANTIBODY, IGG/IGA: Cyclic Citrullin Peptide Ab: 7 units (ref 0–19)

## 2019-06-15 LAB — ANA W/REFLEX IF POSITIVE
Anti JO-1: <0.2 AI (ref 0.0–0.9)
Anti Nuclear Antibody (ANA): POSITIVE — AB
Centromere Ab Screen: <0.2 AI (ref 0.0–0.9)
Chromatin Ab SerPl-aCnc: <0.2 AI (ref 0.0–0.9)
ENA RNP Ab: 3.8 AI — ABNORMAL HIGH (ref 0.0–0.9)
ENA SM Ab Ser-aCnc: <0.2 AI (ref 0.0–0.9)
ENA SSA (RO) Ab: <0.2 AI (ref 0.0–0.9)
ENA SSB (LA) Ab: <0.2 AI (ref 0.0–0.9)
Scleroderma (Scl-70) (ENA) Antibody, IgG: <0.2 AI (ref 0.0–0.9)
dsDNA Ab: <1 IU/mL (ref 0–9)

## 2019-06-15 LAB — RHEUMATOID FACTOR: Rheumatoid fact SerPl-aCnc: <10 IU/mL (ref 0.0–13.9)

## 2019-06-15 NOTE — Telephone Encounter (Signed)
-----   Message from Birdie Sons, MD sent at 06/14/2019  8:30 AM EDT ----- Patient has elevated ANA which is a sign of autoimmune disease that could be causing joint pains. Needs referral to rheumatology Is also over due for colon cancer screening. Either needs referral to GI or OC-Lyte test done.

## 2019-06-15 NOTE — Telephone Encounter (Signed)
Patient advised as below and agrees with referral to Rheumatology. Referral order placed. Patient advised about colon cancer screen. Patient says he was given an OC light take home kit during his last office visit. Patient plans to bring the sample back to the office after he gets a changed to collect a specimen.

## 2019-06-15 NOTE — Telephone Encounter (Signed)
Unable to reach patient at this time phone continually rings. KW 

## 2019-06-22 NOTE — Patient Instructions (Signed)
.   Please review the attached list of medications and notify my office if there are any errors.   . Please bring all of your medications to every appointment so we can make sure that our medication list is the same as yours.   . It is especially important to get the annual flu vaccine this year. If you haven't had it already, please go to your pharmacy or call the office as soon as possible to schedule you flu shot.  

## 2019-07-07 DIAGNOSIS — I5022 Chronic systolic (congestive) heart failure: Secondary | ICD-10-CM | POA: Diagnosis not present

## 2019-07-15 DIAGNOSIS — Z79899 Other long term (current) drug therapy: Secondary | ICD-10-CM | POA: Diagnosis not present

## 2019-07-15 DIAGNOSIS — I5022 Chronic systolic (congestive) heart failure: Secondary | ICD-10-CM | POA: Diagnosis not present

## 2019-07-15 DIAGNOSIS — L4050 Arthropathic psoriasis, unspecified: Secondary | ICD-10-CM | POA: Insufficient documentation

## 2019-07-15 DIAGNOSIS — R768 Other specified abnormal immunological findings in serum: Secondary | ICD-10-CM | POA: Diagnosis not present

## 2019-07-15 DIAGNOSIS — L409 Psoriasis, unspecified: Secondary | ICD-10-CM | POA: Diagnosis not present

## 2019-07-15 DIAGNOSIS — R69 Illness, unspecified: Secondary | ICD-10-CM | POA: Diagnosis not present

## 2019-08-31 DIAGNOSIS — R768 Other specified abnormal immunological findings in serum: Secondary | ICD-10-CM | POA: Diagnosis not present

## 2019-08-31 DIAGNOSIS — L409 Psoriasis, unspecified: Secondary | ICD-10-CM | POA: Diagnosis not present

## 2019-08-31 DIAGNOSIS — Z79899 Other long term (current) drug therapy: Secondary | ICD-10-CM | POA: Insufficient documentation

## 2019-08-31 DIAGNOSIS — L4050 Arthropathic psoriasis, unspecified: Secondary | ICD-10-CM | POA: Diagnosis not present

## 2019-09-04 IMAGING — US US ABDOMEN LIMITED W/ ELASTOGRAPHY
1 series · 13 of 25 positions shown · non-contrast
Comparison: None.

CLINICAL DATA: Chronic hepatitis-C without hepatic coma.

EXAM:
US ABDOMEN LIMITED - RIGHT UPPER QUADRANT
ULTRASOUND HEPATIC ELASTOGRAPHY
TECHNIQUE: Limited right upper quadrant abdominal ultrasound was performed. In
addition, ultrasound elastography evaluation of the liver was
performed. A region of interest was placed in the right lobe of the
liver. Following application of a compressive sonographic pulse,
shear waves were detected in the adjacent hepatic tissue and the
shear wave velocity was calculated. Multiple assessments were
performed at the selected site. Median shear wave velocity is
correlated to a Metavir fibrosis score.

[Series 1: us abdomen limited w/ elastography · 13 of 46 slices shown]
[im 1/46]
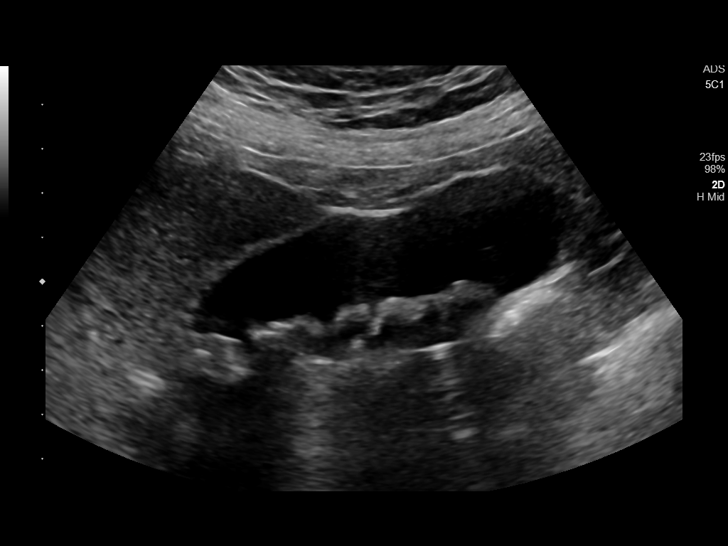
[im 4/46]
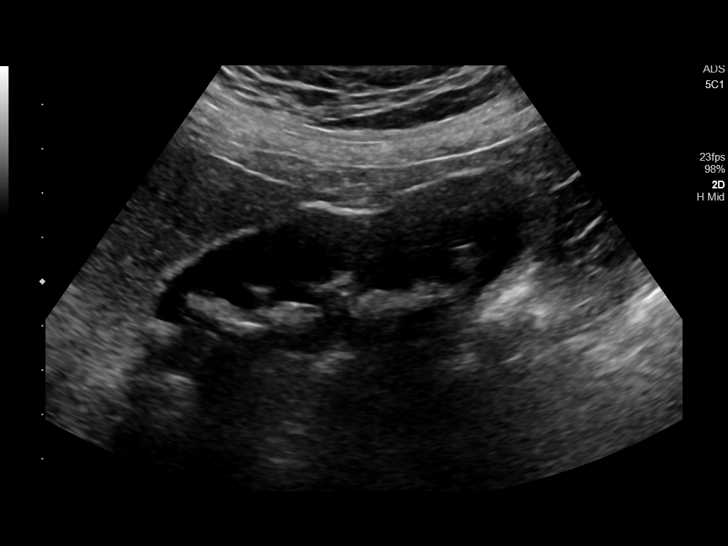
[im 8/46]
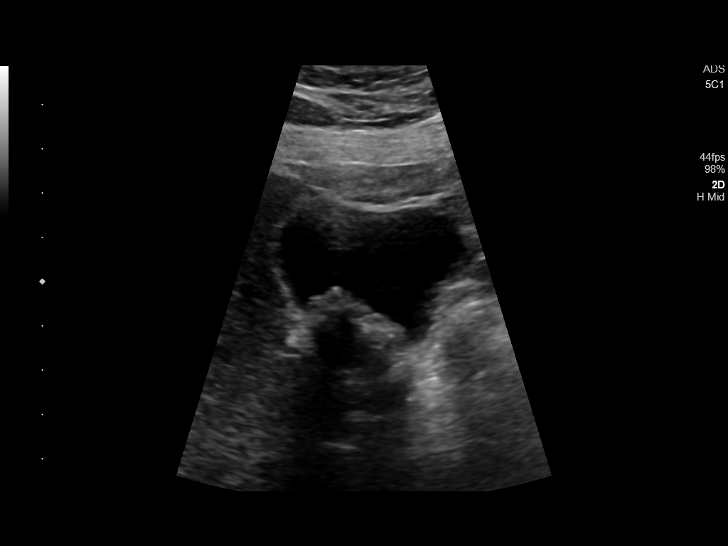
[im 12/46]
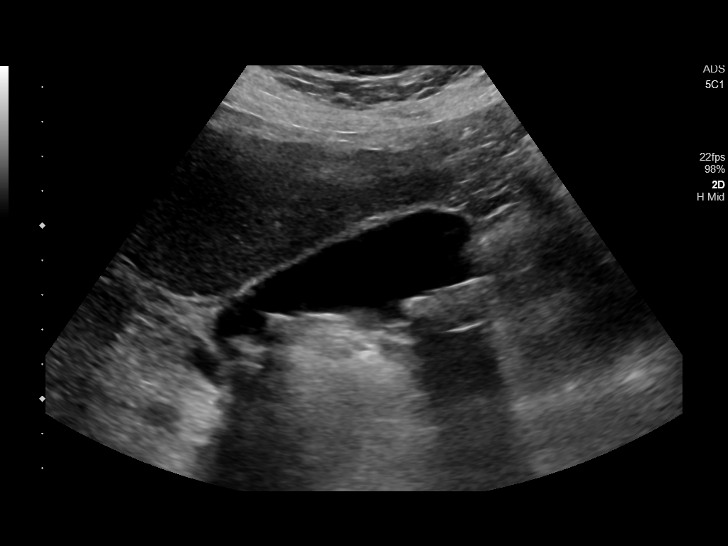
[im 16/46]
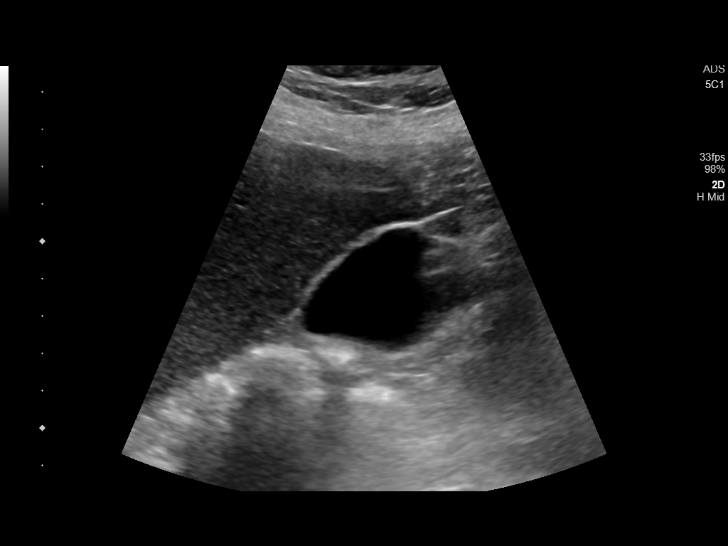
[im 19/46]
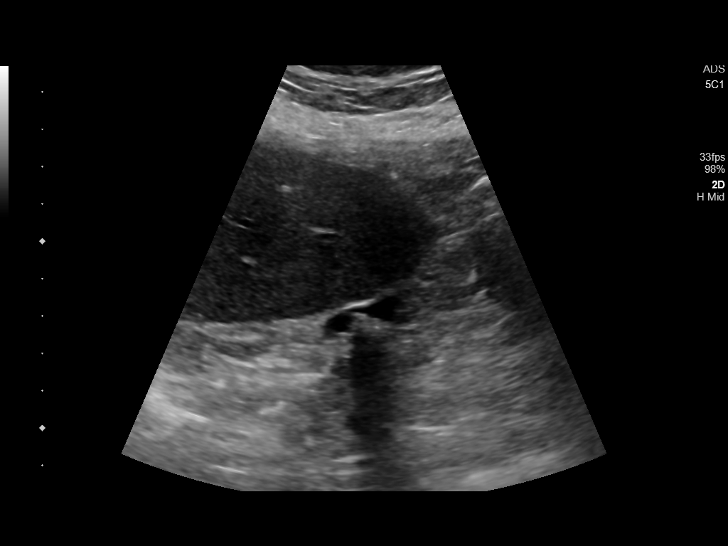
[im 23/46]
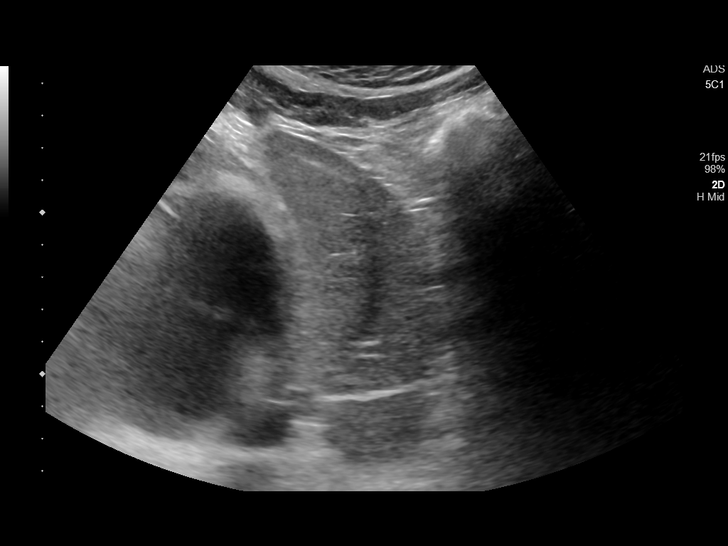
[im 27/46]
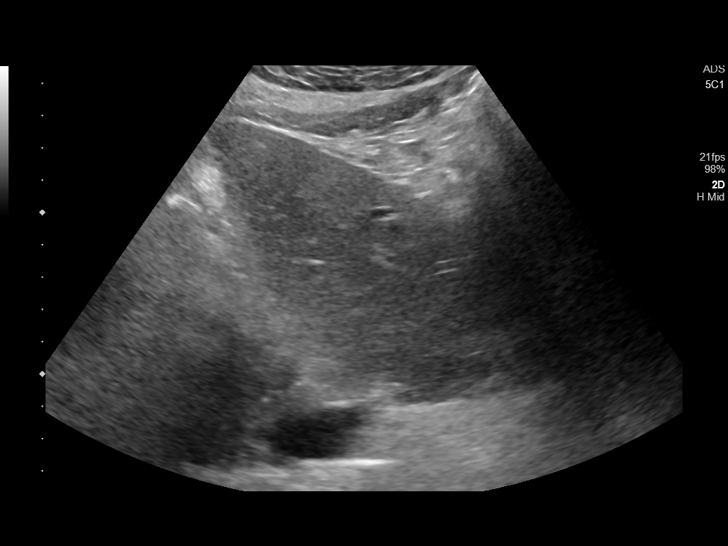
[im 31/46]
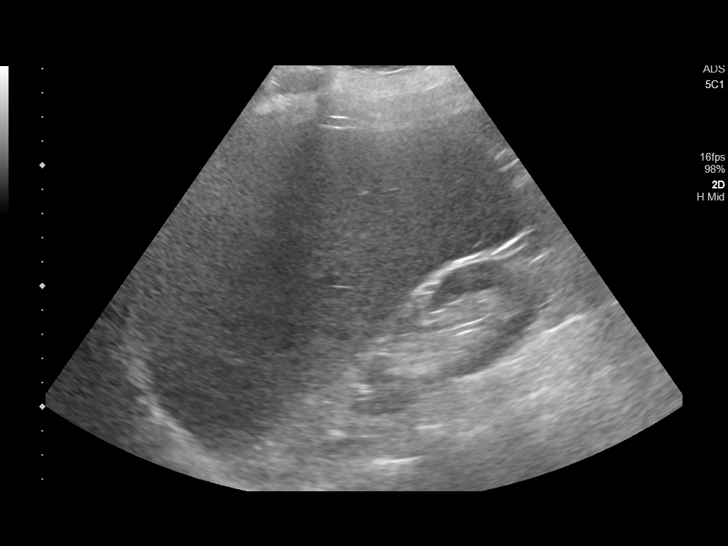
[im 34/46]
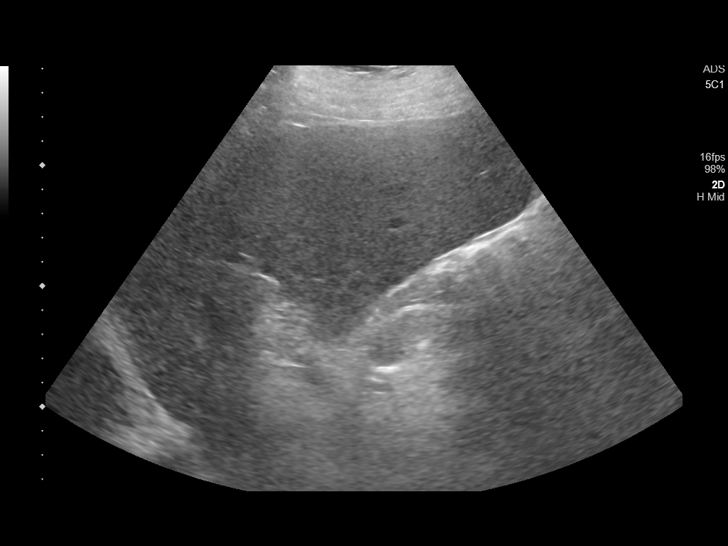
[im 38/46]
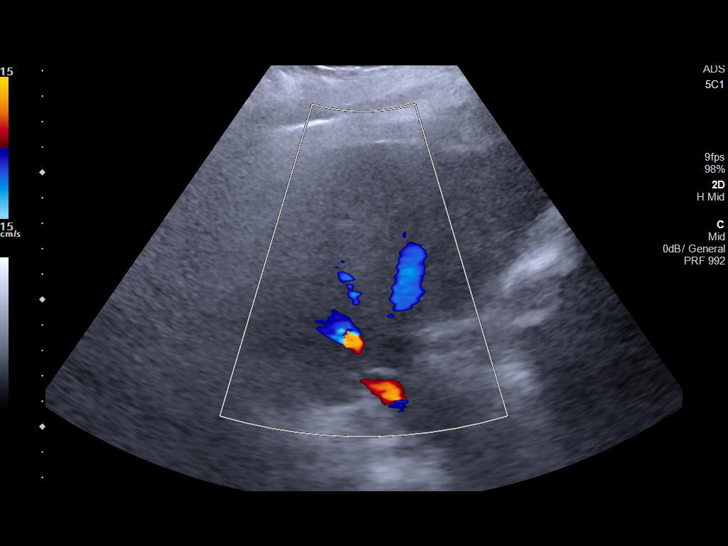
[im 42/46]
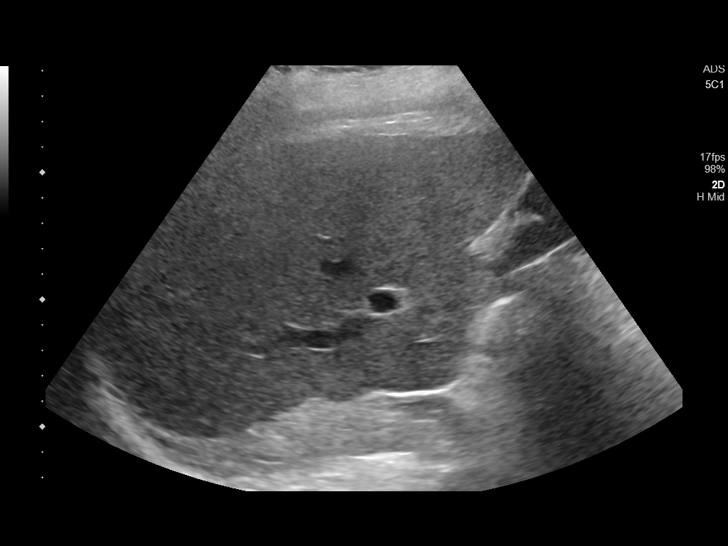
[im 46/46]
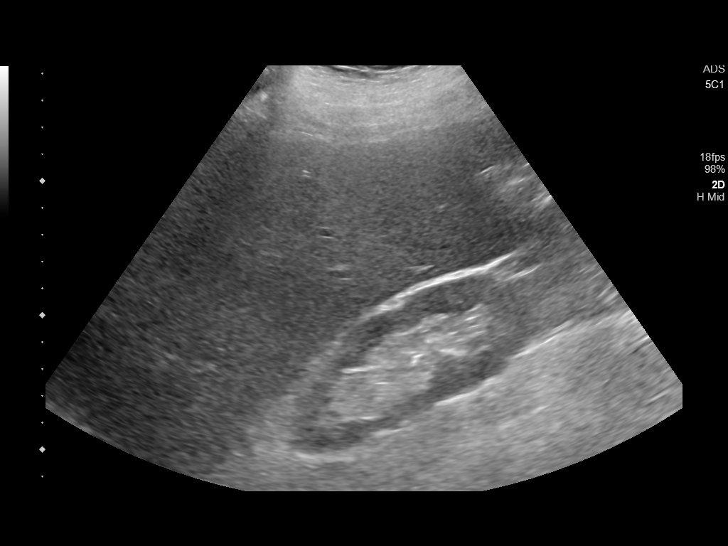

[13 of 25 positions shown; findings below may reference images not displayed]

FINDINGS: ULTRASOUND ABDOMEN LIMITED RIGHT UPPER QUADRANT

Gallbladder:

Multiple gallstones are seen measuring up to 1.3 cm. No evidence of
gallbladder wall thickening or pericholecystic fluid. No sonographic
Murphy sign noted by sonographer.

Common bile duct:

Diameter: 5 mm, within normal limits.

Liver:

No focal lesion identified. Within normal limits in parenchymal
echogenicity. Portal vein is patent on color Doppler imaging with
normal direction of blood flow towards the liver.

ULTRASOUND HEPATIC ELASTOGRAPHY

Device: Siemens Helix VTQ

Patient position: Supine

Transducer 4V1

Number of measurements: 10

Hepatic segment:  8

Median velocity:   1.08 m/sec

IQR:

IQR/Median velocity ratio:

Corresponding Metavir fibrosis score:  F0/F1

Risk of fibrosis: Minimal

Limitations of exam: Patient unable to perform breath holding during
exam

Please note that abnormal shear wave velocities may also be
identified in clinical settings other than with hepatic fibrosis,
such as: acute hepatitis, elevated right heart and central venous
pressures including use of beta blockers, Jupa disease
(Oi Teng), infiltrative processes such as
mastocytosis/amyloidosis/infiltrative tumor, extrahepatic
cholestasis, in the post-prandial state, and liver transplantation.
Correlation with patient history, laboratory data, and clinical
condition recommended.
IMPRESSION: ULTRASOUND ABDOMEN:

Cholelithiasis. No sonographic evidence of cholecystitis or biliary
ductal dilatation.

Unremarkable sonographic appearance of liver.

ULTRASOUND HEPATIC ELASTOGRAHY:

Median hepatic shear wave velocity is calculated at 1.08 m/sec.

Corresponding Metavir fibrosis score is F0/F1.

Risk of fibrosis is Minimal.

Follow-up: None required

## 2019-09-07 DIAGNOSIS — E785 Hyperlipidemia, unspecified: Secondary | ICD-10-CM | POA: Diagnosis not present

## 2019-09-07 DIAGNOSIS — G4730 Sleep apnea, unspecified: Secondary | ICD-10-CM | POA: Diagnosis not present

## 2019-09-07 DIAGNOSIS — I5022 Chronic systolic (congestive) heart failure: Secondary | ICD-10-CM | POA: Diagnosis not present

## 2019-09-07 DIAGNOSIS — I480 Paroxysmal atrial fibrillation: Secondary | ICD-10-CM | POA: Diagnosis not present

## 2019-09-07 DIAGNOSIS — I10 Essential (primary) hypertension: Secondary | ICD-10-CM | POA: Diagnosis not present

## 2019-09-07 DIAGNOSIS — R600 Localized edema: Secondary | ICD-10-CM | POA: Diagnosis not present

## 2019-09-07 DIAGNOSIS — E7849 Other hyperlipidemia: Secondary | ICD-10-CM | POA: Diagnosis not present

## 2019-09-07 DIAGNOSIS — E6609 Other obesity due to excess calories: Secondary | ICD-10-CM | POA: Diagnosis not present

## 2019-09-07 DIAGNOSIS — R69 Illness, unspecified: Secondary | ICD-10-CM | POA: Diagnosis not present

## 2019-09-07 DIAGNOSIS — Z95810 Presence of automatic (implantable) cardiac defibrillator: Secondary | ICD-10-CM | POA: Diagnosis not present

## 2019-10-05 DIAGNOSIS — G4733 Obstructive sleep apnea (adult) (pediatric): Secondary | ICD-10-CM | POA: Diagnosis not present

## 2019-10-27 DIAGNOSIS — I5022 Chronic systolic (congestive) heart failure: Secondary | ICD-10-CM | POA: Diagnosis not present

## 2019-11-05 DIAGNOSIS — G4733 Obstructive sleep apnea (adult) (pediatric): Secondary | ICD-10-CM | POA: Diagnosis not present

## 2019-12-03 ENCOUNTER — Other Ambulatory Visit: Payer: Self-pay | Admitting: Family Medicine

## 2019-12-03 DIAGNOSIS — G4733 Obstructive sleep apnea (adult) (pediatric): Secondary | ICD-10-CM | POA: Diagnosis not present

## 2020-02-02 DIAGNOSIS — I5022 Chronic systolic (congestive) heart failure: Secondary | ICD-10-CM | POA: Diagnosis not present

## 2020-03-07 ENCOUNTER — Other Ambulatory Visit
Admission: RE | Admit: 2020-03-07 | Discharge: 2020-03-07 | Disposition: A | Discharge status: Home / Self Care | Payer: Medicare HMO | Source: Ambulatory Visit | Attending: Student | Admitting: Student

## 2020-03-07 DIAGNOSIS — G4733 Obstructive sleep apnea (adult) (pediatric): Secondary | ICD-10-CM | POA: Diagnosis not present

## 2020-03-07 DIAGNOSIS — I2510 Atherosclerotic heart disease of native coronary artery without angina pectoris: Secondary | ICD-10-CM | POA: Diagnosis not present

## 2020-03-07 DIAGNOSIS — E782 Mixed hyperlipidemia: Secondary | ICD-10-CM | POA: Insufficient documentation

## 2020-03-07 DIAGNOSIS — I5020 Unspecified systolic (congestive) heart failure: Secondary | ICD-10-CM | POA: Diagnosis not present

## 2020-03-07 DIAGNOSIS — R0600 Dyspnea, unspecified: Secondary | ICD-10-CM | POA: Diagnosis not present

## 2020-03-07 DIAGNOSIS — R69 Illness, unspecified: Secondary | ICD-10-CM | POA: Diagnosis not present

## 2020-03-07 DIAGNOSIS — R0609 Other forms of dyspnea: Secondary | ICD-10-CM | POA: Diagnosis not present

## 2020-03-07 DIAGNOSIS — Z79899 Other long term (current) drug therapy: Secondary | ICD-10-CM | POA: Insufficient documentation

## 2020-03-07 DIAGNOSIS — I10 Essential (primary) hypertension: Secondary | ICD-10-CM | POA: Diagnosis not present

## 2020-03-07 DIAGNOSIS — R079 Chest pain, unspecified: Secondary | ICD-10-CM | POA: Diagnosis not present

## 2020-03-07 DIAGNOSIS — I428 Other cardiomyopathies: Secondary | ICD-10-CM | POA: Diagnosis not present

## 2020-03-07 LAB — CBC AND DIFFERENTIAL
HCT: 49 (ref 41–53)
Hemoglobin: 16.6 (ref 13.5–17.5)
Platelets: 256 (ref 150–399)
WBC: 8.5

## 2020-03-07 LAB — COMPREHENSIVE METABOLIC PANEL
Albumin: 4.6 (ref 3.5–5.0)
Calcium: 9.3 (ref 8.7–10.7)
GFR calc non Af Amer: 47

## 2020-03-07 LAB — HEPATIC FUNCTION PANEL
ALT: 10 (ref 10–40)
AST: 15 (ref 14–40)
Alkaline Phosphatase: 51 (ref 25–125)
Bilirubin, Total: 0.6

## 2020-03-07 LAB — BASIC METABOLIC PANEL
BUN: 18 (ref 4–21)
CO2: 33 — AB (ref 13–22)
Chloride: 103 (ref 99–108)
Creatinine: 1.5 — AB (ref 0.6–1.3)
Glucose: 98
Potassium: 4.2 (ref 3.4–5.3)
Sodium: 142 (ref 137–147)

## 2020-03-07 LAB — BRAIN NATRIURETIC PEPTIDE: B Natriuretic Peptide: 199.8 pg/mL — ABNORMAL HIGH (ref 0.0–100.0)

## 2020-03-07 LAB — CBC: RBC: 4.96 (ref 3.87–5.11)

## 2020-03-07 LAB — LIPID PANEL
Cholesterol: 185 (ref 0–200)
HDL: 30 — AB (ref 35–70)
LDL Cholesterol: 117
Triglycerides: 192 — AB (ref 40–160)

## 2020-03-10 ENCOUNTER — Other Ambulatory Visit: Payer: Self-pay | Admitting: Family Medicine

## 2020-03-10 DIAGNOSIS — J449 Chronic obstructive pulmonary disease, unspecified: Secondary | ICD-10-CM

## 2020-03-25 DIAGNOSIS — G4733 Obstructive sleep apnea (adult) (pediatric): Secondary | ICD-10-CM | POA: Diagnosis not present

## 2020-03-25 DIAGNOSIS — Z789 Other specified health status: Secondary | ICD-10-CM | POA: Diagnosis not present

## 2020-03-25 DIAGNOSIS — R0602 Shortness of breath: Secondary | ICD-10-CM | POA: Diagnosis not present

## 2020-03-25 DIAGNOSIS — J449 Chronic obstructive pulmonary disease, unspecified: Secondary | ICD-10-CM | POA: Diagnosis not present

## 2020-03-25 DIAGNOSIS — R69 Illness, unspecified: Secondary | ICD-10-CM | POA: Diagnosis not present

## 2020-04-13 DIAGNOSIS — I5020 Unspecified systolic (congestive) heart failure: Secondary | ICD-10-CM | POA: Diagnosis not present

## 2020-04-13 DIAGNOSIS — R079 Chest pain, unspecified: Secondary | ICD-10-CM | POA: Diagnosis not present

## 2020-04-13 DIAGNOSIS — R0609 Other forms of dyspnea: Secondary | ICD-10-CM | POA: Diagnosis not present

## 2020-04-19 ENCOUNTER — Telehealth: Payer: Self-pay | Admitting: Family Medicine

## 2020-04-19 NOTE — Telephone Encounter (Signed)
Copied from [place] [phone]. Topic: Medicare AWV >> Apr 19, 2020 10:57 AM Cher Nakai R wrote: Reason for CRM:   Left message for patient to call back and schedule Medicare Annual Wellness Visit (AWV) either virtually or in office.  Last AWV 09/29/2018  Please schedule at anytime with [facility] Health Advisor.

## 2020-04-27 DIAGNOSIS — Z95810 Presence of automatic (implantable) cardiac defibrillator: Secondary | ICD-10-CM | POA: Diagnosis not present

## 2020-04-27 DIAGNOSIS — G4733 Obstructive sleep apnea (adult) (pediatric): Secondary | ICD-10-CM | POA: Diagnosis not present

## 2020-04-27 DIAGNOSIS — I5020 Unspecified systolic (congestive) heart failure: Secondary | ICD-10-CM | POA: Diagnosis not present

## 2020-04-27 DIAGNOSIS — I428 Other cardiomyopathies: Secondary | ICD-10-CM | POA: Diagnosis not present

## 2020-04-27 DIAGNOSIS — I2510 Atherosclerotic heart disease of native coronary artery without angina pectoris: Secondary | ICD-10-CM | POA: Diagnosis not present

## 2020-04-27 DIAGNOSIS — I10 Essential (primary) hypertension: Secondary | ICD-10-CM | POA: Diagnosis not present

## 2020-04-27 DIAGNOSIS — R0609 Other forms of dyspnea: Secondary | ICD-10-CM | POA: Diagnosis not present

## 2020-04-27 DIAGNOSIS — I480 Paroxysmal atrial fibrillation: Secondary | ICD-10-CM | POA: Diagnosis not present

## 2020-04-27 DIAGNOSIS — E782 Mixed hyperlipidemia: Secondary | ICD-10-CM | POA: Diagnosis not present

## 2020-04-27 DIAGNOSIS — R600 Localized edema: Secondary | ICD-10-CM | POA: Diagnosis not present

## 2020-05-10 DIAGNOSIS — I5022 Chronic systolic (congestive) heart failure: Secondary | ICD-10-CM | POA: Diagnosis not present

## 2020-05-31 ENCOUNTER — Other Ambulatory Visit: Payer: Self-pay | Admitting: Family Medicine

## 2020-05-31 DIAGNOSIS — R0602 Shortness of breath: Secondary | ICD-10-CM | POA: Diagnosis not present

## 2020-05-31 DIAGNOSIS — Z01818 Encounter for other preprocedural examination: Secondary | ICD-10-CM | POA: Diagnosis not present

## 2020-05-31 DIAGNOSIS — J449 Chronic obstructive pulmonary disease, unspecified: Secondary | ICD-10-CM | POA: Diagnosis not present

## 2020-05-31 DIAGNOSIS — R05 Cough: Secondary | ICD-10-CM | POA: Diagnosis not present

## 2020-05-31 DIAGNOSIS — G4733 Obstructive sleep apnea (adult) (pediatric): Secondary | ICD-10-CM | POA: Diagnosis not present

## 2020-05-31 NOTE — Telephone Encounter (Signed)
Requested medications are due for refill today? Yes  Requested medications are on active medication list?  Yes  Last Refill:   12/03/2019  # 90 with no refills   Future visit scheduled?  No   Notes to Clinic:  Medication failed Rx refill protocol due to no labs within the past 360 days.  Last labs were performed on 01/14/2019.

## 2020-06-01 NOTE — Telephone Encounter (Signed)
Have sent one refill, but he is due for follow up visit and needs to schedule within the next month.

## 2020-06-22 ENCOUNTER — Ambulatory Visit: Payer: Medicare HMO | Admitting: Family Medicine

## 2020-08-18 DIAGNOSIS — G4733 Obstructive sleep apnea (adult) (pediatric): Secondary | ICD-10-CM | POA: Diagnosis not present

## 2020-08-23 DIAGNOSIS — I5022 Chronic systolic (congestive) heart failure: Secondary | ICD-10-CM | POA: Diagnosis not present

## 2020-09-12 ENCOUNTER — Encounter: Payer: Self-pay | Admitting: Family Medicine

## 2020-09-12 ENCOUNTER — Other Ambulatory Visit: Payer: Self-pay

## 2020-09-12 ENCOUNTER — Ambulatory Visit (INDEPENDENT_AMBULATORY_CARE_PROVIDER_SITE_OTHER): Payer: Medicare HMO | Admitting: Family Medicine

## 2020-09-12 VITALS — BP 161/92 | HR 74 | Temp 96.4°F | Resp 18 | Wt 245.0 lb

## 2020-09-12 DIAGNOSIS — Z23 Encounter for immunization: Secondary | ICD-10-CM | POA: Diagnosis not present

## 2020-09-12 DIAGNOSIS — I10 Essential (primary) hypertension: Secondary | ICD-10-CM

## 2020-09-12 DIAGNOSIS — J449 Chronic obstructive pulmonary disease, unspecified: Secondary | ICD-10-CM | POA: Diagnosis not present

## 2020-09-12 DIAGNOSIS — M069 Rheumatoid arthritis, unspecified: Secondary | ICD-10-CM | POA: Diagnosis not present

## 2020-09-12 DIAGNOSIS — I420 Dilated cardiomyopathy: Secondary | ICD-10-CM

## 2020-09-12 DIAGNOSIS — Z125 Encounter for screening for malignant neoplasm of prostate: Secondary | ICD-10-CM | POA: Diagnosis not present

## 2020-09-12 DIAGNOSIS — I2510 Atherosclerotic heart disease of native coronary artery without angina pectoris: Secondary | ICD-10-CM

## 2020-09-12 DIAGNOSIS — R7303 Prediabetes: Secondary | ICD-10-CM | POA: Diagnosis not present

## 2020-09-12 DIAGNOSIS — F32A Depression, unspecified: Secondary | ICD-10-CM

## 2020-09-12 DIAGNOSIS — F329 Major depressive disorder, single episode, unspecified: Secondary | ICD-10-CM

## 2020-09-12 DIAGNOSIS — R69 Illness, unspecified: Secondary | ICD-10-CM | POA: Diagnosis not present

## 2020-09-12 LAB — POCT GLYCOSYLATED HEMOGLOBIN (HGB A1C)
Est. average glucose Bld gHb Est-mCnc: 117
Hemoglobin A1C: 5.7 % — AB (ref 4.0–5.6)

## 2020-09-12 MED ORDER — VENLAFAXINE HCL ER 75 MG PO CP24: 75.0000 mg | ORAL_CAPSULE | Freq: Every day | ORAL | 3 refills | Status: DC

## 2020-09-12 NOTE — Progress Notes (Signed)
Established patient visit   Patient: Dylan Lee   DOB: [birth year]   64 y.o. Male  MRN: [id number] Visit Date: 09/12/2020  Today's healthcare provider: Lelon Huh, MD   Chief Complaint  Patient presents with  . Prediabetes  . Hypertension  . Depression   Subjective    HPI  Prediabetes, Follow-up  Lab Results  Component Value Date   HGBA1C 5.7 (A) 09/12/2020   HGBA1C 6.0 (A) 06/12/2019   HGBA1C 6.0 (H) 01/14/2019   GLUCOSE 114 (H) 06/12/2019   GLUCOSE 117 (H) 01/14/2019   GLUCOSE 111 (H) 03/27/2018    Last seen for for this1 year ago.  Management since that visit includes continuing health diet. Current symptoms include none and have been stable.  Prior visit with dietician: no Current diet: well balanced Current exercise: none  Pertinent Labs:    Component Value Date/Time   CHOL 123 01/14/2019 0936   TRIG 163 (H) 01/14/2019 0936   CHOLHDL 4.7 01/14/2019 0936   CREATININE 1.39 (H) 06/12/2019 1135    Wt Readings from Last 3 Encounters:  09/12/20 245 lb (111.1 kg)  06/12/19 250 lb 3.2 oz (113.5 kg)  01/14/19 249 lb (112.9 kg)    -----------------------------------------------------------------------------------------  Depression, Follow-up  He  was last seen for this 1 year ago. Changes made at last visit include none; continue Venlafaxine.   He reports good compliance with treatment. He is not having side effects.   He reports good tolerance of treatment. Current symptoms include: depressed mood He feels he is Worse since last visit.  Depression screen [facility] 2/9 09/12/2020 09/29/2018  Decreased Interest 1 2  Down, Depressed, Hopeless 1 2  PHQ - 2 Score 2 4  Altered sleeping 2 1  Tired, decreased energy 2 2  Change in appetite 0 1  Feeling bad or failure about yourself  2 1  Trouble concentrating 1 1  Moving slowly or fidgety/restless 1 1  Suicidal thoughts 1 0  PHQ-9 Score 11 11  Difficult doing work/chores Very difficult Very  difficult    -----------------------------------------------------------------------------------------  Hypertension, follow-up  BP Readings from Last 3 Encounters:  09/12/20 (!) 161/92  06/12/19 110/71  01/14/19 135/86   Wt Readings from Last 3 Encounters:  09/12/20 245 lb (111.1 kg)  06/12/19 250 lb 3.2 oz (113.5 kg)  01/14/19 249 lb (112.9 kg)     He was last seen for hypertension 1 year ago.  BP at that visit was 135/86. Management since that visit includes continue same medication.  He reports good compliance with treatment. He is not having side effects.  He is following a Regular diet. He is not exercising. He does smoke.  Use of agents associated with hypertension: NSAIDS.   Outside blood pressures are not checked. Symptoms: Yes chest pain No chest pressure  No palpitations No syncope  Yes dyspnea No orthopnea  No paroxysmal nocturnal dyspnea Yes lower extremity edema    ---------------------------------------------------------------------------------------------------  He also reports that he has had more trouble with joint pains and psoriasis since running out of methotrexate previously prescribed by Dr. Posey Pronto. He has not been able to get in for follow up due to covid pandemic.      Medications: Outpatient Medications Prior to Visit  Medication Sig  . aspirin 81 MG tablet Take 81 mg by mouth daily.  . Methotrexate 25 MG/ML SOSY Inject into the skin.  . metoprolol succinate (TOPROL-XL) 25 MG 24 hr tablet Take 2 tablets by mouth daily.   Marland Kitchen  rosuvastatin (CRESTOR) 20 MG tablet Take 1 tablet by mouth once daily  . sacubitril-valsartan (ENTRESTO) 24-26 MG Take 1 tablet by mouth 2 (two) times a day.  . SYMBICORT 160-4.5 MCG/ACT inhaler INHALE 2 PUFFS INTO LUNGS TWICE DAILY  . torsemide (DEMADEX) 20 MG tablet   . venlafaxine XR (EFFEXOR-XR) 150 MG 24 hr capsule Take 1 capsule (150 mg total) by mouth daily.  . [DISCONTINUED] ezetimibe (ZETIA) 10 MG tablet Take 10  mg by mouth daily. (Patient not taking: Reported on 09/12/2020)  . [DISCONTINUED] losartan (COZAAR) 50 MG tablet Take 100 mg by mouth daily. (Patient not taking: No sig reported)  . [DISCONTINUED] spironolactone (ALDACTONE) 25 MG tablet Take 0.5 tablets (12.5 mg total) by mouth daily. (Patient not taking: No sig reported)   No facility-administered medications prior to visit.    Review of Systems  Constitutional: Negative for appetite change, chills and fever.  Respiratory: Positive for shortness of breath. Negative for chest tightness and wheezing.   Cardiovascular: Positive for chest pain and leg swelling. Negative for palpitations.  Gastrointestinal: Negative for abdominal pain, nausea and vomiting.  Psychiatric/Behavioral: Positive for dysphoric mood.      Objective    BP (!) 161/92 (BP Location: Left Arm, Patient Position: Sitting, Cuff Size: Large)   Pulse 74   Temp (!) 96.4 F (35.8 C) (Temporal)   Resp 18   Wt 245 lb (111.1 kg)   SpO2 94% Comment: room air  BMI 35.15 kg/m    Physical Exam   General: Appearance:    Mildly obese male in no acute distress  Eyes:    PERRL, conjunctiva/corneas clear, EOM's intact       Lungs:     Clear to auscultation bilaterally, respirations unlabored  Heart:    Normal heart rate. Normal rhythm. No murmurs, rubs, or gallops.   MS:   All extremities are intact.   Neurologic:   Awake, alert, oriented x 3. No apparent focal neurological           defect.         Results for orders placed or performed in visit on 09/12/20  POCT HgB A1C  Result Value Ref Range   Hemoglobin A1C 5.7 (A) 4.0 - 5.6 %   Est. average glucose Bld gHb Est-mCnc 117     Assessment & Plan     1. Pre-diabetes A1c is stable. Continue to check twice yearly.   2. Prostate cancer screening  - PSA Total (Reflex To Free) ([facility] only)  3. Benign essential HTN Fairly well controlled, Continue current medications.    4. Coronary artery disease involving native  coronary artery of native heart without angina pectoris Asymptomatic. Compliant with medication.  Continue ECASA. Consider adding statin.  - CBC - Comprehensive metabolic panel - Lipid panel  5. Need for vaccination against Streptococcus pneumoniae  - Pneumococcal conjugate vaccine 13-valent IM  Recommended he get Covid and flu vaccines, he declined both.   6. Rheumatoid arthritis involving multiple sites, unspecified whether rheumatoid factor present [facility]) Currently out of methotrexate previously prescribed by Dr. Posey Pronto. He reports worsening arthralgias since being out. If labs look good will refill to get by until he is able to follow up with rheumatology.   7. Chronic obstructive pulmonary disease, unspecified COPD type ([place]) Previously on Symbicort, but now out. Will restart since he is having some dyspnea.   8. Congestive dilated cardiomyopathy ([place]) Doing well on Entresto, encouraged to take consistently. Follow up Dr. Clayborn Bigness in March as  scheduled.   Addressed extensive list of chronic and acute medical problems today requiring 45 minutes reviewing his medical record, counseling patient regarding his conditions and coordination of care.      9. Major depression Increase venlafaxine to 225 daily by adding additional 75mg  capsule to the 225.      The entirety of the information documented in the History of Present Illness, Review of Systems and Physical Exam were personally obtained by me. Portions of this information were initially documented by the CMA and reviewed by me for thoroughness and accuracy.      Lelon Huh, MD  [facility] [phone] (phone) [phone] (fax)  [place]

## 2020-09-12 NOTE — Patient Instructions (Addendum)
.   Please review the attached list of medications and notify my office if there are any errors.   . Covid-19 vaccines: The Covid vaccines have been given to hundreds of millions of people and found to be very effective and are as safe as any other vaccine.  The [employer] vaccine has been associated with very rare dangerous blood clots, but only in adult women under the age of 60.  The risk of dying from Covid infections is much higher than having a serious reaction to the vaccine.  I strongly recommend getting fully vaccinated against Covid-19.  I recommend that adult women under 60 get fully vaccinated, but the Moderna and Pfizer vaccines may be safer for those women than the Johnson [employer] vaccine.   

## 2020-09-13 ENCOUNTER — Other Ambulatory Visit: Payer: Self-pay | Admitting: Family Medicine

## 2020-09-13 DIAGNOSIS — M069 Rheumatoid arthritis, unspecified: Secondary | ICD-10-CM

## 2020-09-13 DIAGNOSIS — J449 Chronic obstructive pulmonary disease, unspecified: Secondary | ICD-10-CM

## 2020-09-13 DIAGNOSIS — L4050 Arthropathic psoriasis, unspecified: Secondary | ICD-10-CM

## 2020-09-13 LAB — LIPID PANEL
Chol/HDL Ratio: 9.2 ratio — ABNORMAL HIGH (ref 0.0–5.0)
Cholesterol, Total: 239 mg/dL — ABNORMAL HIGH (ref 100–199)
HDL: 26 mg/dL — ABNORMAL LOW (ref >39)
LDL Chol Calc (NIH): 170 mg/dL — ABNORMAL HIGH (ref 0–99)
Triglycerides: 228 mg/dL — ABNORMAL HIGH (ref 0–149)
VLDL Cholesterol Cal: 43 mg/dL — ABNORMAL HIGH (ref 5–40)

## 2020-09-13 LAB — COMPREHENSIVE METABOLIC PANEL
ALT: 10 IU/L (ref 0–44)
AST: 14 IU/L (ref 0–40)
Albumin/Globulin Ratio: 1.6 (ref 1.2–2.2)
Albumin: 4 g/dL (ref 3.8–4.8)
Alkaline Phosphatase: 53 IU/L (ref 44–121)
BUN/Creatinine Ratio: 15 (ref 10–24)
BUN: 22 mg/dL (ref 8–27)
Bilirubin Total: 0.4 mg/dL (ref 0.0–1.2)
CO2: 27 mmol/L (ref 20–29)
Calcium: 9.3 mg/dL (ref 8.6–10.2)
Chloride: 102 mmol/L (ref 96–106)
Creatinine, Ser: 1.5 mg/dL — ABNORMAL HIGH (ref 0.76–1.27)
GFR calc Af Amer: 56 mL/min/{1.73_m2} — ABNORMAL LOW (ref >59)
GFR calc non Af Amer: 48 mL/min/{1.73_m2} — ABNORMAL LOW (ref >59)
Globulin, Total: 2.5 g/dL (ref 1.5–4.5)
Glucose: 103 mg/dL — ABNORMAL HIGH (ref 65–99)
Potassium: 4.5 mmol/L (ref 3.5–5.2)
Sodium: 141 mmol/L (ref 134–144)
Total Protein: 6.5 g/dL (ref 6.0–8.5)

## 2020-09-13 LAB — CBC
Hematocrit: 48.4 % (ref 37.5–51.0)
Hemoglobin: 16.1 g/dL (ref 13.0–17.7)
MCH: 31.3 pg (ref 26.6–33.0)
MCHC: 33.3 g/dL (ref 31.5–35.7)
MCV: 94 fL (ref 79–97)
Platelets: 225 10*3/uL (ref 150–450)
RBC: 5.14 x10E6/uL (ref 4.14–5.80)
RDW: 12.9 % (ref 11.6–15.4)
WBC: 7.6 10*3/uL (ref 3.4–10.8)

## 2020-09-13 LAB — PSA TOTAL (REFLEX TO FREE): Prostate Specific Ag, Serum: 1 ng/mL (ref 0.0–4.0)

## 2020-09-13 MED ORDER — SYMBICORT 160-4.5 MCG/ACT IN AERO: INHALATION_SPRAY | RESPIRATORY_TRACT | 12 refills | Status: DC

## 2020-09-13 MED ORDER — METHOTREXATE 25 MG/ML ~~LOC~~ SOSY
0.6000 mL | PREFILLED_SYRINGE | SUBCUTANEOUS | 0 refills | Status: DC
Start: 2020-09-13 — End: 2020-12-23

## 2020-09-14 ENCOUNTER — Telehealth: Payer: Self-pay

## 2020-09-14 DIAGNOSIS — E785 Hyperlipidemia, unspecified: Secondary | ICD-10-CM

## 2020-09-14 MED ORDER — ROSUVASTATIN CALCIUM 40 MG PO TABS: 40.0000 mg | ORAL_TABLET | Freq: Every day | ORAL | 3 refills | Status: DC

## 2020-09-14 NOTE — Telephone Encounter (Signed)
Patient advised and verbalized understanding,. Patient agrees with treatment plan. Prescription sent into pharmacy. Follow up appointment scheduled for 12/14/2019 at 10:20am.

## 2020-09-14 NOTE — Telephone Encounter (Signed)
-----   Message from [name], MD sent at 09/13/2020  7:08 AM EST ----- Cholesterol is much too high at 239. Needs to be under 180.  Need to increase rosuvastatin to 40mg  once a day, #90, rf x 3. Schedule follow up in 3 months.

## 2020-09-18 DIAGNOSIS — G4733 Obstructive sleep apnea (adult) (pediatric): Secondary | ICD-10-CM | POA: Diagnosis not present

## 2020-09-29 NOTE — Progress Notes (Signed)
Subjective:   Dylan Lee is a 65 y.o. male who presents for an Initial Medicare Annual Wellness Visit.  I connected with Dylan Lee today by telephone and verified that I am speaking with the correct person using two identifiers. Location patient: home Location provider: work Persons participating in the virtual visit: patient, provider.   I discussed the limitations, risks, security and privacy concerns of performing an evaluation and management service by telephone and the availability of in person appointments. I also discussed with the patient that there may be a patient responsible charge related to this service. The patient expressed understanding and verbally consented to this telephonic visit.    Interactive audio and video telecommunications were attempted between this provider and patient, however failed, due to patient having technical difficulties OR patient did not have access to video capability.  We continued and completed visit with audio only.   Review of Systems    N/A  Cardiac Risk Factors include: advanced age (>34mn, >>72women);dyslipidemia;hypertension;male gender     Objective:    Today's Vitals   10/03/20 1403  PainSc: 3    There is no height or weight on file to calculate BMI.  Advanced Directives 10/03/2020 06/03/2015 05/25/2015 03/02/2015  Does Patient Have a Medical Advance Directive? No No No No  Would patient like information on creating a medical advance directive? No - Patient declined No - patient declined information No - patient declined information No - patient declined information    Current Medications (verified) Outpatient Encounter Medications as of 10/03/2020  Medication Sig  . aspirin 81 MG tablet Take 81 mg by mouth daily.  . folic acid (FOLVITE) 1 MG tablet Take 1 mg by mouth daily.  . Methotrexate 25 MG/ML SOSY Inject 0.6 mLs into the skin once a week.  . metoprolol succinate (TOPROL-XL) 25 MG 24 hr tablet Take 2 tablets by  mouth daily.   . rosuvastatin (CRESTOR) 40 MG tablet Take 1 tablet (40 mg total) by mouth daily.  . sacubitril-valsartan (ENTRESTO) 24-26 MG Take 1 tablet by mouth 2 (two) times a day.  . SYMBICORT 160-4.5 MCG/ACT inhaler INHALE 2 PUFFS INTO LUNGS TWICE DAILY  . torsemide (DEMADEX) 20 MG tablet Take 10 mg by mouth daily.  .Marland Kitchenvenlafaxine XR (EFFEXOR XR) 75 MG 24 hr capsule Take 1 capsule (75 mg total) by mouth daily. Take in addition to a 1540mcapsule daily  . venlafaxine XR (EFFEXOR-XR) 150 MG 24 hr capsule Take 1 capsule (150 mg total) by mouth daily.   No facility-administered encounter medications on file as of 10/03/2020.    Allergies (verified) Patient has no known allergies.   History: Past Medical History:  Diagnosis Date  . CHF (congestive heart failure) ([facility]  . Hepatitis C    Hep C treated   . History of chicken pox   . History of measles   . History of mumps   . Hyperlipidemia   . Hypertension   . Kidney stones   . Shingles 09/06/2015   Past Surgical History:  Procedure Laterality Date  . BUNIONECTOMY    . CARDIAC CATHETERIZATION N/A 03/02/2015   Procedure: Right and Left Heart Cath;  Surgeon: DwYolonda KidaMD;  [facility];  Service: Cardiovascular;  Laterality: N/A;  . DENTAL SURGERY    . FOOT SURGERY Right   . IMPLANTABLE CARDIOVERTER DEFIBRILLATOR (ICD) GENERATOR CHANGE Right 06/03/2015   Procedure: ICD generator insertion ;  Surgeon: KeMarzetta BoardMD;  Location: [facility];  Service: Cardiovascular;  Laterality: Right;  . LITHOTRIPSY    . sleep study  07/2010   severe OSA, snoring. Bilevel pressure at 19/14. Jiengel. ENT consult/ Jiengel: no ENT surgical indication for OSA  . SPIROMETRY  10/20/2014   Severe obstruction   Family History  Problem Relation Age of Onset  . Hypertension Mother   . Bipolar disorder Mother   . Ovarian cancer Mother   . Heart disease Mother   . Diabetes Father   . Alcohol abuse Father   . CAD Father    . Depression Father   . Alcohol abuse Sister   . Congestive Heart Failure Brother   . Alcohol abuse Brother   . Liver cancer Brother    Social History   Socioeconomic History  . Marital status: Married    Spouse name: Not on file  . Number of children: 0  . Years of education: Not on file  . Highest education level: High school graduate  Occupational History  . 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 Smoking status: Current Some Day Smoker    Years: 20.00    Types: Cigars    Last attempt to quit: 09/17/1988    Years since quitting: 32.0  . Smokeless tobacco: Never Used  . Tobacco comment: smokes 1 cigar daily; quit cigs. 30+ years ago  Vaping Use  . Vaping Use: Never used  Substance and Sexual Activity  . Alcohol use: No    Alcohol/week: 0.0 standard drinks  . Drug use: No  . Sexual activity: Not on file  Other Topics Concern  . Not on file  Social History Narrative  . Not on file   Social Determinants of Health   Financial Resource Strain: Low Risk   . Difficulty of Paying Living Expenses: Not very hard  Food Insecurity: No Food Insecurity  . Worried About Charity fundraiser in the Last Year: Never true  . Ran Out of Food in the Last Year: Never true  Transportation Needs: No Transportation Needs  . Lack of Transportation (Medical): No  . Lack of Transportation (Non-Medical): No  Physical Activity: Inactive  . Days of Exercise per Week: 0 days  . Minutes of Exercise per Session: 0 min  Stress: No Stress Concern Present  . Feeling of Stress : Not at all  Social Connections: Moderately Isolated  . Frequency of Communication with Friends and Family: More than three times a week  . Frequency of Social Gatherings with Friends and Family: Once a week  . Attends Religious Services: Never  . Active Member of Clubs or Organizations: No  . Attends Archivist Meetings: Never  . Marital Status: Married    Tobacco  Counseling Ready to quit: Not Answered Counseling given: Not Answered Comment: smokes 1 cigar daily; quit cigs. 30+ years ago   Clinical Intake:  Pre-visit preparation completed: Yes  Pain : 0-10 Pain Score: 3  Pain Type: Chronic pain Pain Location: Knee (both knees and both elbows) Pain Orientation: Right,Left Pain Descriptors / Indicators: Aching (arthritis) Pain Frequency: Intermittent Pain Relieving Factors: Currently on MTX once a week for arthritis pains.  Pain Relieving Factors: Currently on MTX once a week for arthritis pains.  Nutritional Risks: None Diabetes: No  How often do you need to have someone help you when you read instructions, pamphlets, or other written materials from your doctor or pharmacy?: 1 - Never  Diabetic? No  Interpreter Needed?: No  Information entered by :: [facility], LPN   Activities of Daily Living In your present state of health, do you have any difficulty performing the following activities: 10/03/2020 09/12/2020  Hearing? Y Y  Comment Has hearing loss in the left ear. Does not wear hearing aids. -  Vision? N N  Difficulty concentrating or making decisions? N N  Walking or climbing stairs? N Y  Dressing or bathing? N N  Doing errands, shopping? N N  Preparing Food and eating ? N -  Using the Toilet? N -  In the past six months, have you accidently leaked urine? N -  Do you have problems with loss of bowel control? N -  Managing your Medications? N -  Managing your Finances? N -  Housekeeping or managing your Housekeeping? N -  Some recent data might be hidden    Patient Care Team: Birdie Sons, MD as PCP - General (Family Medicine) Manya Silvas, MD (Inactive) (Gastroenterology) Yolonda Kida, MD as Consulting Physician (Cardiology) Marlowe Sax, MD as Referring Physician (Internal Medicine) Ocie Doyne, [place] (Optometry)  Indicate any recent Medical Services you may have received from other than [facility]  providers in the past year (date may be approximate).     Assessment:   This is a routine wellness examination for Dylan Lee.  Hearing/Vision screen No exam data present  Dietary issues and exercise activities discussed: Current Exercise Habits: The patient does not participate in regular exercise at present  Goals    . Exercise 3x per week (30 min per time)     Recommend to exercise for 3 days a week for at least 30 minutes at a time.       Depression Screen PHQ 2/9 Scores 10/03/2020 09/12/2020 09/29/2018  PHQ - 2 Score 0 2 4  PHQ- 9 Score - 11 11    Fall Risk Fall Risk  10/03/2020 09/29/2018  Falls in the past year? 0 0  Number falls in past yr: 0 0  Injury with Fall? 0 0    FALL RISK PREVENTION PERTAINING TO THE HOME:  Any stairs in or around the home? Yes  If so, are there any without handrails? No  Home free of loose throw rugs in walkways, pet beds, electrical cords, etc? Yes  Adequate lighting in your home to reduce risk of falls? Yes   ASSISTIVE DEVICES UTILIZED TO PREVENT FALLS:  Life alert? No  Use of a cane, walker or w/c? No  Grab bars in the bathroom? Yes  Shower chair or bench in shower? No  Elevated toilet seat or a handicapped toilet? No    Cognitive Function: Declined today.        Immunizations Immunization History  Administered Date(s) Administered  . Influenza Split 06/24/2009, 06/22/2010  . Influenza,inj,Quad PF,6+ Mos 05/28/2018, 06/12/2019  . Influenza-Unspecified 07/19/2017  . Pneumococcal Conjugate-13 09/12/2020  . Pneumococcal Polysaccharide-23 06/24/2009    TDAP status: Due, Education has been provided regarding the importance of this vaccine. Advised may receive this vaccine at local pharmacy or Health Dept. Aware to provide a copy of the vaccination record if obtained from local pharmacy or Health Dept. Verbalized acceptance and understanding.  Flu Vaccine status: Due, Education has been provided regarding the importance of this  vaccine. Advised may receive this vaccine at local pharmacy or Health Dept. Aware to provide a copy of the vaccination record if obtained from local pharmacy or Health Dept. Verbalized acceptance and understanding.  Pneumococcal vaccine status: Up to date  Covid-19 vaccine status: Declined, Education has been provided regarding the importance of this vaccine but patient still declined. Advised may receive this vaccine at local pharmacy or Health Dept.or vaccine clinic. Aware to provide a copy of the vaccination record if obtained from local pharmacy or Health Dept. Verbalized acceptance and understanding.  Qualifies for Shingles Vaccine? Yes   Zostavax completed No   Shingrix Completed?: No.    Education has been provided regarding the importance of this vaccine. Patient has been advised to call insurance company to determine out of pocket expense if they have not yet received this vaccine. Advised may also receive vaccine at local pharmacy or Health Dept. Verbalized acceptance and understanding.  Screening Tests Health Maintenance  Topic Date Due  . HIV Screening  Never done  . COLONOSCOPY (Pts 45-29yr Insurance coverage will need to be confirmed)  02/15/2015  . COVID-19 Vaccine (1) 10/19/2020 (Originally 05/11/1961)  . INFLUENZA VACCINE  12/15/2020 (Originally 04/17/2020)  . TETANUS/TDAP  10/03/2021 (Originally [birth year]1974)  . PNA vac Low Risk Adult (2 of 2 - PPSV23) 09/12/2021  . Hepatitis C Screening  Completed    Health Maintenance  Health Maintenance Due  Topic Date Due  . HIV Screening  Never done  . COLONOSCOPY (Pts 45-482yrInsurance coverage will need to be confirmed)  02/15/2015    Colorectal cancer screening: Currently due. Pt to completed cologuard kit that insurance company sent him. Requested results once completed.  Lung Cancer Screening: (Low Dose CT Chest recommended if Age 607-80ears, 30 pack-year currently smoking OR have quit w/in 15years.) does not qualify.    Additional Screening:  Hepatitis C Screening: Up to date  Vision Screening: Recommended annual ophthalmology exams for early detection of glaucoma and other disorders of the eye. Is the patient up to date with their annual eye exam?  Yes  Who is the provider or what is the name of the office in which the patient attends annual eye exams? Dr ShWyatt Portelaf pt is not established with a provider, would they like to be referred to a provider to establish care? No .   Dental Screening: Recommended annual dental exams for proper oral hygiene  Community Resource Referral / Chronic Care Management: CRR required this visit?  No   CCM required this visit?  No      Plan:     I have personally reviewed and noted the following in the patient's chart:   . Medical and social history . Use of alcohol, tobacco or illicit drugs  . Current medications and supplements . Functional ability and status . Nutritional status . Physical activity . Advanced directives . List of other physicians . Hospitalizations, surgeries, and ER visits in previous 12 months . Vitals . Screenings to include cognitive, depression, and falls . Referrals and appointments  In addition, I have reviewed and discussed with patient certain preventive protocols, quality metrics, and best practice recommendations. A written personalized care plan for preventive services as well as general preventive health recommendations were provided to patient.     Kathlen Sakurai [place] 09/26/87/2119 Nurse Notes: Pt declined a colonoscopy referral at this time. Pt states his insurance company has mailed him a coEducation officer, environmentalnd he plans to complete that. Requested that he send us[place]orea[place] results once received. Pt declined receiving a flu or Covid vaccine.

## 2020-10-03 ENCOUNTER — Ambulatory Visit (INDEPENDENT_AMBULATORY_CARE_PROVIDER_SITE_OTHER): Payer: Medicare HMO

## 2020-10-03 ENCOUNTER — Telehealth: Payer: Self-pay

## 2020-10-03 ENCOUNTER — Other Ambulatory Visit: Payer: Self-pay | Admitting: Family Medicine

## 2020-10-03 ENCOUNTER — Other Ambulatory Visit: Payer: Self-pay

## 2020-10-03 DIAGNOSIS — Z0000 Encounter for general adult medical examination without abnormal findings: Secondary | ICD-10-CM | POA: Diagnosis not present

## 2020-10-03 NOTE — Telephone Encounter (Signed)
Copied from [place] [id number]. Topic: General - Other >> Oct 03, 2020 10:05 AM Hinda Lenis D wrote: PT call to reschedule his medicare wellness appt for today . Please advise

## 2020-10-03 NOTE — Telephone Encounter (Signed)
AWV completed this afternoon.

## 2020-10-03 NOTE — Patient Instructions (Signed)
[name] , Thank you for taking time to come for your Medicare Wellness Visit. I appreciate your ongoing commitment to your health goals. Please review the following plan we discussed and let me know if I can assist you in the future.   Screening recommendations/referrals: Colonoscopy: Currently due. Please complete cologuard kit from [employer] and advise office of results.  Recommended yearly ophthalmology/optometry visit for glaucoma screening and checkup Recommended yearly dental visit for hygiene and checkup  Vaccinations: Influenza vaccine: Currently due, declined receiving. Pneumococcal vaccine: Completed series Tdap vaccine: Currently due, declined receiving. Shingles vaccine: Shingrix discussed. Please contact your pharmacy for coverage information.     Advanced directives: Advance directive discussed with you today. Even though you declined this today please call our office should you change your mind and we can give you the proper paperwork for you to fill out.  Conditions/risks identified: Recommend to exercise for 3 days a week for at least 30 minutes at a time.   Next appointment: 12/13/20 @ 10:20 AM with Dr Caryn Section   Preventive Care 66 Years and Older, Male Preventive care refers to lifestyle choices and visits with your health care provider that can promote health and wellness. What does preventive care include?  A yearly physical exam. This is also called an annual well check.  Dental exams once or twice a year.  Routine eye exams. Ask your health care provider how often you should have your eyes checked.  Personal lifestyle choices, including:  Daily care of your teeth and gums.  Regular physical activity.  Eating a healthy diet.  Avoiding tobacco and drug use.  Limiting alcohol use.  Practicing safe sex.  Taking low doses of aspirin every day.  Taking vitamin and mineral supplements as recommended by your health care provider. What happens  during an annual well check? The services and screenings done by your health care provider during your annual well check will depend on your age, overall health, lifestyle risk factors, and family history of disease. Counseling  Your health care provider may ask you questions about your:  Alcohol use.  Tobacco use.  Drug use.  Emotional well-being.  Home and relationship well-being.  Sexual activity.  Eating habits.  History of falls.  Memory and ability to understand (cognition).  Work and work Statistician. Screening  You may have the following tests or measurements:  Height, weight, and BMI.  Blood pressure.  Lipid and cholesterol levels. These may be checked every 5 years, or more frequently if you are over 59 years old.  Skin check.  Lung cancer screening. You may have this screening every year starting at age 64 if you have a 30-pack-year history of smoking and currently smoke or have quit within the past 15 years.  Fecal occult blood test (FOBT) of the stool. You may have this test every year starting at age 70.  Flexible sigmoidoscopy or colonoscopy. You may have a sigmoidoscopy every 5 years or a colonoscopy every 10 years starting at age 82.  Prostate cancer screening. Recommendations will vary depending on your family history and other risks.  Hepatitis C blood test.  Hepatitis B blood test.  Sexually transmitted disease (STD) testing.  Diabetes screening. This is done by checking your blood sugar (glucose) after you have not eaten for a while (fasting). You may have this done every 1-3 years.  Abdominal aortic aneurysm (AAA) screening. You may need this if you are a current or former smoker.  Osteoporosis. You may be screened  starting at age 39 if you are at high risk. Talk with your health care provider about your test results, treatment options, and if necessary, the need for more tests. Vaccines  Your health care provider may recommend certain  vaccines, such as:  Influenza vaccine. This is recommended every year.  Tetanus, diphtheria, and acellular pertussis (Tdap, Td) vaccine. You may need a Td booster every 10 years.  Zoster vaccine. You may need this after age 36.  Pneumococcal 13-valent conjugate (PCV13) vaccine. One dose is recommended after age 46.  Pneumococcal polysaccharide (PPSV23) vaccine. One dose is recommended after age 46. Talk to your health care provider about which screenings and vaccines you need and how often you need them. This information is not intended to replace advice given to you by your health care provider. Make sure you discuss any questions you have with your health care provider. Document Released: 09/30/2015 Document Revised: 05/23/2016 Document Reviewed: 07/05/2015 Elsevier Interactive Patient Education  2017 [place] Prevention in the Home Falls can cause injuries. They can happen to people of all ages. There are many things you can do to make your home safe and to help prevent falls. What can I do on the outside of my home?  Regularly fix the edges of walkways and driveways and fix any cracks.  Remove anything that might make you trip as you walk through a door, such as a raised step or threshold.  Trim any bushes or trees on the path to your home.  Use bright outdoor lighting.  Clear any walking paths of anything that might make someone trip, such as rocks or tools.  Regularly check to see if handrails are loose or broken. Make sure that both sides of any steps have handrails.  Any raised decks and porches should have guardrails on the edges.  Have any leaves, snow, or ice cleared regularly.  Use sand or salt on walking paths during winter.  Clean up any spills in your garage right away. This includes oil or grease spills. What can I do in the bathroom?  Use night lights.  Install grab bars by the toilet and in the tub and shower. Do not use towel bars as grab  bars.  Use non-skid mats or decals in the tub or shower.  If you need to sit down in the shower, use a plastic, non-slip stool.  Keep the floor dry. Clean up any water that spills on the floor as soon as it happens.  Remove soap buildup in the tub or shower regularly.  Attach bath mats securely with double-sided non-slip rug tape.  Do not have throw rugs and other things on the floor that can make you trip. What can I do in the bedroom?  Use night lights.  Make sure that you have a light by your bed that is easy to reach.  Do not use any sheets or blankets that are too big for your bed. They should not hang down onto the floor.  Have a firm chair that has side arms. You can use this for support while you get dressed.  Do not have throw rugs and other things on the floor that can make you trip. What can I do in the kitchen?  Clean up any spills right away.  Avoid walking on wet floors.  Keep items that you use a lot in easy-to-reach places.  If you need to reach something above you, use a strong step stool that has a grab  bar.  Keep electrical cords out of the way.  Do not use floor polish or wax that makes floors slippery. If you must use wax, use non-skid floor wax.  Do not have throw rugs and other things on the floor that can make you trip. What can I do with my stairs?  Do not leave any items on the stairs.  Make sure that there are handrails on both sides of the stairs and use them. Fix handrails that are broken or loose. Make sure that handrails are as long as the stairways.  Check any carpeting to make sure that it is firmly attached to the stairs. Fix any carpet that is loose or worn.  Avoid having throw rugs at the top or bottom of the stairs. If you do have throw rugs, attach them to the floor with carpet tape.  Make sure that you have a light switch at the top of the stairs and the bottom of the stairs. If you do not have them, ask someone to add them for  you. What else can I do to help prevent falls?  Wear shoes that:  Do not have high heels.  Have rubber bottoms.  Are comfortable and fit you well.  Are closed at the toe. Do not wear sandals.  If you use a stepladder:  Make sure that it is fully opened. Do not climb a closed stepladder.  Make sure that both sides of the stepladder are locked into place.  Ask someone to hold it for you, if possible.  Clearly mark and make sure that you can see:  Any grab bars or handrails.  First and last steps.  Where the edge of each step is.  Use tools that help you move around (mobility aids) if they are needed. These include:  Canes.  Walkers.  Scooters.  Crutches.  Turn on the lights when you go into a dark area. Replace any light bulbs as soon as they burn out.  Set up your furniture so you have a clear path. Avoid moving your furniture around.  If any of your floors are uneven, fix them.  If there are any pets around you, be aware of where they are.  Review your medicines with your doctor. Some medicines can make you feel dizzy. This can increase your chance of falling. Ask your doctor what other things that you can do to help prevent falls. This information is not intended to replace advice given to you by your health care provider. Make sure you discuss any questions you have with your health care provider. Document Released: 06/30/2009 Document Revised: 02/09/2016 Document Reviewed: 10/08/2014 Elsevier Interactive Patient Education  2017 [employer].

## 2020-10-19 DIAGNOSIS — G4733 Obstructive sleep apnea (adult) (pediatric): Secondary | ICD-10-CM | POA: Diagnosis not present

## 2020-10-24 DIAGNOSIS — R000 Tachycardia, unspecified: Secondary | ICD-10-CM | POA: Diagnosis not present

## 2020-10-24 DIAGNOSIS — I10 Essential (primary) hypertension: Secondary | ICD-10-CM | POA: Diagnosis not present

## 2020-10-24 DIAGNOSIS — E782 Mixed hyperlipidemia: Secondary | ICD-10-CM | POA: Diagnosis not present

## 2020-10-24 DIAGNOSIS — I428 Other cardiomyopathies: Secondary | ICD-10-CM | POA: Diagnosis not present

## 2020-10-24 DIAGNOSIS — I5022 Chronic systolic (congestive) heart failure: Secondary | ICD-10-CM | POA: Diagnosis not present

## 2020-10-24 DIAGNOSIS — I5020 Unspecified systolic (congestive) heart failure: Secondary | ICD-10-CM | POA: Diagnosis not present

## 2020-10-24 DIAGNOSIS — I2510 Atherosclerotic heart disease of native coronary artery without angina pectoris: Secondary | ICD-10-CM | POA: Diagnosis not present

## 2020-10-24 DIAGNOSIS — I472 Ventricular tachycardia: Secondary | ICD-10-CM | POA: Diagnosis not present

## 2020-10-24 DIAGNOSIS — R002 Palpitations: Secondary | ICD-10-CM | POA: Diagnosis not present

## 2020-10-24 DIAGNOSIS — R0609 Other forms of dyspnea: Secondary | ICD-10-CM | POA: Diagnosis not present

## 2020-12-13 ENCOUNTER — Other Ambulatory Visit: Payer: Self-pay

## 2020-12-13 ENCOUNTER — Encounter: Payer: Self-pay | Admitting: Family Medicine

## 2020-12-13 ENCOUNTER — Ambulatory Visit (INDEPENDENT_AMBULATORY_CARE_PROVIDER_SITE_OTHER): Payer: Medicare HMO | Admitting: Family Medicine

## 2020-12-13 VITALS — BP 121/78 | HR 66 | Temp 96.8°F | Resp 18 | Ht 70.0 in | Wt 248.0 lb

## 2020-12-13 DIAGNOSIS — E785 Hyperlipidemia, unspecified: Secondary | ICD-10-CM | POA: Diagnosis not present

## 2020-12-13 DIAGNOSIS — I5022 Chronic systolic (congestive) heart failure: Secondary | ICD-10-CM | POA: Diagnosis not present

## 2020-12-13 DIAGNOSIS — J4530 Mild persistent asthma, uncomplicated: Secondary | ICD-10-CM | POA: Diagnosis not present

## 2020-12-13 DIAGNOSIS — F32A Depression, unspecified: Secondary | ICD-10-CM

## 2020-12-13 DIAGNOSIS — F329 Major depressive disorder, single episode, unspecified: Secondary | ICD-10-CM

## 2020-12-13 DIAGNOSIS — J449 Chronic obstructive pulmonary disease, unspecified: Secondary | ICD-10-CM

## 2020-12-13 DIAGNOSIS — R69 Illness, unspecified: Secondary | ICD-10-CM | POA: Diagnosis not present

## 2020-12-13 DIAGNOSIS — R0600 Dyspnea, unspecified: Secondary | ICD-10-CM

## 2020-12-13 DIAGNOSIS — R0609 Other forms of dyspnea: Secondary | ICD-10-CM

## 2020-12-13 MED ORDER — SYMBICORT 160-4.5 MCG/ACT IN AERO: INHALATION_SPRAY | RESPIRATORY_TRACT | 12 refills | Status: DC

## 2020-12-13 MED ORDER — VENLAFAXINE HCL ER 75 MG PO CP24: 225.0000 mg | ORAL_CAPSULE | Freq: Every day | ORAL | 4 refills | Status: DC

## 2020-12-13 NOTE — Progress Notes (Signed)
Established patient visit   Patient: Dylan Lee   DOB: 06-04-1956   64 y.o. Male  MRN: [id number] Visit Date: 12/13/2020  Today's healthcare provider: Lelon Huh, MD   Chief Complaint  Patient presents with  . Hyperlipidemia  . Hypertension  . Depression   Subjective    HPI  Lipid/Cholesterol, Follow-up  Last lipid panel Other pertinent labs  Lab Results  Component Value Date   CHOL 239 (H) 09/12/2020   HDL 26 (L) 09/12/2020   LDLCALC 170 (H) 09/12/2020   TRIG 228 (H) 09/12/2020   CHOLHDL 9.2 (H) 09/12/2020   Lab Results  Component Value Date   ALT 10 09/12/2020   AST 14 09/12/2020   PLT 225 09/12/2020   TSH 2.580 01/14/2019     He was last seen for this 3 months ago.  Management since that visit includes increasing rosuvastatin to 40mg  once a day.  He reports good compliance with treatment. He is not having side effects.   Symptoms: Yes chest pain  Yes chest pressure/discomfort (startted 1 month ago)  Yes dyspnea (chronic worsening) Yes lower extremity edema  No numbness or tingling of extremity Yes orthopnea  No palpitations No paroxysmal nocturnal dyspnea  Yes speech difficulty Yes syncope (last occurred 3 days ago lasted for 30 seconds)    Current diet: well balanced Current exercise: none  The 10-year ASCVD risk score Mikey Bussing DC Jr., et al., 2013) is: 29.2%  ---------------------------------------------------------------------------------------------------  Hypertension, follow-up  BP Readings from Last 3 Encounters:  12/13/20 121/78  09/12/20 (!) 161/92  06/12/19 110/71   Wt Readings from Last 3 Encounters:  12/13/20 248 lb (112.5 kg)  09/12/20 245 lb (111.1 kg)  06/12/19 250 lb 3.2 oz (113.5 kg)     He was last seen for hypertension 3 months ago.  BP at that visit was 161/92. Management since that visit includes continuing same medications.  He reports good compliance with treatment. He is not having side effects.  He is  following a Regular diet. He is not exercising. He does smoke.  Use of agents associated with hypertension: NSAIDS.   Outside blood pressures are 130-140/ 78-90. Symptoms: Yes chest pain Yes chest pressure  No palpitations Yes syncope  Yes dyspnea Yes orthopnea  No paroxysmal nocturnal dyspnea Yes lower extremity edema   Pertinent labs: Lab Results  Component Value Date   CHOL 239 (H) 09/12/2020   HDL 26 (L) 09/12/2020   LDLCALC 170 (H) 09/12/2020   TRIG 228 (H) 09/12/2020   CHOLHDL 9.2 (H) 09/12/2020   Lab Results  Component Value Date   NA 141 09/12/2020   K 4.5 09/12/2020   CREATININE 1.50 (H) 09/12/2020   GFRNONAA 48 (L) 09/12/2020   GFRAA 56 (L) 09/12/2020   GLUCOSE 103 (H) 09/12/2020     The 10-year ASCVD risk score Mikey Bussing DC Jr., et al., 2013) is: 29.2%   ---------------------------------------------------------------------------------------------------  Depression, Follow-up  He  was last seen for this 3 months ago. Changes made at last visit include increasing Venlafaxine to 225mg  daily by adding additional 75mg  capsule to the 150mg .   He reports fair compliance with treatment. Patient had trouble getting the prescription dose filled at the pharmacy. He states he has still been taking 150mg  daily since the pharmacy would not dispense both the 150 and the 75mg  tablets.  He is not having side effects.   He reports good tolerance of treatment. Current symptoms include: depressed mood He feels he is Unchanged  since last visit.  Depression screen [facility] 2/9 12/13/2020 10/03/2020 09/12/2020  Decreased Interest 2 0 1  Down, Depressed, Hopeless 2 0 1  PHQ - 2 Score 4 0 2  Altered sleeping 2 - 2  Tired, decreased energy 2 - 2  Change in appetite 1 - 0  Feeling bad or failure about yourself  2 - 2  Trouble concentrating 1 - 1  Moving slowly or fidgety/restless 1 - 1  Suicidal thoughts 0 - 1  PHQ-9 Score 13 - 11  Difficult doing work/chores Very difficult - Very  difficult    ----------------------------------------------------------------------------------------- He also reports he has been having more coughing and shortness of breath the last couple of months. He states he is only using the Symbicort 2 puffs once a day. He also states Dr.Callwood recently had him increase Entresto dosage.      Medications: Outpatient Medications Prior to Visit  Medication Sig  . aspirin 81 MG tablet Take 81 mg by mouth daily.  . folic acid (FOLVITE) 1 MG tablet Take 1 mg by mouth daily.  . Methotrexate 25 MG/ML SOSY Inject 0.6 mLs into the skin once a week.  . metoprolol succinate (TOPROL-XL) 25 MG 24 hr tablet Take 2 tablets by mouth daily.   . rosuvastatin (CRESTOR) 40 MG tablet Take 1 tablet (40 mg total) by mouth daily.  . sacubitril-valsartan (ENTRESTO) 97-103 MG Take 1 tablet by mouth 2 (two) times daily.  Marland Kitchen spironolactone (ALDACTONE) 25 MG tablet Take 25 mg by mouth daily.  Marland Kitchen torsemide (DEMADEX) 20 MG tablet Take 10 mg by mouth daily.  . [DISCONTINUED] sacubitril-valsartan (ENTRESTO) 24-26 MG Take 1 tablet by mouth 2 (two) times a day.  . [DISCONTINUED] SYMBICORT 160-4.5 MCG/ACT inhaler INHALE 2 PUFFS INTO LUNGS TWICE DAILY  . [DISCONTINUED] venlafaxine XR (EFFEXOR-XR) 150 MG 24 hr capsule Take 1 capsule by mouth once daily  . [DISCONTINUED] venlafaxine XR (EFFEXOR XR) 75 MG 24 hr capsule Take 1 capsule (75 mg total) by mouth daily. Take in addition to a 150mg  capsule daily (Patient not taking: Reported on 12/13/2020)   No facility-administered medications prior to visit.    Review of Systems  Constitutional: Positive for fatigue. Negative for appetite change, chills and fever.  Respiratory: Positive for cough and shortness of breath. Negative for chest tightness and wheezing.   Cardiovascular: Positive for chest pain and leg swelling. Negative for palpitations.  Gastrointestinal: Negative for abdominal pain, nausea and vomiting.   Psychiatric/Behavioral: Positive for dysphoric mood.       Objective    BP 121/78 (BP Location: Left Arm, Patient Position: Sitting, Cuff Size: Large)   Pulse 66   Temp (!) 96.8 F (36 C) (Temporal)   Resp 18   Ht 5\' 10"  (1.778 m)   Wt 248 lb (112.5 kg)   BMI 35.58 kg/m     Physical Exam   General: Appearance:    Obese male in no acute distress  Eyes:    PERRL, conjunctiva/corneas clear, EOM's intact       Lungs:     Clear to auscultation bilaterally, respirations unlabored  Heart:    Normal heart rate. Normal rhythm. No murmurs, rubs, or gallops.   MS:   All extremities are intact.   Neurologic:   Awake, alert, oriented x 3. No apparent focal neurological           defect.         Assessment & Plan     1. Depressive disorder Has not  yet increase venlafaxine to 225.  New prescription sent for  venlafaxine XR (EFFEXOR-XR) 75 MG 24 hr capsule; Take 3 capsules (225 mg total) by mouth daily with breakfast.  Dispense: 90 capsule; Refill: 4   2. Dyslipidemia (high LDL; low HDL) Tolerating increased dose of rosuvastatin - Lipid panel - Comprehensive metabolic panel - CBC  3. Dyspnea on exertion Likely COPD and encouraged to increase Symbicort to 2 puffs BID as below.  Need to rule out cardiac etiology.  - CBC - Brain natriuretic peptide  4. Chronic systolic heart failure ([place]) Has recently increased Entresto to 97mg  per dr. Clayborn Bigness  5. Chronic obstructive pulmonary disease, unspecified COPD type ([place]) refill - SYMBICORT 160-4.5 MCG/ACT inhaler; INHALE 2 PUFFS INTO LUNGS TWICE DAILY  Dispense: 11 g; Refill: 12  6. Mild persistent asthma without complication         The entirety of the information documented in the History of Present Illness, Review of Systems and Physical Exam were personally obtained by me. Portions of this information were initially documented by the CMA and reviewed by me for thoroughness and accuracy.      Lelon Huh, MD  [facility] [phone] (phone) [phone] (fax)  [place]

## 2020-12-14 LAB — COMPREHENSIVE METABOLIC PANEL
ALT: 12 IU/L (ref 0–44)
AST: 14 IU/L (ref 0–40)
Albumin/Globulin Ratio: 1.8 (ref 1.2–2.2)
Albumin: 4.5 g/dL (ref 3.8–4.8)
Alkaline Phosphatase: 61 IU/L (ref 44–121)
BUN/Creatinine Ratio: 14 (ref 10–24)
BUN: 20 mg/dL (ref 8–27)
Bilirubin Total: 0.5 mg/dL (ref 0.0–1.2)
CO2: 26 mmol/L (ref 20–29)
Calcium: 9.3 mg/dL (ref 8.6–10.2)
Chloride: 101 mmol/L (ref 96–106)
Creatinine, Ser: 1.46 mg/dL — ABNORMAL HIGH (ref 0.76–1.27)
Globulin, Total: 2.5 g/dL (ref 1.5–4.5)
Glucose: 110 mg/dL — ABNORMAL HIGH (ref 65–99)
Potassium: 4.8 mmol/L (ref 3.5–5.2)
Sodium: 141 mmol/L (ref 134–144)
Total Protein: 7 g/dL (ref 6.0–8.5)
eGFR: 53 mL/min/{1.73_m2} — ABNORMAL LOW (ref >59)

## 2020-12-14 LAB — LIPID PANEL
Chol/HDL Ratio: 5.1 ratio — ABNORMAL HIGH (ref 0.0–5.0)
Cholesterol, Total: 149 mg/dL (ref 100–199)
HDL: 29 mg/dL — ABNORMAL LOW (ref >39)
LDL Chol Calc (NIH): 87 mg/dL (ref 0–99)
Triglycerides: 193 mg/dL — ABNORMAL HIGH (ref 0–149)
VLDL Cholesterol Cal: 33 mg/dL (ref 5–40)

## 2020-12-14 LAB — CBC
Hematocrit: 45.7 % (ref 37.5–51.0)
Hemoglobin: 15.6 g/dL (ref 13.0–17.7)
MCH: 34.1 pg — ABNORMAL HIGH (ref 26.6–33.0)
MCHC: 34.1 g/dL (ref 31.5–35.7)
MCV: 100 fL — ABNORMAL HIGH (ref 79–97)
Platelets: 231 10*3/uL (ref 150–450)
RBC: 4.58 x10E6/uL (ref 4.14–5.80)
RDW: 16.4 % — ABNORMAL HIGH (ref 11.6–15.4)
WBC: 9.8 10*3/uL (ref 3.4–10.8)

## 2020-12-14 LAB — BRAIN NATRIURETIC PEPTIDE: BNP: 37.8 pg/mL (ref 0.0–100.0)

## 2020-12-19 ENCOUNTER — Telehealth: Payer: Self-pay

## 2020-12-19 NOTE — Telephone Encounter (Signed)
Have you seen a PA on this patient?

## 2020-12-19 NOTE — Telephone Encounter (Signed)
Copied from [place] [id number]. Topic: General - Other >> Dec 16, 2020  9:16 AM Tessa Lerner A wrote: Reason for CRM: Patient's wife would like to be contacted by a member of staff to go over patient's labs from 12/13/20  Please contact to further advise when possible

## 2020-12-19 NOTE — Telephone Encounter (Signed)
I called patient's wife and she verbalized understanding of information from lab results. I scheduled patient for 3 mo f/u.

## 2020-12-19 NOTE — Telephone Encounter (Signed)
Copied from [place] [phone]. Topic: General - Other >> Dec 19, 2020 11:47 AM Pawlus, Brayton Layman A wrote: Reason for CRM: Pt stated the pharmacy faxed over a PA for venlafaxine XR (EFFEXOR-XR) 75 MG 24 hr, Pt wanted to know the status of his refill and if the fax has been sent back yet. Please advise.

## 2020-12-20 NOTE — Telephone Encounter (Signed)
No I haven't seen a PA for the venlafaxine.

## 2020-12-22 ENCOUNTER — Other Ambulatory Visit: Payer: Self-pay | Admitting: Family Medicine

## 2020-12-22 DIAGNOSIS — L4050 Arthropathic psoriasis, unspecified: Secondary | ICD-10-CM

## 2020-12-22 NOTE — Telephone Encounter (Signed)
Requested medication (s) are due for refill today: yes  Requested medication (s) are on the active medication list: yes  Last refill: 09/13/20  67ml  0 refills  Future visit scheduled yes 04/03/21  Notes to clinic: not delegated  Requested Prescriptions  Pending Prescriptions Disp Refills   methotrexate 50 MG/2ML injection [Pharmacy Med Name: Methotrexate Sodium 50 MG/2ML Injection Solution] 3 mL 0    Sig: INJECT 0.6 ML INTO THE SKIN ONCE A WEEK      Not Delegated - Immunology: Immunosuppressive Agents - methotrexate Failed - 12/22/2020  4:12 PM      Failed - This refill cannot be delegated      Failed - Cr in normal range and within 90 days    Creatinine, Ser  Date Value Ref Range Status  12/13/2020 1.46 (H) 0.76 - 1.27 mg/dL Final          Passed - ALT in normal range and within 90 days    ALT  Date Value Ref Range Status  12/13/2020 12 0 - 44 IU/L Final          Passed - AST in normal range and within 90 days    AST  Date Value Ref Range Status  12/13/2020 14 0 - 40 IU/L Final          Passed - HCT in normal range and within 90 days    Hematocrit  Date Value Ref Range Status  12/13/2020 45.7 37.5 - 51.0 % Final          Passed - HGB in normal range and within 90 days    Hemoglobin  Date Value Ref Range Status  12/13/2020 15.6 13.0 - 17.7 g/dL Final          Passed - PLT in normal range and within 90 days    Platelets  Date Value Ref Range Status  12/13/2020 231 150 - 450 x10E3/uL Final          Passed - RBC in normal range and within 90 days    RBC  Date Value Ref Range Status  12/13/2020 4.58 4.14 - 5.80 x10E6/uL Final  03/07/2020 4.96 3.87 - 5.11 Final          Passed - WBC in normal range and within 90 days    WBC  Date Value Ref Range Status  12/13/2020 9.8 3.4 - 10.8 x10E3/uL Final  05/25/2015 4.4 3.8 - 10.6 K/uL Final          Passed - Valid encounter within last 3 months    Recent Outpatient Visits           1 week ago Depressive  disorder   [facility] Birdie Sons, MD   3 months ago [place], Donald E, MD   1 year ago Pre-diabetes   [facility] Birdie Sons, MD   1 year ago Constipation, unspecified constipation type   [facility] Birdie Sons, MD   2 years ago Encounter for initial preventive physical examination covered by [facility] Birdie Sons, MD       Future Appointments             In 3 months Fisher, Kirstie Peri, MD [facility]

## 2021-01-24 DIAGNOSIS — I5022 Chronic systolic (congestive) heart failure: Secondary | ICD-10-CM | POA: Diagnosis not present

## 2021-02-14 ENCOUNTER — Telehealth: Payer: Self-pay | Admitting: Family Medicine

## 2021-02-14 NOTE — Telephone Encounter (Signed)
Patient advised. Appointment scheduled for Friday 02/17/2021 at 11:20am. Patient advised to go to urgent care is symptoms worsen before scheduled appointment. Patient verbalized understanding.

## 2021-02-14 NOTE — Telephone Encounter (Signed)
You can unblock my 11:20 acute visit slot Friday. If he can't wait that long then he needs to go to Urgent Care.

## 2021-02-14 NOTE — Telephone Encounter (Signed)
Pt wife Carolynn Serve is calling to schedule an appt for a rash and pain in his groan area. Soonest appt is with Simona Huh mid June. Pt has already taken yeast creme that is OTC. Not helping much. Please advise Cb- [phone]

## 2021-02-17 ENCOUNTER — Other Ambulatory Visit: Payer: Self-pay

## 2021-02-17 ENCOUNTER — Encounter: Payer: Self-pay | Admitting: Family Medicine

## 2021-02-17 ENCOUNTER — Ambulatory Visit (INDEPENDENT_AMBULATORY_CARE_PROVIDER_SITE_OTHER): Payer: Medicare HMO | Admitting: Family Medicine

## 2021-02-17 VITALS — BP 104/72 | HR 75 | Wt 237.0 lb

## 2021-02-17 DIAGNOSIS — F329 Major depressive disorder, single episode, unspecified: Secondary | ICD-10-CM

## 2021-02-17 DIAGNOSIS — L03818 Cellulitis of other sites: Secondary | ICD-10-CM | POA: Diagnosis not present

## 2021-02-17 DIAGNOSIS — L304 Erythema intertrigo: Secondary | ICD-10-CM

## 2021-02-17 DIAGNOSIS — F32A Depression, unspecified: Secondary | ICD-10-CM

## 2021-02-17 DIAGNOSIS — R69 Illness, unspecified: Secondary | ICD-10-CM | POA: Diagnosis not present

## 2021-02-17 MED ORDER — VENLAFAXINE HCL ER 150 MG PO CP24: 150.0000 mg | ORAL_CAPSULE | Freq: Every day | ORAL | 1 refills | Status: DC

## 2021-02-17 MED ORDER — CEFDINIR 300 MG PO CAPS: 600.0000 mg | ORAL_CAPSULE | Freq: Every day | ORAL | 0 refills | Status: AC

## 2021-02-17 MED ORDER — KETOCONAZOLE 2 % EX CREA: 1.0000 "application " | TOPICAL_CREAM | Freq: Two times a day (BID) | CUTANEOUS | 1 refills | Status: DC

## 2021-02-17 MED ORDER — TRIAMCINOLONE ACETONIDE 0.1 % EX CREA: 1.0000 "application " | TOPICAL_CREAM | Freq: Two times a day (BID) | CUTANEOUS | 0 refills | Status: DC

## 2021-02-17 MED ORDER — VENLAFAXINE HCL ER 75 MG PO CP24: 75.0000 mg | ORAL_CAPSULE | Freq: Every day | ORAL | Status: DC

## 2021-02-17 NOTE — Progress Notes (Signed)
Established patient visit   Patient: Dylan Lee   DOB: Jul 09, 1956   65 y.o. Male  MRN: [id number] Visit Date: 02/17/2021  Today's healthcare provider: Lelon Huh, MD   Chief Complaint  Patient presents with  . Rash   Subjective    Rash This is a new problem. The current episode started in the past 7 days. The problem has been waxing and waning since onset. The affected locations include the groin. The rash is characterized by pain, itchiness, redness and swelling. He was exposed to nothing. Associated symptoms include shortness of breath (Chronic issue). Pertinent negatives include no congestion, cough, fatigue or fever. Treatments tried: Monistat. The treatment provided mild relief.    Pt would like to discuss dropping his venlafaxine to 150mg  daily.  Pt states his insurance will not cover 23 x 75mg  tablets daily.     Medications: Outpatient Medications Prior to Visit  Medication Sig  . aspirin 81 MG tablet Take 81 mg by mouth daily.  . folic acid (FOLVITE) 1 MG tablet Take 1 mg by mouth daily.  . methotrexate 50 MG/2ML injection INJECT 0.6 ML INTO THE SKIN ONCE A WEEK  . metoprolol succinate (TOPROL-XL) 25 MG 24 hr tablet Take 2 tablets by mouth daily.   . rosuvastatin (CRESTOR) 40 MG tablet Take 1 tablet (40 mg total) by mouth daily.  . sacubitril-valsartan (ENTRESTO) 97-103 MG Take 1 tablet by mouth 2 (two) times daily.  Marland Kitchen spironolactone (ALDACTONE) 25 MG tablet Take 25 mg by mouth daily.  . SYMBICORT 160-4.5 MCG/ACT inhaler INHALE 2 PUFFS INTO LUNGS TWICE DAILY  . torsemide (DEMADEX) 20 MG tablet Take 10 mg by mouth daily.  Marland Kitchen venlafaxine XR (EFFEXOR-XR) 75 MG 24 hr capsule Take 3 capsules (225 mg total) by mouth daily with breakfast.   No facility-administered medications prior to visit.    Review of Systems  Constitutional: Negative for fatigue and fever.  HENT: Negative for congestion.   Respiratory: Positive for shortness of breath (Chronic issue).  Negative for cough.   Cardiovascular: Negative.   Skin: Positive for rash.  Neurological: Positive for headaches. Negative for dizziness and light-headedness.       Objective    BP 104/72 (BP Location: Right Arm, Patient Position: Sitting, Cuff Size: Large)   Pulse 75   Wt 237 lb (107.5 kg)   BMI 34.01 kg/m    Physical Exam  Moderately inflamed bilateral inguinal area c/w intertrigo with secondary cellulitis.    Assessment & Plan     1. Intertrigo  - ketoconazole (NIZORAL) 2 % cream; Apply 1 application topically 2 (two) times daily.  Dispense: 60 g; Refill: 1 - triamcinolone cream (KENALOG) 0.1 %; Apply 1 application topically 2 (two) times daily.  Dispense: 30 g; Refill: 0  2. Cellulitis of other specified site  - cefdinir (OMNICEF) 300 MG capsule; Take 2 capsules (600 mg total) by mouth daily for 10 days.  Dispense: 20 capsule; Refill: 0  Call if not greatly improving next week.   3. Depressive disorder Insurance not covering 3 x 75mg  tablet a day. Change to  - venlafaxine XR (EFFEXOR-XR) 150 MG 24 hr capsule; Take 1 capsule (150 mg total) by mouth daily with breakfast. Take in addition to 75mg  tablet every day  Dispense: 90 capsule; Refill: 1 - venlafaxine XR (EFFEXOR XR) 75 MG 24 hr capsule; Take 1 capsule (75 mg total) by mouth daily with breakfast. Take in addition to 150mg  tablet daily.  The entirety of the information documented in the History of Present Illness, Review of Systems and Physical Exam were personally obtained by me. Portions of this information were initially documented by the CMA and reviewed by me for thoroughness and accuracy.      Lelon Huh, MD  [facility] [phone] (phone) [phone] (fax)  [place]

## 2021-03-21 ENCOUNTER — Ambulatory Visit: Payer: Self-pay | Admitting: *Deleted

## 2021-03-21 NOTE — Telephone Encounter (Signed)
LMTCB 03/21/2021.   PEC please advise pt when he calls back.   Thanks,   -Mickel Baas

## 2021-03-21 NOTE — Telephone Encounter (Signed)
Pt. Started having decreased urinary stream and pain with urination 1 week ago. Does have a history of kidney stones. Feels like he is not emptying bladder well. Had flank pain last week, but that has resolved. No availability. Will send triage for review per [facility] request.Please advise pt.

## 2021-03-21 NOTE — Telephone Encounter (Signed)
Patient wife called in to say that he is having some problems with urinating going on about a week say that he feel like he have something  stuck in his Urether tube. Please call Ph# [phone]  Reason for Disposition  Urinating more frequently than usual (i.e., frequency)  Answer Assessment - Initial Assessment Questions 1. SYMPTOM: "What's the main symptom you're concerned about?" (e.g., frequency, incontinence)     Not emptying bladder, pain in penis 2. ONSET: "When did the symptoms start?"     I week ago 3. PAIN: "Is there any pain?" If Yes, ask: "How bad is it?" (Scale: 1-10; mild, moderate, severe)     2 4. CAUSE: "What do you think is causing the symptoms?"     Unsure 5. OTHER SYMPTOMS: "Do you have any other symptoms?" (e.g., fever, flank pain, blood in urine, pain with urination)     No 6. PREGNANCY: "Is there any chance you are pregnant?" "When was your last menstrual period?"     N/a  Protocols used: Urinary Symptoms-A-AH

## 2021-03-21 NOTE — Telephone Encounter (Signed)
He needs to go to urgent care for evaluation.

## 2021-03-21 NOTE — Telephone Encounter (Signed)
Patient returned call and advised of the message from Dr. Caryn Section below to go to [facility], patient verbalized understanding.

## 2021-04-03 ENCOUNTER — Ambulatory Visit
Admission: RE | Admit: 2021-04-03 | Discharge: 2021-04-03 | Disposition: A | Discharge status: Home / Self Care | Payer: Medicare HMO | Attending: Family Medicine | Admitting: Family Medicine

## 2021-04-03 ENCOUNTER — Encounter: Payer: Self-pay | Admitting: Family Medicine

## 2021-04-03 ENCOUNTER — Ambulatory Visit
Admission: RE | Admit: 2021-04-03 | Discharge: 2021-04-03 | Disposition: A | Discharge status: Home / Self Care | Payer: Medicare HMO | Source: Ambulatory Visit | Attending: Family Medicine | Admitting: Family Medicine

## 2021-04-03 ENCOUNTER — Ambulatory Visit (INDEPENDENT_AMBULATORY_CARE_PROVIDER_SITE_OTHER): Payer: Medicare HMO | Admitting: Family Medicine

## 2021-04-03 ENCOUNTER — Other Ambulatory Visit: Payer: Self-pay

## 2021-04-03 VITALS — BP 122/85 | HR 76 | Resp 16 | Ht 70.0 in | Wt 241.0 lb

## 2021-04-03 DIAGNOSIS — R109 Unspecified abdominal pain: Secondary | ICD-10-CM

## 2021-04-03 DIAGNOSIS — J449 Chronic obstructive pulmonary disease, unspecified: Secondary | ICD-10-CM

## 2021-04-03 DIAGNOSIS — R339 Retention of urine, unspecified: Secondary | ICD-10-CM | POA: Diagnosis not present

## 2021-04-03 DIAGNOSIS — F329 Major depressive disorder, single episode, unspecified: Secondary | ICD-10-CM | POA: Diagnosis not present

## 2021-04-03 DIAGNOSIS — R69 Illness, unspecified: Secondary | ICD-10-CM | POA: Diagnosis not present

## 2021-04-03 DIAGNOSIS — F32A Depression, unspecified: Secondary | ICD-10-CM

## 2021-04-03 LAB — POCT URINALYSIS DIPSTICK
Bilirubin, UA: NEGATIVE
Glucose, UA: NEGATIVE
Ketones, UA: NEGATIVE
Leukocytes, UA: NEGATIVE
Nitrite, UA: NEGATIVE
Protein, UA: NEGATIVE
Spec Grav, UA: 1.02 (ref 1.010–1.025)
Urobilinogen, UA: 0.2 E.U./dL
pH, UA: 6.5 (ref 5.0–8.0)

## 2021-04-03 MED ORDER — VENLAFAXINE HCL ER 75 MG PO CP24: 75.0000 mg | ORAL_CAPSULE | Freq: Every day | ORAL | 3 refills | Status: DC

## 2021-04-03 NOTE — Progress Notes (Signed)
Established patient visit   Patient: Dylan Lee   DOB: 1956/02/28   65 y.o. Male  MRN: [id number] Visit Date: 04/03/2021  Today's healthcare provider: Lelon Huh, MD   Chief Complaint  Patient presents with   Depression   Urinary Retention   Subjective    HPI  Depression, Follow-up  He  was last seen for this 3 months ago. Changes made at last visit include changing .   He reports good compliance with treatment. He is not having side effects.   He reports good tolerance of treatment.  He feels he is Unchanged since last visit.  Depression screen [facility] 2/9 04/03/2021 02/17/2021 12/13/2020  Decreased Interest 1 1 2   Down, Depressed, Hopeless 1 1 2   PHQ - 2 Score 2 2 4   Altered sleeping 1 1 2   Tired, decreased energy 1 0 2  Change in appetite 0 2 1  Feeling bad or failure about yourself  1 1 2   Trouble concentrating 0 0 1  Moving slowly or fidgety/restless 1 0 1  Suicidal thoughts 0 0 0  PHQ-9 Score 6 6 13   Difficult doing work/chores Not difficult at all Not difficult at all Very difficult    Urinary retention Patient feels that he may have a kidney stone. He reports that he has urinary retention when trying to urinate. He denies hematuria. He does have lower back pain.       Medications: Outpatient Medications Prior to Visit  Medication Sig   aspirin 81 MG tablet Take 81 mg by mouth daily.   ketoconazole (NIZORAL) 2 % cream Apply 1 application topically 2 (two) times daily.   methotrexate 50 MG/2ML injection INJECT 0.6 ML INTO THE SKIN ONCE A WEEK   metoprolol succinate (TOPROL-XL) 25 MG 24 hr tablet Take 2 tablets by mouth daily.    rosuvastatin (CRESTOR) 40 MG tablet Take 1 tablet (40 mg total) by mouth daily.   sacubitril-valsartan (ENTRESTO) 97-103 MG Take 1 tablet by mouth 2 (two) times daily.   SYMBICORT 160-4.5 MCG/ACT inhaler INHALE 2 PUFFS INTO LUNGS TWICE DAILY   triamcinolone cream (KENALOG) 0.1 % Apply 1 application topically 2 (two)  times daily.   venlafaxine XR (EFFEXOR XR) 75 MG 24 hr capsule Take 1 capsule (75 mg total) by mouth daily with breakfast. Take in addition to 150mg  tablet daily.   venlafaxine XR (EFFEXOR-XR) 150 MG 24 hr capsule Take 1 capsule (150 mg total) by mouth daily with breakfast. Take in addition to 75mg  tablet every day   folic acid (FOLVITE) 1 MG tablet Take 1 mg by mouth daily. (Patient not taking: No sig reported)   spironolactone (ALDACTONE) 25 MG tablet Take 25 mg by mouth daily. (Patient not taking: No sig reported)   torsemide (DEMADEX) 20 MG tablet Take 10 mg by mouth daily. (Patient not taking: No sig reported)   No facility-administered medications prior to visit.    Review of Systems  Constitutional:  Negative for activity change and fatigue.  Respiratory:  Negative for cough and shortness of breath.   Cardiovascular:  Negative for chest pain, palpitations and leg swelling.  Genitourinary:  Positive for decreased urine volume, difficulty urinating and flank pain. Negative for hematuria, penile discharge, penile pain, penile swelling, scrotal swelling and testicular pain.  Neurological:  Negative for dizziness, light-headedness and headaches.  Psychiatric/Behavioral:  Negative for self-injury, sleep disturbance and suicidal ideas. The patient is not nervous/anxious.       Objective  BP 122/85   Pulse 76   Resp 16   Ht 5\' 10"  (1.778 m)   Wt 241 lb (109.3 kg)   BMI 34.58 kg/m     Physical Exam   General: Appearance:    Obese male in no acute distress  Eyes:    PERRL, conjunctiva/corneas clear, EOM's intact       Lungs:     Clear to auscultation bilaterally, respirations unlabored  Heart:    Normal heart rate. Normal rhythm. No murmurs, rubs, or gallops.    MS:   All extremities are intact.    Neurologic:   Awake, alert, oriented x 3. No apparent focal neurological defect.        Results for orders placed or performed in visit on 04/03/21  POCT urinalysis dipstick   Result Value Ref Range   Color, UA yellow    Clarity, UA clear    Glucose, UA Negative Negative   Bilirubin, UA negative    Ketones, UA negative    Spec Grav, UA 1.020 1.010 - 1.025   Blood, UA hemolyzed small    pH, UA 6.5 5.0 - 8.0   Protein, UA Negative Negative   Urobilinogen, UA 0.2 0.2 or 1.0 E.U./dL   Nitrite, UA negative    Leukocytes, UA Negative Negative     Assessment & Plan     1. Depressive disorder refill- venlafaxine XR (EFFEXOR XR) 75 MG 24 hr capsule; Take 1 capsule (75 mg total) by mouth daily with breakfast. Take in addition to 150mg  tablet daily.  Dispense: 90 capsule; Refill: 3  2. Urinary retention   3. Flank pain  - DG Abd 1 View; Future History of kidney stones which are currently suspected.   4. Chronic obstructive pulmonary disease, unspecified COPD type ([place]) Doing well with Symbicort. Continue current medications.          The entirety of the information documented in the History of Present Illness, Review of Systems and Physical Exam were personally obtained by me. Portions of this information were initially documented by the CMA and reviewed by me for thoroughness and accuracy.     Lelon Huh, MD  [facility] [phone] (phone) [phone] (fax)  [place]

## 2021-04-03 NOTE — Patient Instructions (Signed)
Please review the attached list of medications and notify my office if there are any errors.   Go to the [facility] on [facility] for kidney, ureter, bladder Xray

## 2021-04-05 ENCOUNTER — Telehealth: Payer: Self-pay

## 2021-04-05 MED ORDER — TAMSULOSIN HCL 0.4 MG PO CAPS: 0.4000 mg | ORAL_CAPSULE | Freq: Every day | ORAL | 1 refills | Status: DC

## 2021-04-05 NOTE — Telephone Encounter (Signed)
-----   Message from Birdie Sons, MD sent at 04/05/2021 12:39 PM EDT ----- Dylan Lee are normal. If he is still having trouble urinating then can try tamsulosin 0.4mg  once a day, #30, rf x 1. If that doesn't help then he will need to see urologist.

## 2021-04-05 NOTE — Telephone Encounter (Signed)
Patient advised and verbalized understanding. He would like to try Tamsulosin. Prescription sent into pharmacy.

## 2021-04-25 DIAGNOSIS — I5022 Chronic systolic (congestive) heart failure: Secondary | ICD-10-CM | POA: Diagnosis not present

## 2021-05-15 ENCOUNTER — Ambulatory Visit (INDEPENDENT_AMBULATORY_CARE_PROVIDER_SITE_OTHER): Payer: Medicare HMO | Admitting: Family Medicine

## 2021-05-15 ENCOUNTER — Other Ambulatory Visit: Payer: Self-pay

## 2021-05-15 ENCOUNTER — Encounter: Payer: Self-pay | Admitting: Family Medicine

## 2021-05-15 VITALS — BP 136/78 | HR 80 | Temp 97.4°F | Resp 18 | Wt 238.0 lb

## 2021-05-15 DIAGNOSIS — H1132 Conjunctival hemorrhage, left eye: Secondary | ICD-10-CM | POA: Diagnosis not present

## 2021-05-15 DIAGNOSIS — S300XXA Contusion of lower back and pelvis, initial encounter: Secondary | ICD-10-CM

## 2021-05-15 NOTE — Progress Notes (Signed)
Established patient visit   Patient: Dylan Lee   DOB: 09/25/1955   65 y.o. Male  MRN: [id number] Visit Date: 05/15/2021  Today's healthcare provider: Lelon Huh, MD   Chief Complaint  Patient presents with   Back Pain    Subjective  -------------------------------------------------------------------------------------------------------------------- Back Pain This is a new problem. Episode onset: 1 week ago. The problem has been gradually improving since onset. The pain is present in the right gluteal area. The quality of the pain is described as aching. Exacerbated by: walking. Pertinent negatives include no abdominal pain, chest pain or fever. Risk factors include recent trauma (had a fall last week. Patient states he had a coughing spell that caused him to pass out and fall on to his back). He has tried nothing for the symptoms.   He has also noticed yellowing of his left eye for the last few days. There is no pain, burning, or change of vision in the affected eye.     Medications: Outpatient Medications Prior to Visit  Medication Sig   aspirin 81 MG tablet Take 81 mg by mouth daily.   folic acid (FOLVITE) 1 MG tablet Take 1 mg by mouth daily.   ketoconazole (NIZORAL) 2 % cream Apply 1 application topically 2 (two) times daily.   methotrexate 50 MG/2ML injection INJECT 0.6 ML INTO THE SKIN ONCE A WEEK   metoprolol succinate (TOPROL-XL) 25 MG 24 hr tablet Take 2 tablets by mouth daily.    rosuvastatin (CRESTOR) 40 MG tablet Take 1 tablet (40 mg total) by mouth daily.   sacubitril-valsartan (ENTRESTO) 97-103 MG Take 1 tablet by mouth 2 (two) times daily.   spironolactone (ALDACTONE) 25 MG tablet Take 25 mg by mouth daily.   SYMBICORT 160-4.5 MCG/ACT inhaler INHALE 2 PUFFS INTO LUNGS TWICE DAILY   tamsulosin (FLOMAX) 0.4 MG CAPS capsule Take 1 capsule (0.4 mg total) by mouth daily.   torsemide (DEMADEX) 20 MG tablet Take 10 mg by mouth daily.   triamcinolone cream  (KENALOG) 0.1 % Apply 1 application topically 2 (two) times daily.   venlafaxine XR (EFFEXOR XR) 75 MG 24 hr capsule Take 1 capsule (75 mg total) by mouth daily with breakfast. Take in addition to '150mg'$  tablet daily.   venlafaxine XR (EFFEXOR-XR) 150 MG 24 hr capsule Take 1 capsule (150 mg total) by mouth daily with breakfast. Take in addition to '75mg'$  tablet every day   No facility-administered medications prior to visit.    Review of Systems  Constitutional:  Negative for appetite change, chills and fever.  Eyes:  Positive for redness.  Respiratory:  Positive for shortness of breath. Negative for chest tightness and wheezing.   Cardiovascular:  Negative for chest pain and palpitations.  Gastrointestinal:  Negative for abdominal pain, nausea and vomiting.  Musculoskeletal:  Positive for back pain.  Neurological:  Positive for syncope.      Objective  -------------------------------------------------------------------------------------------------------------------- BP 136/78 (BP Location: Left Arm, Patient Position: Sitting, Cuff Size: Large)   Pulse 80   Temp (!) 97.4 F (36.3 C) (Temporal)   Resp 18   Wt 238 lb (108 kg)   SpO2 96% Comment: room air  BMI 34.15 kg/m  Physical Exam   About 3 mm subconjunctival hemorrhage behind left lower eyelid with slight yellowing of adjacent sclera.  Tender right lower buttocks. No difficulty bearing weight or walking.     Assessment & Plan  ---------------------------------------------------------------------------------------------------------------------- 1. Contusion of buttock, initial encounter Healing appropriately. Can apply heat pad  a few times daily until healed.   2. Subconjunctival hemorrhage of left eye Likely occurred during coughing spell preceding fall.  Counseled on typical course and that is likely responsible for yellowing of adjacent sclera.      The entirety of the information documented in the History of Present  Illness, Review of Systems and Physical Exam were personally obtained by me. Portions of this information were initially documented by the CMA and reviewed by me for thoroughness and accuracy.     Lelon Huh, MD  [facility] [phone] (phone) [phone] (fax)  [place]

## 2021-05-17 DIAGNOSIS — R053 Chronic cough: Secondary | ICD-10-CM | POA: Diagnosis not present

## 2021-05-17 DIAGNOSIS — G4733 Obstructive sleep apnea (adult) (pediatric): Secondary | ICD-10-CM | POA: Diagnosis not present

## 2021-05-17 DIAGNOSIS — J449 Chronic obstructive pulmonary disease, unspecified: Secondary | ICD-10-CM | POA: Diagnosis not present

## 2021-05-17 DIAGNOSIS — R0609 Other forms of dyspnea: Secondary | ICD-10-CM | POA: Diagnosis not present

## 2021-05-18 DIAGNOSIS — J449 Chronic obstructive pulmonary disease, unspecified: Secondary | ICD-10-CM | POA: Diagnosis not present

## 2021-05-28 ENCOUNTER — Other Ambulatory Visit: Payer: Self-pay | Admitting: Family Medicine

## 2021-05-28 DIAGNOSIS — L4050 Arthropathic psoriasis, unspecified: Secondary | ICD-10-CM

## 2021-08-08 DIAGNOSIS — R0609 Other forms of dyspnea: Secondary | ICD-10-CM | POA: Diagnosis not present

## 2021-08-08 DIAGNOSIS — I428 Other cardiomyopathies: Secondary | ICD-10-CM | POA: Diagnosis not present

## 2021-08-08 DIAGNOSIS — I714 Abdominal aortic aneurysm, without rupture, unspecified: Secondary | ICD-10-CM | POA: Diagnosis not present

## 2021-08-08 DIAGNOSIS — I5020 Unspecified systolic (congestive) heart failure: Secondary | ICD-10-CM | POA: Diagnosis not present

## 2021-08-08 DIAGNOSIS — I10 Essential (primary) hypertension: Secondary | ICD-10-CM | POA: Diagnosis not present

## 2021-08-08 DIAGNOSIS — I739 Peripheral vascular disease, unspecified: Secondary | ICD-10-CM | POA: Diagnosis not present

## 2021-08-08 DIAGNOSIS — I5022 Chronic systolic (congestive) heart failure: Secondary | ICD-10-CM | POA: Diagnosis not present

## 2021-08-08 DIAGNOSIS — I4729 Other ventricular tachycardia: Secondary | ICD-10-CM | POA: Diagnosis not present

## 2021-08-08 DIAGNOSIS — R002 Palpitations: Secondary | ICD-10-CM | POA: Diagnosis not present

## 2021-08-08 DIAGNOSIS — R000 Tachycardia, unspecified: Secondary | ICD-10-CM | POA: Diagnosis not present

## 2021-08-17 ENCOUNTER — Other Ambulatory Visit: Payer: Self-pay | Admitting: Internal Medicine

## 2021-08-17 DIAGNOSIS — I739 Peripheral vascular disease, unspecified: Secondary | ICD-10-CM

## 2021-08-17 DIAGNOSIS — I714 Abdominal aortic aneurysm, without rupture, unspecified: Secondary | ICD-10-CM

## 2021-09-07 ENCOUNTER — Encounter: Payer: Self-pay | Admitting: Emergency Medicine

## 2021-09-07 ENCOUNTER — Emergency Department: Payer: Medicare HMO

## 2021-09-07 ENCOUNTER — Other Ambulatory Visit: Payer: Self-pay

## 2021-09-07 ENCOUNTER — Inpatient Hospital Stay
Admission: EM | Admit: 2021-09-07 | Discharge: 2021-09-13 | DRG: 177 | Disposition: A | Discharge status: Home Health | Payer: Medicare HMO | Attending: Internal Medicine | Admitting: Internal Medicine

## 2021-09-07 DIAGNOSIS — Z8619 Personal history of other infectious and parasitic diseases: Secondary | ICD-10-CM

## 2021-09-07 DIAGNOSIS — J1282 Pneumonia due to coronavirus disease 2019: Secondary | ICD-10-CM | POA: Diagnosis present

## 2021-09-07 DIAGNOSIS — Z23 Encounter for immunization: Secondary | ICD-10-CM

## 2021-09-07 DIAGNOSIS — Z87442 Personal history of urinary calculi: Secondary | ICD-10-CM | POA: Diagnosis not present

## 2021-09-07 DIAGNOSIS — Z818 Family history of other mental and behavioral disorders: Secondary | ICD-10-CM

## 2021-09-07 DIAGNOSIS — Z8249 Family history of ischemic heart disease and other diseases of the circulatory system: Secondary | ICD-10-CM | POA: Diagnosis not present

## 2021-09-07 DIAGNOSIS — Z9989 Dependence on other enabling machines and devices: Secondary | ICD-10-CM | POA: Diagnosis not present

## 2021-09-07 DIAGNOSIS — R1110 Vomiting, unspecified: Secondary | ICD-10-CM | POA: Diagnosis not present

## 2021-09-07 DIAGNOSIS — J9601 Acute respiratory failure with hypoxia: Secondary | ICD-10-CM | POA: Diagnosis not present

## 2021-09-07 DIAGNOSIS — G4730 Sleep apnea, unspecified: Secondary | ICD-10-CM

## 2021-09-07 DIAGNOSIS — N1831 Chronic kidney disease, stage 3a: Secondary | ICD-10-CM | POA: Diagnosis not present

## 2021-09-07 DIAGNOSIS — F32A Depression, unspecified: Secondary | ICD-10-CM | POA: Diagnosis not present

## 2021-09-07 DIAGNOSIS — Z95810 Presence of automatic (implantable) cardiac defibrillator: Secondary | ICD-10-CM | POA: Diagnosis not present

## 2021-09-07 DIAGNOSIS — F17290 Nicotine dependence, other tobacco product, uncomplicated: Secondary | ICD-10-CM | POA: Diagnosis not present

## 2021-09-07 DIAGNOSIS — Z811 Family history of alcohol abuse and dependence: Secondary | ICD-10-CM

## 2021-09-07 DIAGNOSIS — R059 Cough, unspecified: Secondary | ICD-10-CM | POA: Diagnosis not present

## 2021-09-07 DIAGNOSIS — I130 Hypertensive heart and chronic kidney disease with heart failure and stage 1 through stage 4 chronic kidney disease, or unspecified chronic kidney disease: Secondary | ICD-10-CM | POA: Diagnosis not present

## 2021-09-07 DIAGNOSIS — R918 Other nonspecific abnormal finding of lung field: Secondary | ICD-10-CM | POA: Diagnosis not present

## 2021-09-07 DIAGNOSIS — U071 COVID-19: Secondary | ICD-10-CM

## 2021-09-07 DIAGNOSIS — I428 Other cardiomyopathies: Secondary | ICD-10-CM | POA: Diagnosis not present

## 2021-09-07 DIAGNOSIS — G4733 Obstructive sleep apnea (adult) (pediatric): Secondary | ICD-10-CM | POA: Diagnosis present

## 2021-09-07 DIAGNOSIS — Z8041 Family history of malignant neoplasm of ovary: Secondary | ICD-10-CM

## 2021-09-07 DIAGNOSIS — Z7982 Long term (current) use of aspirin: Secondary | ICD-10-CM

## 2021-09-07 DIAGNOSIS — R0689 Other abnormalities of breathing: Secondary | ICD-10-CM | POA: Diagnosis not present

## 2021-09-07 DIAGNOSIS — G9349 Other encephalopathy: Secondary | ICD-10-CM

## 2021-09-07 DIAGNOSIS — J440 Chronic obstructive pulmonary disease with acute lower respiratory infection: Secondary | ICD-10-CM | POA: Diagnosis present

## 2021-09-07 DIAGNOSIS — Z800 Family history of malignant neoplasm of digestive organs: Secondary | ICD-10-CM

## 2021-09-07 DIAGNOSIS — M069 Rheumatoid arthritis, unspecified: Secondary | ICD-10-CM | POA: Diagnosis present

## 2021-09-07 DIAGNOSIS — I959 Hypotension, unspecified: Secondary | ICD-10-CM | POA: Diagnosis not present

## 2021-09-07 DIAGNOSIS — I5022 Chronic systolic (congestive) heart failure: Secondary | ICD-10-CM | POA: Diagnosis not present

## 2021-09-07 DIAGNOSIS — I952 Hypotension due to drugs: Secondary | ICD-10-CM | POA: Diagnosis not present

## 2021-09-07 DIAGNOSIS — Z79631 Long term (current) use of antimetabolite agent: Secondary | ICD-10-CM

## 2021-09-07 DIAGNOSIS — R062 Wheezing: Secondary | ICD-10-CM | POA: Diagnosis not present

## 2021-09-07 DIAGNOSIS — Z833 Family history of diabetes mellitus: Secondary | ICD-10-CM

## 2021-09-07 DIAGNOSIS — J449 Chronic obstructive pulmonary disease, unspecified: Secondary | ICD-10-CM | POA: Diagnosis present

## 2021-09-07 DIAGNOSIS — N1832 Chronic kidney disease, stage 3b: Secondary | ICD-10-CM

## 2021-09-07 DIAGNOSIS — F321 Major depressive disorder, single episode, moderate: Secondary | ICD-10-CM | POA: Diagnosis present

## 2021-09-07 DIAGNOSIS — E785 Hyperlipidemia, unspecified: Secondary | ICD-10-CM | POA: Diagnosis present

## 2021-09-07 DIAGNOSIS — Y92239 Unspecified place in hospital as the place of occurrence of the external cause: Secondary | ICD-10-CM | POA: Diagnosis not present

## 2021-09-07 DIAGNOSIS — T380X5A Adverse effect of glucocorticoids and synthetic analogues, initial encounter: Secondary | ICD-10-CM | POA: Diagnosis not present

## 2021-09-07 DIAGNOSIS — R079 Chest pain, unspecified: Secondary | ICD-10-CM | POA: Diagnosis not present

## 2021-09-07 DIAGNOSIS — R7989 Other specified abnormal findings of blood chemistry: Secondary | ICD-10-CM | POA: Diagnosis not present

## 2021-09-07 DIAGNOSIS — I10 Essential (primary) hypertension: Secondary | ICD-10-CM | POA: Diagnosis not present

## 2021-09-07 DIAGNOSIS — R0902 Hypoxemia: Secondary | ICD-10-CM

## 2021-09-07 DIAGNOSIS — I2510 Atherosclerotic heart disease of native coronary artery without angina pectoris: Secondary | ICD-10-CM | POA: Diagnosis present

## 2021-09-07 DIAGNOSIS — Z743 Need for continuous supervision: Secondary | ICD-10-CM | POA: Diagnosis not present

## 2021-09-07 DIAGNOSIS — R0789 Other chest pain: Secondary | ICD-10-CM | POA: Diagnosis not present

## 2021-09-07 HISTORY — DX: Other encephalopathy: G93.49

## 2021-09-07 HISTORY — DX: COVID-19: U07.1

## 2021-09-07 LAB — CBC
HCT: 38 % — ABNORMAL LOW (ref 39.0–52.0)
Hemoglobin: 13.4 g/dL (ref 13.0–17.0)
MCH: 35.4 pg — ABNORMAL HIGH (ref 26.0–34.0)
MCHC: 35.3 g/dL (ref 30.0–36.0)
MCV: 100.5 fL — ABNORMAL HIGH (ref 80.0–100.0)
Platelets: 477 10*3/uL — ABNORMAL HIGH (ref 150–400)
RBC: 3.78 MIL/uL — ABNORMAL LOW (ref 4.22–5.81)
RDW: 17.4 % — ABNORMAL HIGH (ref 11.5–15.5)
WBC: 6.7 10*3/uL (ref 4.0–10.5)
nRBC: 0 % (ref 0.0–0.2)

## 2021-09-07 LAB — BASIC METABOLIC PANEL
Anion gap: 6 (ref 5–15)
BUN: 19 mg/dL (ref 8–23)
CO2: 28 mmol/L (ref 22–32)
Calcium: 8.3 mg/dL — ABNORMAL LOW (ref 8.9–10.3)
Chloride: 99 mmol/L (ref 98–111)
Creatinine, Ser: 1.45 mg/dL — ABNORMAL HIGH (ref 0.61–1.24)
GFR, Estimated: 53 mL/min — ABNORMAL LOW (ref >60)
Glucose, Bld: 138 mg/dL — ABNORMAL HIGH (ref 70–99)
Potassium: 2.9 mmol/L — ABNORMAL LOW (ref 3.5–5.1)
Sodium: 133 mmol/L — ABNORMAL LOW (ref 135–145)

## 2021-09-07 LAB — PROTIME-INR
INR: 1.1 (ref 0.8–1.2)
Prothrombin Time: 14.2 seconds (ref 11.4–15.2)

## 2021-09-07 LAB — TROPONIN I (HIGH SENSITIVITY)
Troponin I (High Sensitivity): 32 ng/L — ABNORMAL HIGH (ref <18)
Troponin I (High Sensitivity): 32 ng/L — ABNORMAL HIGH (ref <18)

## 2021-09-07 LAB — RESP PANEL BY RT-PCR (FLU A&B, COVID) ARPGX2
Influenza A by PCR: NEGATIVE
Influenza B by PCR: NEGATIVE
SARS Coronavirus 2 by RT PCR: POSITIVE — AB

## 2021-09-07 MED ORDER — SODIUM CHLORIDE 0.9 % IV SOLN
200.0000 mg | Freq: Once | INTRAVENOUS | Status: AC
  Administered 2021-09-07: 22:00:00 200 mg via INTRAVENOUS
  Filled 2021-09-07: qty 200

## 2021-09-07 MED ORDER — ASCORBIC ACID 500 MG PO TABS
500.0000 mg | ORAL_TABLET | Freq: Every day | ORAL | Status: DC
  Administered 2021-09-07 – 2021-09-13 (×7): 500 mg via ORAL
  Filled 2021-09-07 (×7): qty 1

## 2021-09-07 MED ORDER — SODIUM CHLORIDE 0.9 % IV SOLN
100.0000 mg | Freq: Every day | INTRAVENOUS | Status: AC
  Administered 2021-09-08 – 2021-09-11 (×4): 100 mg via INTRAVENOUS
  Filled 2021-09-07: qty 100
  Filled 2021-09-07 (×3): qty 20

## 2021-09-07 MED ORDER — GUAIFENESIN-DM 100-10 MG/5ML PO SYRP
10.0000 mL | ORAL_SOLUTION | ORAL | Status: DC | PRN
  Administered 2021-09-08 – 2021-09-12 (×9): 10 mL via ORAL
  Filled 2021-09-07 (×9): qty 10

## 2021-09-07 MED ORDER — ONDANSETRON HCL 4 MG/2ML IJ SOLN
4.0000 mg | Freq: Four times a day (QID) | INTRAMUSCULAR | Status: DC | PRN
  Administered 2021-09-09 – 2021-09-12 (×3): 4 mg via INTRAVENOUS
  Filled 2021-09-07 (×4): qty 2

## 2021-09-07 MED ORDER — ALBUTEROL SULFATE HFA 108 (90 BASE) MCG/ACT IN AERS
2.0000 | INHALATION_SPRAY | Freq: Four times a day (QID) | RESPIRATORY_TRACT | Status: DC
  Administered 2021-09-08 – 2021-09-13 (×21): 2 via RESPIRATORY_TRACT
  Filled 2021-09-07: qty 6.7

## 2021-09-07 MED ORDER — ZINC SULFATE 220 (50 ZN) MG PO CAPS
220.0000 mg | ORAL_CAPSULE | Freq: Every day | ORAL | Status: DC
  Administered 2021-09-07 – 2021-09-13 (×7): 220 mg via ORAL
  Filled 2021-09-07 (×7): qty 1

## 2021-09-07 MED ORDER — HYDROCOD POLST-CPM POLST ER 10-8 MG/5ML PO SUER
5.0000 mL | Freq: Two times a day (BID) | ORAL | Status: DC | PRN
  Administered 2021-09-10 – 2021-09-12 (×4): 5 mL via ORAL
  Filled 2021-09-07 (×4): qty 5

## 2021-09-07 MED ORDER — ONDANSETRON HCL 4 MG PO TABS: 4.0000 mg | ORAL_TABLET | Freq: Four times a day (QID) | ORAL | Status: DC | PRN

## 2021-09-07 MED ORDER — ENOXAPARIN SODIUM 40 MG/0.4ML IJ SOSY
40.0000 mg | PREFILLED_SYRINGE | INTRAMUSCULAR | Status: DC
  Administered 2021-09-07: 23:00:00 40 mg via SUBCUTANEOUS
  Filled 2021-09-07: qty 0.4

## 2021-09-07 MED ORDER — ACETAMINOPHEN 325 MG PO TABS
650.0000 mg | ORAL_TABLET | Freq: Four times a day (QID) | ORAL | Status: DC | PRN
  Administered 2021-09-08: 23:00:00 650 mg via ORAL
  Filled 2021-09-07: qty 2

## 2021-09-07 MED ORDER — METHYLPREDNISOLONE SODIUM SUCC 125 MG IJ SOLR
0.5000 mg/kg | Freq: Two times a day (BID) | INTRAMUSCULAR | Status: AC
  Administered 2021-09-07 – 2021-09-10 (×6): 51.875 mg via INTRAVENOUS
  Filled 2021-09-07 (×6): qty 2

## 2021-09-07 MED ORDER — PREDNISONE 20 MG PO TABS
50.0000 mg | ORAL_TABLET | Freq: Every day | ORAL | Status: DC
  Administered 2021-09-11 – 2021-09-13 (×3): 50 mg via ORAL
  Filled 2021-09-07 (×3): qty 3

## 2021-09-07 MED ORDER — SODIUM CHLORIDE 0.9 % IV SOLN: Freq: Once | INTRAVENOUS | Status: AC

## 2021-09-07 NOTE — ED Triage Notes (Signed)
Pt to ED via [facility] with c/o CP that is located across his chest for the last 2 days that developed after coughing for a week. He was 90% on room air when they arrived. They gave him Albuterol TX and 125 mg of Solumedrol along with 3L [place] that brought his O2 to 99% He was also given 324 mg of ASA.

## 2021-09-07 NOTE — Progress Notes (Signed)
Remdesivir - Pharmacy Brief Note   O:  CXR: Mild subpleural patchy opacities in the lower lungs, favoring subpleural scarring, although mild atypical infection is possible. SpO2: > 90% on 3 L [place]   A/P:  Remdesivir 200 mg IVPB once followed by 100 mg IVPB daily x 4 days.   Tawnya Crook, PharmD, BCPS Clinical Pharmacist 09/07/2021 8:17 PM

## 2021-09-07 NOTE — ED Provider Notes (Signed)
[facility] Emergency Department Provider Note   ____________________________________________   I have reviewed the triage vital signs and the nursing notes.   HISTORY  Chief Complaint Shortness of breath   History limited by:  Confusion, some history obtained from family at bedside   HPI Dylan Lee is a 65 y.o. male who presents to the emergency department today because of concern for shortness of breath and chest pain. The patient says that he has been sick for about a week. He does have some confusion so some clarification is provided by family at bedside. The patient initially was having shortness of breath and cough. Does have COPD but has not been using his nebulizer for the past week. The patient then developed some chest pain two days ago. The patient has also had decreased appetite. In terms of confusion the patient's family has noticed that he has bene slower to answer questions and incorrectly answered questions.   Records reviewed. Per medical record review patient has a history of COPD, CHF.  Past Medical History:  Diagnosis Date   CHF (congestive heart failure) ([place])    Hepatitis C    Hep C treated    History of chicken pox    History of measles    History of mumps    Hyperlipidemia    Hypertension    Kidney stones    Shingles 09/06/2015    Patient Active Problem List   Diagnosis Date Noted   History of colon polyps 01/14/2019   Dyslipidemia (high LDL; low HDL) 03/28/2018   CAD (coronary artery disease) 03/27/2018   Cardiac defibrillator in situ 12/06/2015   Urinary incontinence 12/05/2015   Allergic rhinitis 09/06/2015   Kidney stones 09/06/2015   Erectile dysfunction 09/06/2015   DOE (dyspnea on exertion) 09/06/2015   COPD (chronic obstructive pulmonary disease) ([place]) 09/06/2015   Bradycardia 43/15/4008   Chronic systolic heart failure ([place]) 03/02/2015   Congestive dilated cardiomyopathy ([place]) 03/02/2015   Pre-diabetes  01/14/2010   Tinnitus 06/24/2009   Psoriasis 04/15/2007   External hemorrhoids without complication 67/61/9509   OSA on CPAP 04/07/2007   Hepatitis C virus infection without hepatic coma 04/07/2007   Benign neoplasm of large bowel 04/07/2007   Rheumatoid arthritis ([place]) 03/24/2007   Benign essential HTN 03/24/2007   Esophageal reflux 03/24/2007   Depressive disorder 03/24/2007   Asthma 03/24/2007   Current tobacco use 03/24/2007    Past Surgical History:  Procedure Laterality Date   BUNIONECTOMY     CARDIAC CATHETERIZATION N/A 03/02/2015   Procedure: Right and Left Heart Cath;  Surgeon: Yolonda Kida, MD;  Location: [place] [facility];  Service: Cardiovascular;  Laterality: N/A;   DENTAL SURGERY     FOOT SURGERY Right    IMPLANTABLE CARDIOVERTER DEFIBRILLATOR (ICD) GENERATOR CHANGE Right 06/03/2015   Procedure: ICD generator insertion ;  Surgeon: Marzetta Board, MD;  Location: [facility] ORS;  Service: Cardiovascular;  Laterality: Right;   LITHOTRIPSY     sleep study  07/2010   severe OSA, snoring. Bilevel pressure at 19/14. Jiengel. ENT consult/ Jiengel: no ENT surgical indication for OSA   SPIROMETRY  10/20/2014   Severe obstruction    Prior to Admission medications   Medication Sig Start Date End Date Taking? Authorizing Provider  aspirin 81 MG tablet Take 81 mg by mouth daily.    [provider]  folic acid (FOLVITE) 1 MG tablet Take 1 mg by mouth daily. 08/31/19   [provider]  ketoconazole (NIZORAL)  2 % cream Apply 1 application topically 2 (two) times daily. 02/17/21   Birdie Sons, MD  methotrexate 50 MG/2ML injection INJECT 0.6 ML INTO THE SKIN ONCE A WEEK 05/29/21   Birdie Sons, MD  metoprolol succinate (TOPROL-XL) 25 MG 24 hr tablet Take 2 tablets by mouth daily.  10/20/14   Callwood, Dwayne D, MD  rosuvastatin (CRESTOR) 40 MG tablet Take 1 tablet (40 mg total) by mouth daily. 09/14/20   Birdie Sons, MD  sacubitril-valsartan (ENTRESTO)  97-103 MG Take 1 tablet by mouth 2 (two) times daily.    Callwood, Dwayne D, MD  spironolactone (ALDACTONE) 25 MG tablet Take 25 mg by mouth daily.    Yolonda Kida, MD  SYMBICORT 160-4.5 MCG/ACT inhaler INHALE 2 PUFFS INTO LUNGS TWICE DAILY 12/13/20   Birdie Sons, MD  tamsulosin (FLOMAX) 0.4 MG CAPS capsule Take 1 capsule (0.4 mg total) by mouth daily. 04/05/21   Birdie Sons, MD  torsemide (DEMADEX) 20 MG tablet Take 10 mg by mouth daily. 06/11/19   [provider]  triamcinolone cream (KENALOG) 0.1 % Apply 1 application topically 2 (two) times daily. 02/17/21   Birdie Sons, MD  venlafaxine XR (EFFEXOR XR) 75 MG 24 hr capsule Take 1 capsule (75 mg total) by mouth daily with breakfast. Take in addition to 150mg  tablet daily. 04/03/21   Birdie Sons, MD  venlafaxine XR (EFFEXOR-XR) 150 MG 24 hr capsule Take 1 capsule (150 mg total) by mouth daily with breakfast. Take in addition to 75mg  tablet every day 02/17/21   Birdie Sons, MD    Allergies Patient has no known allergies.  Family History  Problem Relation Age of Onset   Hypertension Mother    Bipolar disorder Mother    Ovarian cancer Mother    Heart disease Mother    Diabetes Father    Alcohol abuse Father    CAD Father    Depression Father    Alcohol abuse Sister    Congestive Heart Failure Brother    Alcohol abuse Brother    Liver cancer Brother     Social History Social History   Tobacco Use   Smoking status: Some Days    Types: Cigars    Last attempt to quit: 09/17/1988    Years since quitting: 32.9   Smokeless tobacco: Never   Tobacco comments:    smokes 1 cigar daily; quit cigs. 30+ years ago  Vaping Use   Vaping Use: Never used  Substance Use Topics   Alcohol use: No    Alcohol/week: 0.0 standard drinks   Drug use: No    Review of Systems Constitutional: Positive for low grade fever. Eyes: No visual changes. ENT: No sore throat. Cardiovascular: Positive for chest  pain. Respiratory: Positive for shortness of breath. Gastrointestinal: No abdominal pain.  No nausea, no vomiting.  No diarrhea.   Genitourinary: Negative for dysuria. Musculoskeletal: Negative for back pain. Skin: Negative for rash. Neurological: Positive for confusion.  ____________________________________________   PHYSICAL EXAM:  VITAL SIGNS: ED Triage Vitals  Enc Vitals Group     BP 09/07/21 1649 103/73     Pulse Rate 09/07/21 1649 95     Resp 09/07/21 1649 20     Temp 09/07/21 1649 100.3 F (37.9 C)     Temp Source 09/07/21 1649 Oral     SpO2 09/07/21 1649 99 %     Weight 09/07/21 1655 230 lb (104.3 kg)     Height  09/07/21 1655 5\' 11"  (1.803 m)     Head Circumference --      Peak Flow --      Pain Score 09/07/21 1651 4   Constitutional: Awake and alert. Not completely oriented.  Eyes: Conjunctivae are normal.  ENT      Head: Normocephalic and atraumatic.      Nose: No congestion/rhinnorhea.      Mouth/Throat: Mucous membranes are moist.      Neck: No stridor. Hematological/Lymphatic/Immunilogical: No cervical lymphadenopathy. Cardiovascular: Normal rate, regular rhythm.  No murmurs, rubs, or gallops.  Respiratory: Slight expiratory wheezing.  Gastrointestinal: Soft and non tender. No rebound. No guarding.  Genitourinary: Deferred Musculoskeletal: Normal range of motion in all extremities. No lower extremity edema. Neurologic:  Some confusion. Moving all extremities.  Skin:  Skin is warm, dry and intact. No rash noted.  ____________________________________________    LABS (pertinent positives/negatives)  Trop hs 32 COVID positive BMP na 133, k 2.9, glu 138, cr 1.45 CBC wbc 6.7, hgb 13.4, plt 477  ____________________________________________   EKG  I, Nance Pear, attending physician, personally viewed and interpreted this EKG  EKG Time: 1658 Rate: 97 Rhythm: normal sinus rhythm Axis: normal Intervals: qtc 497 QRS: narrow, q waves v1 ST  changes: no st elevation Impression: abnormal ekg ____________________________________________    RADIOLOGY  CXR Mild subpleural patchy opacities  ____________________________________________   PROCEDURES  Procedures  ____________________________________________   INITIAL IMPRESSION / ASSESSMENT AND PLAN / ED COURSE  Pertinent labs & imaging results that were available during my care of the patient were reviewed by me and considered in my medical decision making (see chart for details).   Patient presented to the emergency department today because of concern for shortness of breath and chest pain. Patient was put on oxygen by first responders. Patient did test positive for COVID and cxr is consistent with that diagnosis. Will plan on admission. Will start remdesivir. Discussed findings and plan with patient and family.     ____________________________________________   FINAL CLINICAL IMPRESSION(S) / ED DIAGNOSES  Final diagnoses:  COVID-19     Note: This dictation was prepared with Dragon dictation. Any transcriptional errors that result from this process are unintentional     Nance Pear, MD 09/07/21 2132

## 2021-09-07 NOTE — H&P (Signed)
History and Physical    [name]:[id number] DOB: August 18, 1956 DOA: 09/07/2021  PCP: Birdie Sons, MD   Patient coming from: home  I have personally briefly reviewed patient's relevant medical records in [facility]  Chief Complaint: cough and shortness of breath  HPI: Dylan Lee is a 65 y.o. male with medical history significant for Chronic systolic heart failure, nonischemic cardiomyopathy s/p AICD, CAD, HTN, CKD 3a, COPD, rheumatoid arthritis on methotrexate, who presents to the ED by EMS with a 1 week history of cough and shortness of breath, associated with chest pain on coughing for the past 2 days. Family also noted him to be confused the such history is limited.  History is taken mostly from ER records. On arrival of EMS O2 sat was 90% on room air and he was treated with albuterol and Solu-Medrol and placed on 3 L O2 with improvement in O2 sat to 99%.  He was also given 324 mg of aspirin  ED course: On arrival, T-max 100.3 and tachypneic to 22, pulse 95 with O2 sat 99% on 3 L.  Soft blood pressures Blood work WBC and hemoglobin globin WNL Sodium 133, potassium 2.9, creatinine 1.45 which is baseline Troponin 32-32 COVID-positive  EKG, personally viewed and interpreted: Sinus rhythm at 97 with no acute ST-T wave changes  Chest x-ray: Mild subpleural patchy opacities in the lower lungs favoring subpleural scarring although mild atypical infection is possible  Patient started on remdesivir.  Hospitalist consulted for admission.   Review of Systems: Unable to obtain due to lethargy  Assessment/Plan    Pneumonia due to COVID-19 virus with hypoxia -Remdesivir, albuterol antitussives, vitamins - Solu-Medrol - Supplemental oxygen to keep sats over 92%    Encephalopathy due to COVID-19 virus - Confusion reported by family secondary to acute infection - Neurologic checks with fall and aspiration precautions    Chronic systolic heart failure (HCC)    Cardiac defibrillator in situ - Appears euvolemic to dry - Hold home torsemide and spironolactone - Holding Entresto and metoprolol for now due to soft blood pressures  Hypotension, with history of hypertension - Hold antihypertensives including spironolactone, Entresto and metoprolol    Stage 3a chronic kidney disease (HCC) - Renal function at baseline    OSA on CPAP - CPAP nightly    COPD (chronic obstructive pulmonary disease) (HCC) - Albuterol as needed    CAD (coronary artery disease) - Continue aspirin, metoprolol, rosuvastatin    Rheumatoid arthritis (HCC) - On methotrexate    Depressive disorder - Continue venlafaxine   DVT prophylaxis: Lovenox  Code Status: full code  Family Communication:  none  Disposition Plan: Back to previous home environment Consults called: none  Status:At the time of admission, it appears that the appropriate admission status for this patient is INPATIENT. This is judged to be reasonable and necessary in order to provide the required intensity of service to ensure the patient's safety given the presenting symptoms, physical exam findings, and initial radiographic and laboratory data in the context of their  Comorbid conditions.   Patient requires inpatient status due to high intensity of service, high risk for further deterioration and high frequency of surveillance required.   I certify that at the point of admission it is my clinical judgment that the patient will require inpatient hospital care spanning beyond 2 midnights     Physical Exam: Vitals:   09/07/21 1649 09/07/21 1655 09/07/21 1846 09/07/21 2030  BP: 103/73  96/78 108/80  Pulse:  95  89 67  Resp: 20  (!) 22 20  Temp: 100.3 F (37.9 C)  98.3 F (36.8 C)   TempSrc: Oral     SpO2: 99%  96% 96%  Weight:  104.3 kg    Height:  5\' 11"  (1.803 m)     Constitutional: Somnolent, lethargic.  Arousable but will readily fall back asleep. Not in any apparent distress HEENT:       Head: Normocephalic and atraumatic.         Eyes: PERLA, EOMI, Conjunctivae are normal. Sclera is non-icteric.       Mouth/Throat: Deferred due to covid      Neck: Supple with no signs of meningismus. Cardiovascular: Regular rate and rhythm. No murmurs, gallops, or rubs. 2+ symmetrical distal pulses are present . No JVD. No  LE edema Respiratory: Respiratory effort slightly increased with coarse breath sounds bilaterally Gastrointestinal: Soft, non tender, non distended. Positive bowel sounds.  Genitourinary: No CVA tenderness. Musculoskeletal: Nontender with normal range of motion in all extremities. No cyanosis, or erythema of extremities. Neurologic:  Face is symmetric. Moving all extremities. No gross focal neurologic deficits . Skin: Skin is warm, dry.  No rash or ulcers Psychiatric: Unable to assess due to lethargy    Past Medical History:  Diagnosis Date   CHF (congestive heart failure) (HCC)    Hepatitis C    Hep C treated    History of chicken pox    History of measles    History of mumps    Hyperlipidemia    Hypertension    Kidney stones    Shingles 09/06/2015    Past Surgical History:  Procedure Laterality Date   BUNIONECTOMY     CARDIAC CATHETERIZATION N/A 03/02/2015   Procedure: Right and Left Heart Cath;  Surgeon: Yolonda Kida, MD;  Location: [place] [facility];  Service: Cardiovascular;  Laterality: N/A;   DENTAL SURGERY     FOOT SURGERY Right    IMPLANTABLE CARDIOVERTER DEFIBRILLATOR (ICD) GENERATOR CHANGE Right 06/03/2015   Procedure: ICD generator insertion ;  Surgeon: Marzetta Board, MD;  Location: [facility];  Service: Cardiovascular;  Laterality: Right;   LITHOTRIPSY     sleep study  07/2010   severe OSA, snoring. Bilevel pressure at 19/14. Jiengel. ENT consult/ Jiengel: no ENT surgical indication for OSA   SPIROMETRY  10/20/2014   Severe obstruction     reports that he has been smoking cigars. He has never used smokeless tobacco. He reports that  he does not drink alcohol and does not use drugs.  No Known Allergies  Family History  Problem Relation Age of Onset   Hypertension Mother    Bipolar disorder Mother    Ovarian cancer Mother    Heart disease Mother    Diabetes Father    Alcohol abuse Father    CAD Father    Depression Father    Alcohol abuse Sister    Congestive Heart Failure Brother    Alcohol abuse Brother    Liver cancer Brother       Prior to Admission medications   Medication Sig Start Date End Date Taking? Authorizing Provider  aspirin 81 MG tablet Take 81 mg by mouth daily.    [provider]  folic acid (FOLVITE) 1 MG tablet Take 1 mg by mouth daily. 08/31/19   [provider]  ketoconazole (NIZORAL) 2 % cream Apply 1 application topically 2 (two) times daily. 02/17/21   Birdie Sons, MD  methotrexate 50 MG/2ML injection INJECT 0.6 ML INTO THE SKIN ONCE A WEEK 05/29/21   Birdie Sons, MD  metoprolol succinate (TOPROL-XL) 25 MG 24 hr tablet Take 2 tablets by mouth daily.  10/20/14   Callwood, Dwayne D, MD  rosuvastatin (CRESTOR) 40 MG tablet Take 1 tablet (40 mg total) by mouth daily. 09/14/20   Birdie Sons, MD  sacubitril-valsartan (ENTRESTO) 97-103 MG Take 1 tablet by mouth 2 (two) times daily.    Callwood, Dwayne D, MD  spironolactone (ALDACTONE) 25 MG tablet Take 25 mg by mouth daily.    Yolonda Kida, MD  SYMBICORT 160-4.5 MCG/ACT inhaler INHALE 2 PUFFS INTO LUNGS TWICE DAILY 12/13/20   Birdie Sons, MD  tamsulosin (FLOMAX) 0.4 MG CAPS capsule Take 1 capsule (0.4 mg total) by mouth daily. 04/05/21   Birdie Sons, MD  torsemide (DEMADEX) 20 MG tablet Take 10 mg by mouth daily. 06/11/19   [provider]  triamcinolone cream (KENALOG) 0.1 % Apply 1 application topically 2 (two) times daily. 02/17/21   Birdie Sons, MD  venlafaxine XR (EFFEXOR XR) 75 MG 24 hr capsule Take 1 capsule (75 mg total) by mouth daily with breakfast. Take in addition to 150mg  tablet  daily. 04/03/21   Birdie Sons, MD  venlafaxine XR (EFFEXOR-XR) 150 MG 24 hr capsule Take 1 capsule (150 mg total) by mouth daily with breakfast. Take in addition to 75mg  tablet every day 02/17/21   Birdie Sons, MD      Labs on Admission: I have personally reviewed following labs and imaging studies  CBC: Recent Labs  Lab 09/07/21 1706  WBC 6.7  HGB 13.4  HCT 38.0*  MCV 100.5*  PLT 967*   Basic Metabolic Panel: Recent Labs  Lab 09/07/21 1706  NA 133*  K 2.9*  CL 99  CO2 28  GLUCOSE 138*  BUN 19  CREATININE 1.45*  CALCIUM 8.3*   GFR: Estimated Creatinine Clearance: 61.6 mL/min (A) (by C-G formula based on SCr of 1.45 mg/dL (H)). Liver Function Tests: No results for input(s): AST, ALT, ALKPHOS, BILITOT, PROT, ALBUMIN in the last 168 hours. No results for input(s): LIPASE, AMYLASE in the last 168 hours. No results for input(s): AMMONIA in the last 168 hours. Coagulation Profile: Recent Labs  Lab 09/07/21 1706  INR 1.1   Cardiac Enzymes: No results for input(s): CKTOTAL, CKMB, CKMBINDEX, TROPONINI in the last 168 hours. BNP (last 3 results) No results for input(s): PROBNP in the last 8760 hours. HbA1C: No results for input(s): HGBA1C in the last 72 hours. CBG: No results for input(s): GLUCAP in the last 168 hours. Lipid Profile: No results for input(s): CHOL, HDL, LDLCALC, TRIG, CHOLHDL, LDLDIRECT in the last 72 hours. Thyroid Function Tests: No results for input(s): TSH, T4TOTAL, FREET4, T3FREE, THYROIDAB in the last 72 hours. Anemia Panel: No results for input(s): VITAMINB12, FOLATE, FERRITIN, TIBC, IRON, RETICCTPCT in the last 72 hours. Urine analysis:    Component Value Date/Time   COLORURINE STRAW (A) 05/25/2015 1014   APPEARANCEUR CLEAR (A) 05/25/2015 1014   LABSPEC 1.009 05/25/2015 1014   PHURINE 6.0 05/25/2015 1014   GLUCOSEU NEGATIVE 05/25/2015 1014   HGBUR NEGATIVE 05/25/2015 1014   BILIRUBINUR negative 04/03/2021 1343   KETONESUR  NEGATIVE 05/25/2015 1014   PROTEINUR Negative 04/03/2021 1343   PROTEINUR NEGATIVE 05/25/2015 1014   UROBILINOGEN 0.2 04/03/2021 1343   NITRITE negative 04/03/2021 1343   NITRITE NEGATIVE 05/25/2015 1014   LEUKOCYTESUR Negative 04/03/2021 1343  Radiological Exams on Admission: DG Chest 2 View  Result Date: 09/07/2021 CLINICAL DATA:  Chest pain, cough EXAM: CHEST - 2 VIEW COMPARISON:  11/08/2017 FINDINGS: Mild subpleural patchy opacities in the lower lungs. No pleural effusion or pneumothorax. Heart is normal in size.  Right subclavian ICD. Mild degenerative changes of the visualized thoracolumbar spine. IMPRESSION: Mild subpleural patchy opacities in the lower lungs, favoring subpleural scarring, although mild atypical infection is possible. This is new from 2019. Electronically Signed   By: Julian Hy M.D.   On: 09/07/2021 17:58       Athena Masse MD [facility]   09/07/2021, 9:31 PM

## 2021-09-08 ENCOUNTER — Encounter: Payer: Self-pay | Admitting: Internal Medicine

## 2021-09-08 DIAGNOSIS — I959 Hypotension, unspecified: Secondary | ICD-10-CM

## 2021-09-08 DIAGNOSIS — I952 Hypotension due to drugs: Secondary | ICD-10-CM

## 2021-09-08 DIAGNOSIS — Z95810 Presence of automatic (implantable) cardiac defibrillator: Secondary | ICD-10-CM

## 2021-09-08 DIAGNOSIS — I2510 Atherosclerotic heart disease of native coronary artery without angina pectoris: Secondary | ICD-10-CM | POA: Diagnosis not present

## 2021-09-08 DIAGNOSIS — G4733 Obstructive sleep apnea (adult) (pediatric): Secondary | ICD-10-CM

## 2021-09-08 DIAGNOSIS — N1831 Chronic kidney disease, stage 3a: Secondary | ICD-10-CM

## 2021-09-08 DIAGNOSIS — U071 COVID-19: Secondary | ICD-10-CM | POA: Diagnosis not present

## 2021-09-08 DIAGNOSIS — G9349 Other encephalopathy: Secondary | ICD-10-CM

## 2021-09-08 DIAGNOSIS — Z9989 Dependence on other enabling machines and devices: Secondary | ICD-10-CM

## 2021-09-08 LAB — MAGNESIUM: Magnesium: 2.3 mg/dL (ref 1.7–2.4)

## 2021-09-08 LAB — COMPREHENSIVE METABOLIC PANEL
ALT: 68 U/L — ABNORMAL HIGH (ref 0–44)
AST: 62 U/L — ABNORMAL HIGH (ref 15–41)
Albumin: 2.9 g/dL — ABNORMAL LOW (ref 3.5–5.0)
Alkaline Phosphatase: 43 U/L (ref 38–126)
Anion gap: 9 (ref 5–15)
BUN: 27 mg/dL — ABNORMAL HIGH (ref 8–23)
CO2: 26 mmol/L (ref 22–32)
Calcium: 8.5 mg/dL — ABNORMAL LOW (ref 8.9–10.3)
Chloride: 99 mmol/L (ref 98–111)
Creatinine, Ser: 1.57 mg/dL — ABNORMAL HIGH (ref 0.61–1.24)
GFR, Estimated: 48 mL/min — ABNORMAL LOW (ref >60)
Glucose, Bld: 206 mg/dL — ABNORMAL HIGH (ref 70–99)
Potassium: 3.2 mmol/L — ABNORMAL LOW (ref 3.5–5.1)
Sodium: 134 mmol/L — ABNORMAL LOW (ref 135–145)
Total Bilirubin: 1.2 mg/dL (ref 0.3–1.2)
Total Protein: 7.1 g/dL (ref 6.5–8.1)

## 2021-09-08 LAB — CBC WITH DIFFERENTIAL/PLATELET
Abs Immature Granulocytes: 0.06 10*3/uL (ref 0.00–0.07)
Basophils Absolute: 0 10*3/uL (ref 0.0–0.1)
Basophils Relative: 1 %
Eosinophils Absolute: 0 10*3/uL (ref 0.0–0.5)
Eosinophils Relative: 0 %
HCT: 39.1 % (ref 39.0–52.0)
Hemoglobin: 13.5 g/dL (ref 13.0–17.0)
Immature Granulocytes: 1 %
Lymphocytes Relative: 13 %
Lymphs Abs: 0.8 10*3/uL (ref 0.7–4.0)
MCH: 33.8 pg (ref 26.0–34.0)
MCHC: 34.5 g/dL (ref 30.0–36.0)
MCV: 98 fL (ref 80.0–100.0)
Monocytes Absolute: 0.4 10*3/uL (ref 0.1–1.0)
Monocytes Relative: 7 %
Neutro Abs: 4.5 10*3/uL (ref 1.7–7.7)
Neutrophils Relative %: 78 %
Platelets: 447 10*3/uL — ABNORMAL HIGH (ref 150–400)
RBC: 3.99 MIL/uL — ABNORMAL LOW (ref 4.22–5.81)
RDW: 17.2 % — ABNORMAL HIGH (ref 11.5–15.5)
Smear Review: NORMAL
WBC: 5.8 10*3/uL (ref 4.0–10.5)
nRBC: 0 % (ref 0.0–0.2)

## 2021-09-08 LAB — D-DIMER, QUANTITATIVE: D-Dimer, Quant: 1.13 ug/mL-FEU — ABNORMAL HIGH (ref 0.00–0.50)

## 2021-09-08 LAB — C-REACTIVE PROTEIN: CRP: 14.7 mg/dL — ABNORMAL HIGH (ref <1.0)

## 2021-09-08 LAB — HIV ANTIBODY (ROUTINE TESTING W REFLEX): HIV Screen 4th Generation wRfx: NONREACTIVE

## 2021-09-08 LAB — PHOSPHORUS: Phosphorus: 4.7 mg/dL — ABNORMAL HIGH (ref 2.5–4.6)

## 2021-09-08 MED ORDER — ENOXAPARIN SODIUM 60 MG/0.6ML IJ SOSY
0.5000 mg/kg | PREFILLED_SYRINGE | INTRAMUSCULAR | Status: DC
  Administered 2021-09-08 – 2021-09-12 (×5): 52.5 mg via SUBCUTANEOUS
  Filled 2021-09-08 (×5): qty 0.6

## 2021-09-08 MED ORDER — ALUM & MAG HYDROXIDE-SIMETH 200-200-20 MG/5ML PO SUSP: 30.0000 mL | ORAL | Status: DC | PRN

## 2021-09-08 MED ORDER — IPRATROPIUM BROMIDE HFA 17 MCG/ACT IN AERS
2.0000 | INHALATION_SPRAY | Freq: Three times a day (TID) | RESPIRATORY_TRACT | Status: DC
  Administered 2021-09-08 – 2021-09-13 (×15): 2 via RESPIRATORY_TRACT
  Filled 2021-09-08: qty 12.9

## 2021-09-08 MED ORDER — POTASSIUM CHLORIDE CRYS ER 20 MEQ PO TBCR
40.0000 meq | EXTENDED_RELEASE_TABLET | Freq: Two times a day (BID) | ORAL | Status: DC
  Administered 2021-09-08 – 2021-09-09 (×3): 40 meq via ORAL
  Filled 2021-09-08 (×3): qty 2

## 2021-09-08 NOTE — ED Notes (Signed)
Pt is asleep at this time.

## 2021-09-08 NOTE — Progress Notes (Signed)
PHARMACIST - PHYSICIAN COMMUNICATION  CONCERNING:  Enoxaparin (Lovenox) for DVT Prophylaxis    RECOMMENDATION: Patient was prescribed enoxaprin 40mg  q24 hours for VTE prophylaxis.   Filed Weights   09/07/21 1655  Weight: 104.3 kg (230 lb)    Body mass index is 32.08 kg/m.  Estimated Creatinine Clearance: 56.9 mL/min (A) (by C-G formula based on SCr of 1.57 mg/dL (H)).   Based on [place] patient is candidate for enoxaparin 0.5mg /kg TBW SQ every 24 hours based on BMI being >30.  DESCRIPTION: Pharmacy has adjusted enoxaparin dose per [facility] policy.  Patient is now receiving enoxaparin 52.5 mg every 24 hours    Sherilyn Banker, PharmD Clinical Pharmacist  09/08/2021 10:37 AM

## 2021-09-08 NOTE — ED Notes (Signed)
MD Ouida Sills notified at this time of frequent runs of 2-4 PVC's in a row on this pt.

## 2021-09-08 NOTE — ED Notes (Signed)
Pt given breakfast tray at this time. 

## 2021-09-08 NOTE — Progress Notes (Signed)
PROGRESS NOTE  Dylan Lee    DOB: 1955-12-12, 65 y.o.  FYB:[id number]  PCP: Birdie Sons, MD   Code Status: Full Code   DOA: 09/07/2021   LOS: 1  Brief Narrative of Current Hospitalization  Dylan Lee is a 65 y.o. male with a PMH significant for nonischemic cardiomyopathy s/p AICD, CAD, HTN, CKD 3a, COPD, rheumatoid arthritis on methotrexate. They presented from home to the ED on 09/07/2021 with cough, SOB x7 days. In the ED, it was found that they had COVID. They were treated with supplemental oxygen, albuterol, steroids, remdesivir.  Patient was admitted to medicine service for further workup and management of COVID-19 pneumonia as outlined in detail below.  09/08/21 -stable on 3L O2  Assessment & Plan  Principal Problem:   Pneumonia due to COVID-19 virus Active Problems:   Chronic systolic heart failure (HCC)   Rheumatoid arthritis (HCC)   OSA on CPAP   Depressive disorder   COPD (chronic obstructive pulmonary disease) (HCC)   Cardiac defibrillator in situ   CAD (coronary artery disease)   Encephalopathy due to COVID-19 virus   Hypoxia   Stage 3a chronic kidney disease ([place])   Hypotension    Pneumonia due to COVID-19 virus with hypoxia. Has no home oxygen requirement. Does have COPD and adherent with CPAP at night -Remdesivir, albuterol, antitussives, vitamins - Solu-Medrol - Supplemental oxygen to keep sats over 88% - wean oxygen as tolerated. Ambulate with pulse ox once ORA   Encephalopathy due to COVID-19 virus- resolved   HFrEF- EF 30-35% in 2016   Cardiac defibrillator in situ - Appears euvolemic to dry - Hold home torsemide and spironolactone - Holding Entresto and metoprolol for now due to soft blood pressures   Hypotension, with history of hypertension - Hold antihypertensives including spironolactone, Entresto and metoprolol. Titrate up as needed     Stage 3a chronic kidney disease (HCC) - Renal function at baseline     OSA on CPAP -  CPAP nightly     COPD (chronic obstructive pulmonary disease) (HCC) - Albuterol as needed - ipratropium + albuterol MDI scheduled     CAD (coronary artery disease) - Continue aspirin, metoprolol, rosuvastatin     Rheumatoid arthritis ([place]) - On methotrexate     Depressive disorder - Continue venlafaxine  DVT prophylaxis: enoxaparin (LOVENOX) injection 40 mg Start: 09/07/21 2200   Diet:  Diet Orders (From admission, onward)     Start     Ordered   09/07/21 2140  Diet Heart Room service appropriate? Yes; Fluid consistency: Thin  Diet effective now       Question Answer Comment  Room service appropriate? Yes   Fluid consistency: Thin      09/07/21 2140            Subjective 09/08/21    Pt reports feeling overall improved. He has no complaints or questions at this time.   Disposition Plan & Communication  Patient status: Inpatient  Admitted From: Home Disposition: Home Anticipated discharge date: 12/24  Family Communication: none at bedside  Consults, Procedures, Significant Events  Consultants:  none  Procedures/significant events:  None  Antimicrobials:  Anti-infectives (From admission, onward)    Start     Dose/Rate Route Frequency Ordered Stop   09/08/21 1000  remdesivir 100 mg in sodium chloride 0.9 % 100 mL IVPB       See Hyperspace for full Linked Orders Report.   100 mg 200 mL/hr over 30 Minutes Intravenous Daily 09/07/21  1942 09/12/21 0959   09/07/21 2100  remdesivir 200 mg in sodium chloride 0.9% 250 mL IVPB       See Hyperspace for full Linked Orders Report.   200 mg 580 mL/hr over 30 Minutes Intravenous Once 09/07/21 1942 09/07/21 2300       Objective   Vitals:   09/07/21 2300 09/08/21 0200 09/08/21 0500 09/08/21 0630  BP: 114/83 (!) 144/97 92/71 96/66   Pulse: 65 (!) 57 77 72  Resp: 13 15 (!) 30 (!) 25  Temp:      TempSrc:      SpO2: 93% 94% 95% 96%  Weight:      Height:        Intake/Output Summary (Last 24 hours) at 09/08/2021  0715 Last data filed at 09/08/2021 0103 Gross per 24 hour  Intake 565.05 ml  Output --  Net 565.05 ml   Filed Weights   09/07/21 1655  Weight: 104.3 kg    Patient BMI: Body mass index is 32.08 kg/m.   Physical Exam:  General: awake, alert, NAD HEENT: atraumatic, clear conjunctiva, anicteric sclera, MMM, hearing grossly normal Respiratory: normal respiratory effort. Scattered wheezes throughout. Negative rhales Cardiovascular: normal S1/S2, RRR, no JVD, murmurs, quick capillary refill  Gastrointestinal: soft, NT, ND Nervous: A&O x3. no gross focal neurologic deficits, normal speech Extremities: moves all equally, no edema, normal tone Skin: dry, intact, normal temperature, normal color. No rashes, lesions or ulcers on exposed skin Psychiatry: normal mood, congruent affect  Labs   I have personally reviewed following labs and imaging studies Admission on 09/07/2021  Component Date Value Ref Range Status   Sodium 09/07/2021 133 (L)  135 - 145 mmol/L Final   Potassium 09/07/2021 2.9 (L)  3.5 - 5.1 mmol/L Final   Chloride 09/07/2021 99  98 - 111 mmol/L Final   CO2 09/07/2021 28  22 - 32 mmol/L Final   Glucose, Bld 09/07/2021 138 (H)  70 - 99 mg/dL Final   BUN 09/07/2021 19  8 - 23 mg/dL Final   Creatinine, Ser 09/07/2021 1.45 (H)  0.61 - 1.24 mg/dL Final   Calcium 09/07/2021 8.3 (L)  8.9 - 10.3 mg/dL Final   GFR, Estimated 09/07/2021 53 (L)  >60 mL/min Final   Anion gap 09/07/2021 6  5 - 15 Final   WBC 09/07/2021 6.7  4.0 - 10.5 K/uL Final   RBC 09/07/2021 3.78 (L)  4.22 - 5.81 MIL/uL Final   Hemoglobin 09/07/2021 13.4  13.0 - 17.0 g/dL Final   HCT 09/07/2021 38.0 (L)  39.0 - 52.0 % Final   MCV 09/07/2021 100.5 (H)  80.0 - 100.0 fL Final   MCH 09/07/2021 35.4 (H)  26.0 - 34.0 pg Final   MCHC 09/07/2021 35.3  30.0 - 36.0 g/dL Final   RDW 09/07/2021 17.4 (H)  11.5 - 15.5 % Final   Platelets 09/07/2021 477 (H)  150 - 400 K/uL Final   nRBC 09/07/2021 0.0  0.0 - 0.2 % Final    Troponin I (High Sensitivity) 09/07/2021 32 (H)  <18 ng/L Final   SARS Coronavirus 2 by RT PCR 09/07/2021 POSITIVE (A)  NEGATIVE Final   Influenza A by PCR 09/07/2021 NEGATIVE  NEGATIVE Final   Influenza B by PCR 09/07/2021 NEGATIVE  NEGATIVE Final   Prothrombin Time 09/07/2021 14.2  11.4 - 15.2 seconds Final   INR 09/07/2021 1.1  0.8 - 1.2 Final   Troponin I (High Sensitivity) 09/07/2021 32 (H)  <18 ng/L Final   Sodium 09/08/2021  134 (L)  135 - 145 mmol/L Final   Potassium 09/08/2021 3.2 (L)  3.5 - 5.1 mmol/L Final   Chloride 09/08/2021 99  98 - 111 mmol/L Final   CO2 09/08/2021 26  22 - 32 mmol/L Final   Glucose, Bld 09/08/2021 206 (H)  70 - 99 mg/dL Final   BUN 09/08/2021 27 (H)  8 - 23 mg/dL Final   Creatinine, Ser 09/08/2021 1.57 (H)  0.61 - 1.24 mg/dL Final   Calcium 09/08/2021 8.5 (L)  8.9 - 10.3 mg/dL Final   Total Protein 09/08/2021 7.1  6.5 - 8.1 g/dL Final   Albumin 09/08/2021 2.9 (L)  3.5 - 5.0 g/dL Final   AST 09/08/2021 62 (H)  15 - 41 U/L Final   ALT 09/08/2021 68 (H)  0 - 44 U/L Final   Alkaline Phosphatase 09/08/2021 43  38 - 126 U/L Final   Total Bilirubin 09/08/2021 1.2  0.3 - 1.2 mg/dL Final   GFR, Estimated 09/08/2021 48 (L)  >60 mL/min Final   Anion gap 09/08/2021 9  5 - 15 Final   D-Dimer, Quant 09/08/2021 1.13 (H)  0.00 - 0.50 ug/mL-FEU Final   Magnesium 09/08/2021 2.3  1.7 - 2.4 mg/dL Final   Phosphorus 09/08/2021 4.7 (H)  2.5 - 4.6 mg/dL Final    Imaging Studies  DG Chest 2 View  Result Date: 09/07/2021 CLINICAL DATA:  Chest pain, cough EXAM: CHEST - 2 VIEW COMPARISON:  11/08/2017 FINDINGS: Mild subpleural patchy opacities in the lower lungs. No pleural effusion or pneumothorax. Heart is normal in size.  Right subclavian ICD. Mild degenerative changes of the visualized thoracolumbar spine. IMPRESSION: Mild subpleural patchy opacities in the lower lungs, favoring subpleural scarring, although mild atypical infection is possible. This is new from 2019.  Electronically Signed   By: Julian Hy M.D.   On: 09/07/2021 17:58    Medications   Scheduled Meds:  albuterol  2 puff Inhalation Q6H   vitamin C  500 mg Oral Daily   enoxaparin (LOVENOX) injection  40 mg Subcutaneous Q24H   methylPREDNISolone (SOLU-MEDROL) injection  0.5 mg/kg Intravenous Q12H   Followed by   Derrill Memo ON 09/11/2021] predniSONE  50 mg Oral Daily   zinc sulfate  220 mg Oral Daily   No recently discontinued medications to reconcile  LOS: 1 day   Time spent: >48min  Eldor Conaway L Shyra Emile, DO [facility] 09/08/2021, 7:15 AM   Available by Epic secure chat 7AM-7PM. If 7PM-7AM, please contact night-coverage Refer to [email or web address] to contact the [facility] Attending or Consulting provider for this pt

## 2021-09-09 DIAGNOSIS — U071 COVID-19: Secondary | ICD-10-CM | POA: Diagnosis not present

## 2021-09-09 DIAGNOSIS — J1282 Pneumonia due to coronavirus disease 2019: Secondary | ICD-10-CM | POA: Diagnosis not present

## 2021-09-09 LAB — CBC WITH DIFFERENTIAL/PLATELET
Abs Immature Granulocytes: 0.15 10*3/uL — ABNORMAL HIGH (ref 0.00–0.07)
Basophils Absolute: 0 10*3/uL (ref 0.0–0.1)
Basophils Relative: 0 %
Eosinophils Absolute: 0 10*3/uL (ref 0.0–0.5)
Eosinophils Relative: 0 %
HCT: 40.3 % (ref 39.0–52.0)
Hemoglobin: 14.7 g/dL (ref 13.0–17.0)
Immature Granulocytes: 2 %
Lymphocytes Relative: 10 %
Lymphs Abs: 1 10*3/uL (ref 0.7–4.0)
MCH: 36.5 pg — ABNORMAL HIGH (ref 26.0–34.0)
MCHC: 36.5 g/dL — ABNORMAL HIGH (ref 30.0–36.0)
MCV: 100 fL (ref 80.0–100.0)
Monocytes Absolute: 1.2 10*3/uL — ABNORMAL HIGH (ref 0.1–1.0)
Monocytes Relative: 12 %
Neutro Abs: 7.4 10*3/uL (ref 1.7–7.7)
Neutrophils Relative %: 76 %
Platelets: 620 10*3/uL — ABNORMAL HIGH (ref 150–400)
RBC: 4.03 MIL/uL — ABNORMAL LOW (ref 4.22–5.81)
RDW: 16.7 % — ABNORMAL HIGH (ref 11.5–15.5)
WBC: 9.7 10*3/uL (ref 4.0–10.5)
nRBC: 0 % (ref 0.0–0.2)

## 2021-09-09 LAB — D-DIMER, QUANTITATIVE: D-Dimer, Quant: 1.02 ug/mL-FEU — ABNORMAL HIGH (ref 0.00–0.50)

## 2021-09-09 LAB — MAGNESIUM: Magnesium: 2.3 mg/dL (ref 1.7–2.4)

## 2021-09-09 LAB — COMPREHENSIVE METABOLIC PANEL
ALT: 64 U/L — ABNORMAL HIGH (ref 0–44)
AST: 41 U/L (ref 15–41)
Albumin: 2.8 g/dL — ABNORMAL LOW (ref 3.5–5.0)
Alkaline Phosphatase: 47 U/L (ref 38–126)
Anion gap: 9 (ref 5–15)
BUN: 36 mg/dL — ABNORMAL HIGH (ref 8–23)
CO2: 26 mmol/L (ref 22–32)
Calcium: 9.4 mg/dL (ref 8.9–10.3)
Chloride: 104 mmol/L (ref 98–111)
Creatinine, Ser: 1.43 mg/dL — ABNORMAL HIGH (ref 0.61–1.24)
GFR, Estimated: 54 mL/min — ABNORMAL LOW (ref >60)
Glucose, Bld: 187 mg/dL — ABNORMAL HIGH (ref 70–99)
Potassium: 3.9 mmol/L (ref 3.5–5.1)
Sodium: 139 mmol/L (ref 135–145)
Total Bilirubin: 0.8 mg/dL (ref 0.3–1.2)
Total Protein: 6.7 g/dL (ref 6.5–8.1)

## 2021-09-09 LAB — C-REACTIVE PROTEIN: CRP: 9.8 mg/dL — ABNORMAL HIGH (ref <1.0)

## 2021-09-09 LAB — PHOSPHORUS: Phosphorus: 3.7 mg/dL (ref 2.5–4.6)

## 2021-09-09 LAB — PROCALCITONIN: Procalcitonin: 0.58 ng/mL

## 2021-09-09 MED ORDER — TAMSULOSIN HCL 0.4 MG PO CAPS
0.4000 mg | ORAL_CAPSULE | Freq: Every day | ORAL | Status: DC
  Administered 2021-09-10 – 2021-09-13 (×4): 0.4 mg via ORAL
  Filled 2021-09-09 (×4): qty 1

## 2021-09-09 MED ORDER — FLUTICASONE FUROATE-VILANTEROL 200-25 MCG/ACT IN AEPB
1.0000 | INHALATION_SPRAY | Freq: Every day | RESPIRATORY_TRACT | Status: DC
  Administered 2021-09-10 – 2021-09-13 (×4): 1 via RESPIRATORY_TRACT
  Filled 2021-09-09: qty 28

## 2021-09-09 MED ORDER — VENLAFAXINE HCL ER 75 MG PO CP24
150.0000 mg | ORAL_CAPSULE | Freq: Every day | ORAL | Status: DC
  Administered 2021-09-10 – 2021-09-13 (×4): 150 mg via ORAL
  Filled 2021-09-09 (×4): qty 2

## 2021-09-09 MED ORDER — FOLIC ACID 1 MG PO TABS
1.0000 mg | ORAL_TABLET | Freq: Every day | ORAL | Status: DC
  Administered 2021-09-09 – 2021-09-13 (×5): 1 mg via ORAL
  Filled 2021-09-09 (×5): qty 1

## 2021-09-09 MED ORDER — SACUBITRIL-VALSARTAN 97-103 MG PO TABS
1.0000 | ORAL_TABLET | Freq: Two times a day (BID) | ORAL | Status: DC
  Administered 2021-09-09 – 2021-09-13 (×8): 1 via ORAL
  Filled 2021-09-09 (×10): qty 1

## 2021-09-09 MED ORDER — ROSUVASTATIN CALCIUM 10 MG PO TABS
40.0000 mg | ORAL_TABLET | Freq: Every day | ORAL | Status: DC
  Administered 2021-09-10 – 2021-09-13 (×3): 40 mg via ORAL
  Filled 2021-09-09 (×3): qty 4

## 2021-09-09 MED ORDER — ASPIRIN 81 MG PO CHEW
81.0000 mg | CHEWABLE_TABLET | Freq: Every day | ORAL | Status: DC
  Administered 2021-09-09 – 2021-09-13 (×5): 81 mg via ORAL
  Filled 2021-09-09 (×5): qty 1

## 2021-09-09 NOTE — Plan of Care (Signed)
°  Problem: Respiratory: Goal: Will maintain a patent airway Outcome: Progressing   Problem: Respiratory: Goal: Complications related to the disease process, condition or treatment will be avoided or minimized Outcome: Progressing   Problem: Education: Goal: Knowledge of risk factors and measures for prevention of condition will improve Outcome: Progressing

## 2021-09-09 NOTE — Plan of Care (Signed)
  Problem: Education: Goal: Knowledge of risk factors and measures for prevention of condition will improve Outcome: Progressing   Problem: Coping: Goal: Psychosocial and spiritual needs will be supported Outcome: Progressing   Problem: Respiratory: Goal: Will maintain a patent airway Outcome: Progressing Goal: Complications related to the disease process, condition or treatment will be avoided or minimized Outcome: Progressing   

## 2021-09-09 NOTE — TOC CM/SW Note (Signed)
°  Transition of Care [facility]) Screening Note   Patient Details  Name: Dylan Lee Date of Birth: 1955/12/14   Transition of Care [facility]) CM/SW Contact:    Magnus Ivan, LCSW Phone Number: 09/09/2021, 9:09 AM    Transition of Care Department [facility]) has reviewed patient and no TOC needs have been identified at this time. We will continue to monitor patient advancement through interdisciplinary progression rounds. If new patient transition needs arise, please place a TOC consult.

## 2021-09-09 NOTE — Progress Notes (Signed)
Pt noted with moderate tan colored emesis, with consistency of undigested food. Zofran 4 mg given IV x1 with effectiveness noted. Will continue to monitor.

## 2021-09-09 NOTE — Progress Notes (Signed)
Notified on call provider of frequent PVC's and episodes of Vtach.  Orders received to continue cardiac telemetry.

## 2021-09-09 NOTE — Progress Notes (Signed)
PROGRESS NOTE    Dylan Lee  BJS:[id number] DOB: [birth year] DOA: 09/07/2021 PCP: Birdie Sons, MD   Brief Narrative: Taken from prior notes. Dylan Lee is a 65 y.o. male with a PMH significant for nonischemic cardiomyopathy s/p AICD, CAD, HTN, CKD 3a, COPD, rheumatoid arthritis on methotrexate. They presented from home to the ED on 09/07/2021 with cough, SOB x7 days. In the ED, it was found that they had COVID. They were treated with supplemental oxygen, albuterol, steroids, remdesivir.  Admitted for COVID-19 pneumonia with new oxygen requirement.  Subjective: Patient was seen and examined today.  Continues to have some cough.  Denies any chest pain or shortness of breath.  Assessment & Plan:   Principal Problem:   Pneumonia due to COVID-19 virus Active Problems:   Chronic systolic heart failure (HCC)   Rheumatoid arthritis (HCC)   OSA on CPAP   Depressive disorder   COPD (chronic obstructive pulmonary disease) (HCC)   Cardiac defibrillator in situ   CAD (coronary artery disease)   Encephalopathy due to COVID-19 virus   Hypoxia   Stage 3a chronic kidney disease ([place])   Hypotension  Acute hypoxic respiratory failure secondary to COVID-19 pneumonia.  Elevated inflammatory markers.  No baseline oxygen use. -Continue with remdesivir-day 3 -Check procalcitonin -Continue with steroid -Continue with supplemental oxygen-wean as tolerated -Continue with supportive care and supplements  Chronic HFrEF.  EF of 30 to 35% in 2016.  S/p AICD.  Appears euvolemic. Initially home dose of Entresto, diuretic and metoprolol was held due to softer blood pressure. -Restart home dose of Entresto -Keep holding diuretics and metoprolol-we will add as needed -Monitor volume status  History of CAD.  No chest pain. -Continue home dose of aspirin and statin  CKD stage IIIa.  Creatinine seems to be around baseline. -Monitor renal function -Avoid nephrotoxins  COPD.  Not on home  oxygen -Continue as needed bronchodilators  Rheumatoid arthritis.  On weekly methotrexate at home -Currently holding-can resume on discharge  History of depression. -Continue home dose of venlafaxine  Obstructive sleep apnea. -Continue CPAP at night  Objective: Vitals:   09/08/21 2313 09/09/21 0000 09/09/21 0332 09/09/21 0844  BP: 131/71  131/83 128/65  Pulse: 68  77 62  Resp: (!) 22 17 18 18   Temp: 97.7 F (36.5 C)  97.8 F (36.6 C) 98.7 F (37.1 C)  TempSrc: Oral     SpO2: 93%  97% 96%  Weight:      Height:        Intake/Output Summary (Last 24 hours) at 09/09/2021 1708 Last data filed at 09/09/2021 6073 Gross per 24 hour  Intake 240 ml  Output 400 ml  Net -160 ml   Filed Weights   09/07/21 1655  Weight: 104.3 kg    Examination:  General exam: Appears calm and comfortable  Respiratory system: Clear to auscultation. Respiratory effort normal. Cardiovascular system: S1 & S2 heard, RRR.  Gastrointestinal system: Soft, nontender, nondistended, bowel sounds positive. Central nervous system: Alert and oriented. No focal neurological deficits. Extremities: No edema, no cyanosis, pulses intact and symmetrical. Psychiatry: Judgement and insight appear normal.    DVT prophylaxis: Lovenox Code Status: Full Family Communication:  Disposition Plan:  Status is: Inpatient  Remains inpatient appropriate because: Severity of illness   Level of care: Med-Surg  All the records are reviewed and case discussed with Care Management/Social Worker. Management plans discussed with the patient, nursing and they are in agreement.  Consultants:  None  Procedures:  Antimicrobials:   Data Reviewed: I have personally reviewed following labs and imaging studies  CBC: Recent Labs  Lab 09/07/21 1706 09/08/21 0553 09/09/21 0431  WBC 6.7 5.8 9.7  NEUTROABS  --  4.5 7.4  HGB 13.4 13.5 14.7  HCT 38.0* 39.1 40.3  MCV 100.5* 98.0 100.0  PLT 477* 447* 620*   Basic  Metabolic Panel: Recent Labs  Lab 09/07/21 1706 09/08/21 0553 09/09/21 0431  NA 133* 134* 139  K 2.9* 3.2* 3.9  CL 99 99 104  CO2 28 26 26   GLUCOSE 138* 206* 187*  BUN 19 27* 36*  CREATININE 1.45* 1.57* 1.43*  CALCIUM 8.3* 8.5* 9.4  MG  --  2.3 2.3  PHOS  --  4.7* 3.7   GFR: Estimated Creatinine Clearance: 62.5 mL/min (A) (by C-G formula based on SCr of 1.43 mg/dL (H)). Liver Function Tests: Recent Labs  Lab 09/08/21 0553 09/09/21 0431  AST 62* 41  ALT 68* 64*  ALKPHOS 43 47  BILITOT 1.2 0.8  PROT 7.1 6.7  ALBUMIN 2.9* 2.8*   No results for input(s): LIPASE, AMYLASE in the last 168 hours. No results for input(s): AMMONIA in the last 168 hours. Coagulation Profile: Recent Labs  Lab 09/07/21 1706  INR 1.1   Cardiac Enzymes: No results for input(s): CKTOTAL, CKMB, CKMBINDEX, TROPONINI in the last 168 hours. BNP (last 3 results) No results for input(s): PROBNP in the last 8760 hours. HbA1C: No results for input(s): HGBA1C in the last 72 hours. CBG: No results for input(s): GLUCAP in the last 168 hours. Lipid Profile: No results for input(s): CHOL, HDL, LDLCALC, TRIG, CHOLHDL, LDLDIRECT in the last 72 hours. Thyroid Function Tests: No results for input(s): TSH, T4TOTAL, FREET4, T3FREE, THYROIDAB in the last 72 hours. Anemia Panel: No results for input(s): VITAMINB12, FOLATE, FERRITIN, TIBC, IRON, RETICCTPCT in the last 72 hours. Sepsis Labs: No results for input(s): PROCALCITON, LATICACIDVEN in the last 168 hours.  Recent Results (from the past 240 hour(s))  Resp Panel by RT-PCR (Flu A&B, Covid) Nasopharyngeal Swab     Status: Abnormal   Collection Time: 09/07/21  5:06 PM   Specimen: Nasopharyngeal Swab; Nasopharyngeal(NP) swabs in vial transport medium  Result Value Ref Range Status   SARS Coronavirus 2 by RT PCR POSITIVE (A) NEGATIVE Final    Comment: (NOTE) SARS-CoV-2 target nucleic acids are DETECTED.  The SARS-CoV-2 RNA is generally detectable in upper  respiratory specimens during the acute phase of infection. Positive results are indicative of the presence of the identified virus, but do not rule out bacterial infection or co-infection with other pathogens not detected by the test. Clinical correlation with patient history and other diagnostic information is necessary to determine patient infection status. The expected result is Negative.  Fact Sheet for Patients: EntrepreneurPulse.com.au  Fact Sheet for Healthcare Providers: IncredibleEmployment.be  This test is not yet approved or cleared by the [place] FDA and  has been authorized for detection and/or diagnosis of SARS-CoV-2 by FDA under an Emergency Use Authorization (EUA).  This EUA will remain in effect (meaning this test can be used) for the duration of  the COVID-19 declaration under Section 564(b)(1) of the A ct, 21 U.S.C. section 360bbb-3(b)(1), unless the authorization is terminated or revoked sooner.     Influenza A by PCR NEGATIVE NEGATIVE Final   Influenza B by PCR NEGATIVE NEGATIVE Final    Comment: (NOTE) The Xpert Xpress SARS-CoV-2/FLU/RSV plus assay is intended as an aid in the diagnosis of influenza from  Nasopharyngeal swab specimens and should not be used as a sole basis for treatment. Nasal washings and aspirates are unacceptable for Xpert Xpress SARS-CoV-2/FLU/RSV testing.  Fact Sheet for Patients: EntrepreneurPulse.com.au  Fact Sheet for Healthcare Providers: IncredibleEmployment.be  This test is not yet approved or cleared by the [place] FDA and has been authorized for detection and/or diagnosis of SARS-CoV-2 by FDA under an Emergency Use Authorization (EUA). This EUA will remain in effect (meaning this test can be used) for the duration of the COVID-19 declaration under Section 564(b)(1) of the Act, 21 U.S.C. section 360bbb-3(b)(1), unless the authorization is  terminated or revoked.  Performed at [facility], [address], [place], [place] [address]      Radiology Studies: DG Chest 2 View  Result Date: 09/07/2021 CLINICAL DATA:  Chest pain, cough EXAM: CHEST - 2 VIEW COMPARISON:  11/08/2017 FINDINGS: Mild subpleural patchy opacities in the lower lungs. No pleural effusion or pneumothorax. Heart is normal in size.  Right subclavian ICD. Mild degenerative changes of the visualized thoracolumbar spine. IMPRESSION: Mild subpleural patchy opacities in the lower lungs, favoring subpleural scarring, although mild atypical infection is possible. This is new from 2019. Electronically Signed   By: Julian Hy M.D.   On: 09/07/2021 17:58    Scheduled Meds:  albuterol  2 puff Inhalation Q6H   vitamin C  500 mg Oral Daily   enoxaparin (LOVENOX) injection  0.5 mg/kg Subcutaneous Q24H   ipratropium  2 puff Inhalation Q8H   methylPREDNISolone (SOLU-MEDROL) injection  0.5 mg/kg Intravenous Q12H   Followed by   Derrill Memo ON 09/11/2021] predniSONE  50 mg Oral Daily   zinc sulfate  220 mg Oral Daily   Continuous Infusions:  remdesivir 100 mg in NS 100 mL 100 mg (09/09/21 0937)     LOS: 2 days   Time spent: 40 minutes. More than 50% of the time was spent in counseling/coordination of care  Lorella Nimrod, MD [facility]  If 7PM-7AM, please contact night-coverage [email or web address]  09/09/2021, 5:08 PM   This record has been created using Systems analyst. Errors have been sought and corrected,but may not always be located. Such creation errors do not reflect on the standard of care.

## 2021-09-10 DIAGNOSIS — U071 COVID-19: Secondary | ICD-10-CM | POA: Diagnosis not present

## 2021-09-10 DIAGNOSIS — J1282 Pneumonia due to coronavirus disease 2019: Secondary | ICD-10-CM | POA: Diagnosis not present

## 2021-09-10 LAB — D-DIMER, QUANTITATIVE: D-Dimer, Quant: 1.24 ug/mL-FEU — ABNORMAL HIGH (ref 0.00–0.50)

## 2021-09-10 LAB — COMPREHENSIVE METABOLIC PANEL
ALT: 48 U/L — ABNORMAL HIGH (ref 0–44)
AST: 25 U/L (ref 15–41)
Albumin: 2.7 g/dL — ABNORMAL LOW (ref 3.5–5.0)
Alkaline Phosphatase: 41 U/L (ref 38–126)
Anion gap: 6 (ref 5–15)
BUN: 40 mg/dL — ABNORMAL HIGH (ref 8–23)
CO2: 28 mmol/L (ref 22–32)
Calcium: 9.2 mg/dL (ref 8.9–10.3)
Chloride: 104 mmol/L (ref 98–111)
Creatinine, Ser: 1.44 mg/dL — ABNORMAL HIGH (ref 0.61–1.24)
GFR, Estimated: 54 mL/min — ABNORMAL LOW (ref >60)
Glucose, Bld: 130 mg/dL — ABNORMAL HIGH (ref 70–99)
Potassium: 3.8 mmol/L (ref 3.5–5.1)
Sodium: 138 mmol/L (ref 135–145)
Total Bilirubin: 0.7 mg/dL (ref 0.3–1.2)
Total Protein: 6.9 g/dL (ref 6.5–8.1)

## 2021-09-10 LAB — CBC WITH DIFFERENTIAL/PLATELET
Abs Immature Granulocytes: 0.13 10*3/uL — ABNORMAL HIGH (ref 0.00–0.07)
Basophils Absolute: 0 10*3/uL (ref 0.0–0.1)
Basophils Relative: 0 %
Eosinophils Absolute: 0 10*3/uL (ref 0.0–0.5)
Eosinophils Relative: 0 %
HCT: 41.3 % (ref 39.0–52.0)
Hemoglobin: 14.2 g/dL (ref 13.0–17.0)
Immature Granulocytes: 1 %
Lymphocytes Relative: 15 %
Lymphs Abs: 1.6 10*3/uL (ref 0.7–4.0)
MCH: 34.1 pg — ABNORMAL HIGH (ref 26.0–34.0)
MCHC: 34.4 g/dL (ref 30.0–36.0)
MCV: 99.3 fL (ref 80.0–100.0)
Monocytes Absolute: 2 10*3/uL — ABNORMAL HIGH (ref 0.1–1.0)
Monocytes Relative: 18 %
Neutro Abs: 7 10*3/uL (ref 1.7–7.7)
Neutrophils Relative %: 66 %
Platelets: 631 10*3/uL — ABNORMAL HIGH (ref 150–400)
RBC: 4.16 MIL/uL — ABNORMAL LOW (ref 4.22–5.81)
RDW: 17.1 % — ABNORMAL HIGH (ref 11.5–15.5)
WBC: 10.7 10*3/uL — ABNORMAL HIGH (ref 4.0–10.5)
nRBC: 0 % (ref 0.0–0.2)

## 2021-09-10 LAB — MAGNESIUM: Magnesium: 2.4 mg/dL (ref 1.7–2.4)

## 2021-09-10 LAB — MRSA NEXT GEN BY PCR, NASAL: MRSA by PCR Next Gen: NOT DETECTED

## 2021-09-10 LAB — C-REACTIVE PROTEIN: CRP: 4.4 mg/dL — ABNORMAL HIGH (ref <1.0)

## 2021-09-10 LAB — PHOSPHORUS: Phosphorus: 4.4 mg/dL (ref 2.5–4.6)

## 2021-09-10 MED ORDER — AZITHROMYCIN 250 MG PO TABS
500.0000 mg | ORAL_TABLET | Freq: Every day | ORAL | Status: DC
  Administered 2021-09-11 – 2021-09-13 (×3): 500 mg via ORAL
  Filled 2021-09-10 (×3): qty 2

## 2021-09-10 MED ORDER — SODIUM CHLORIDE 0.9 % IV SOLN
500.0000 mg | INTRAVENOUS | Status: DC
  Administered 2021-09-10: 12:00:00 500 mg via INTRAVENOUS
  Filled 2021-09-10: qty 5

## 2021-09-10 MED ORDER — SODIUM CHLORIDE 0.9 % IV SOLN
2.0000 g | INTRAVENOUS | Status: DC
  Administered 2021-09-10 – 2021-09-13 (×4): 2 g via INTRAVENOUS
  Filled 2021-09-10 (×4): qty 20
  Filled 2021-09-10: qty 2

## 2021-09-10 NOTE — Progress Notes (Signed)
Patient noted with frequent runs of PVCs, asymptomatic. On call provider, Sharion Settler, NP made aware.

## 2021-09-10 NOTE — Progress Notes (Signed)
PROGRESS NOTE    [name]  GYJ:[id number] DOB: 05-21-1956 DOA: 09/07/2021 PCP: Birdie Sons, MD   Brief Narrative: Taken from prior notes. Dylan Lee is a 65 y.o. male with a PMH significant for nonischemic cardiomyopathy s/p AICD, CAD, HTN, CKD 3a, COPD, rheumatoid arthritis on methotrexate. They presented from home to the ED on 09/07/2021 with cough, SOB x7 days. In the ED, it was found that they had COVID. They were treated with supplemental oxygen, albuterol, steroids, remdesivir.  Admitted for COVID-19 pneumonia with new oxygen requirement.  Subjective: Patient had one-time emesis overnight after eating dinner, mostly eaten food per nursing staff. He was able to tolerate breakfast, denies any nausea when seen today.  Continues to have cough. Wife and a friend at bedside.  Wife was very concerned about his mobility and other cardiac issues which are being addressed as outpatient.  Assessment & Plan:   Principal Problem:   Pneumonia due to COVID-19 virus Active Problems:   Chronic systolic heart failure (HCC)   Rheumatoid arthritis (HCC)   OSA on CPAP   Depressive disorder   COPD (chronic obstructive pulmonary disease) (HCC)   Cardiac defibrillator in situ   CAD (coronary artery disease)   Encephalopathy due to COVID-19 virus   Hypoxia   Stage 3a chronic kidney disease ([place])   Hypotension  Acute hypoxic respiratory failure secondary to COVID-19 pneumonia.  Elevated inflammatory markers.  No baseline oxygen use.PCT elevated at 0.58, patient developed leukocytosis which can be secondary to steroid but based on elevated procalcitonin we will start antibiotics with ceftriaxone and Zithromax for possible superadded bacterial infection. -Start him on ceftriaxone and Zithromax -Check MRSA swab -Continue with remdesivir-day 4 -Continue with steroid -Continue with supplemental oxygen-wean as tolerated -Continue with supportive care and supplements  Chronic HFrEF.   EF of 30 to 35% in 2016.  S/p AICD.  Appears euvolemic. Initially home dose of Entresto, diuretic and metoprolol was held due to softer blood pressure, we started Entresto on 09/09/2021 -Continue home dose of Entresto -Keep holding diuretics and metoprolol-we will add as needed -Monitor volume status  History of CAD.  No chest pain. -Continue home dose of aspirin and statin  CKD stage IIIa.  Creatinine seems to be around baseline. -Monitor renal function -Avoid nephrotoxins  COPD.  Not on home oxygen -Continue as needed bronchodilators  Rheumatoid arthritis.  On weekly methotrexate at home -Currently holding-can resume on discharge  History of depression. -Continue home dose of venlafaxine  Obstructive sleep apnea. -Continue CPAP at night  Objective: Vitals:   09/09/21 0844 09/09/21 2040 09/09/21 2057 09/10/21 0414  BP: 128/65 128/69  97/77  Pulse: 62 94  95  Resp: 18 14  19   Temp: 98.7 F (37.1 C)  98.2 F (36.8 C) (!) 97.1 F (36.2 C)  TempSrc:   Oral   SpO2: 96% 93%  94%  Weight:      Height:        Intake/Output Summary (Last 24 hours) at 09/10/2021 0844 Last data filed at 09/10/2021 0552 Gross per 24 hour  Intake 480 ml  Output 1600 ml  Net -1120 ml    Filed Weights   09/07/21 1655  Weight: 104.3 kg    Examination:  General.  Well-developed gentleman, in no acute distress. Pulmonary.  Lungs mostly clear, very scant scattered wheeze, normal respiratory effort. CV.  Regular rate and rhythm, no JVD, rub or murmur. Abdomen.  Soft, nontender, nondistended, BS positive. CNS.  Alert and oriented .  No focal neurologic deficit. Extremities.  No edema, no cyanosis, pulses intact and symmetrical. Psychiatry.  Judgment and insight appears normal.    DVT prophylaxis: Lovenox Code Status: Full Family Communication: Discussed with wife at bedside Disposition Plan:  Status is: Inpatient  Remains inpatient appropriate because: Severity of illness   Level  of care: Med-Surg  All the records are reviewed and case discussed with Care Management/Social Worker. Management plans discussed with the patient, nursing and they are in agreement.  Consultants:  None  Procedures:  Antimicrobials:  Ceftriaxone Zithromax  Data Reviewed: I have personally reviewed following labs and imaging studies  CBC: Recent Labs  Lab 09/07/21 1706 09/08/21 0553 09/09/21 0431 09/10/21 0619  WBC 6.7 5.8 9.7 10.7*  NEUTROABS  --  4.5 7.4 7.0  HGB 13.4 13.5 14.7 14.2  HCT 38.0* 39.1 40.3 41.3  MCV 100.5* 98.0 100.0 99.3  PLT 477* 447* 620* 631*    Basic Metabolic Panel: Recent Labs  Lab 09/07/21 1706 09/08/21 0553 09/09/21 0431 09/10/21 0619  NA 133* 134* 139 138  K 2.9* 3.2* 3.9 3.8  CL 99 99 104 104  CO2 28 26 26 28   GLUCOSE 138* 206* 187* 130*  BUN 19 27* 36* 40*  CREATININE 1.45* 1.57* 1.43* 1.44*  CALCIUM 8.3* 8.5* 9.4 9.2  MG  --  2.3 2.3 2.4  PHOS  --  4.7* 3.7 4.4    GFR: Estimated Creatinine Clearance: 62 mL/min (A) (by C-G formula based on SCr of 1.44 mg/dL (H)). Liver Function Tests: Recent Labs  Lab 09/08/21 0553 09/09/21 0431 09/10/21 0619  AST 62* 41 25  ALT 68* 64* 48*  ALKPHOS 43 47 41  BILITOT 1.2 0.8 0.7  PROT 7.1 6.7 6.9  ALBUMIN 2.9* 2.8* 2.7*    No results for input(s): LIPASE, AMYLASE in the last 168 hours. No results for input(s): AMMONIA in the last 168 hours. Coagulation Profile: Recent Labs  Lab 09/07/21 1706  INR 1.1    Cardiac Enzymes: No results for input(s): CKTOTAL, CKMB, CKMBINDEX, TROPONINI in the last 168 hours. BNP (last 3 results) No results for input(s): PROBNP in the last 8760 hours. HbA1C: No results for input(s): HGBA1C in the last 72 hours. CBG: No results for input(s): GLUCAP in the last 168 hours. Lipid Profile: No results for input(s): CHOL, HDL, LDLCALC, TRIG, CHOLHDL, LDLDIRECT in the last 72 hours. Thyroid Function Tests: No results for input(s): TSH, T4TOTAL, FREET4,  T3FREE, THYROIDAB in the last 72 hours. Anemia Panel: No results for input(s): VITAMINB12, FOLATE, FERRITIN, TIBC, IRON, RETICCTPCT in the last 72 hours. Sepsis Labs: Recent Labs  Lab 09/09/21 0431  PROCALCITON 0.58    Recent Results (from the past 240 hour(s))  Resp Panel by RT-PCR (Flu A&B, Covid) Nasopharyngeal Swab     Status: Abnormal   Collection Time: 09/07/21  5:06 PM   Specimen: Nasopharyngeal Swab; Nasopharyngeal(NP) swabs in vial transport medium  Result Value Ref Range Status   SARS Coronavirus 2 by RT PCR POSITIVE (A) NEGATIVE Final    Comment: (NOTE) SARS-CoV-2 target nucleic acids are DETECTED.  The SARS-CoV-2 RNA is generally detectable in upper respiratory specimens during the acute phase of infection. Positive results are indicative of the presence of the identified virus, but do not rule out bacterial infection or co-infection with other pathogens not detected by the test. Clinical correlation with patient history and other diagnostic information is necessary to determine patient infection status. The expected result is Negative.  Fact Sheet for Patients:  EntrepreneurPulse.com.au  Fact Sheet for Healthcare Providers: IncredibleEmployment.be  This test is not yet approved or cleared by the [place] FDA and  has been authorized for detection and/or diagnosis of SARS-CoV-2 by FDA under an Emergency Use Authorization (EUA).  This EUA will remain in effect (meaning this test can be used) for the duration of  the COVID-19 declaration under Section 564(b)(1) of the A ct, 21 U.S.C. section 360bbb-3(b)(1), unless the authorization is terminated or revoked sooner.     Influenza A by PCR NEGATIVE NEGATIVE Final   Influenza B by PCR NEGATIVE NEGATIVE Final    Comment: (NOTE) The Xpert Xpress SARS-CoV-2/FLU/RSV plus assay is intended as an aid in the diagnosis of influenza from Nasopharyngeal swab specimens and should not be  used as a sole basis for treatment. Nasal washings and aspirates are unacceptable for Xpert Xpress SARS-CoV-2/FLU/RSV testing.  Fact Sheet for Patients: EntrepreneurPulse.com.au  Fact Sheet for Healthcare Providers: IncredibleEmployment.be  This test is not yet approved or cleared by the [place] FDA and has been authorized for detection and/or diagnosis of SARS-CoV-2 by FDA under an Emergency Use Authorization (EUA). This EUA will remain in effect (meaning this test can be used) for the duration of the COVID-19 declaration under Section 564(b)(1) of the Act, 21 U.S.C. section 360bbb-3(b)(1), unless the authorization is terminated or revoked.  Performed at [facility], [address], [place], [place] [address]       Radiology Studies: No results found.  Scheduled Meds:  albuterol  2 puff Inhalation Q6H   vitamin C  500 mg Oral Daily   aspirin  81 mg Oral Daily   enoxaparin (LOVENOX) injection  0.5 mg/kg Subcutaneous Q24H   fluticasone furoate-vilanterol  1 puff Inhalation Daily   folic acid  1 mg Oral Daily   ipratropium  2 puff Inhalation Q8H   methylPREDNISolone (SOLU-MEDROL) injection  0.5 mg/kg Intravenous Q12H   Followed by   Derrill Memo ON 09/11/2021] predniSONE  50 mg Oral Daily   rosuvastatin  40 mg Oral Daily   sacubitril-valsartan  1 tablet Oral BID   tamsulosin  0.4 mg Oral Daily   venlafaxine XR  150 mg Oral Q breakfast   zinc sulfate  220 mg Oral Daily   Continuous Infusions:  azithromycin     cefTRIAXone (ROCEPHIN)  IV     remdesivir 100 mg in NS 100 mL Stopped (09/09/21 1100)     LOS: 3 days   Time spent: 41 minutes. More than 50% of the time was spent in counseling/coordination of care  Lorella Nimrod, MD [facility]  If 7PM-7AM, please contact night-coverage [email or web address]  09/10/2021, 8:44 AM   This record has been created using Systems analyst. Errors have been sought  and corrected,but may not always be located. Such creation errors do not reflect on the standard of care.

## 2021-09-10 NOTE — Progress Notes (Signed)
PHARMACIST - PHYSICIAN COMMUNICATION  CONCERNING: Antibiotic IV to Oral Route Change Policy  RECOMMENDATION: This patient is receiving azithromycin by the intravenous route.  Based on criteria approved by the Pharmacy and Therapeutics Committee, the antibiotic(s) is/are being converted to the equivalent oral dose form(s).   DESCRIPTION: These criteria include: Patient being treated for a respiratory tract infection, urinary tract infection, cellulitis or clostridium difficile associated diarrhea if on metronidazole The patient is not neutropenic and does not exhibit a GI malabsorption state The patient is eating (either orally or via tube) and/or has been taking other orally administered medications for a least 24 hours The patient is improving clinically and has a Tmax < 100.5  If you have questions about this conversion, please contact the [facility]  09/10/21

## 2021-09-11 DIAGNOSIS — U071 COVID-19: Secondary | ICD-10-CM | POA: Diagnosis not present

## 2021-09-11 DIAGNOSIS — J1282 Pneumonia due to coronavirus disease 2019: Secondary | ICD-10-CM | POA: Diagnosis not present

## 2021-09-11 LAB — COMPREHENSIVE METABOLIC PANEL
ALT: 47 U/L — ABNORMAL HIGH (ref 0–44)
AST: 34 U/L (ref 15–41)
Albumin: 2.7 g/dL — ABNORMAL LOW (ref 3.5–5.0)
Alkaline Phosphatase: 45 U/L (ref 38–126)
Anion gap: 5 (ref 5–15)
BUN: 38 mg/dL — ABNORMAL HIGH (ref 8–23)
CO2: 27 mmol/L (ref 22–32)
Calcium: 8.8 mg/dL — ABNORMAL LOW (ref 8.9–10.3)
Chloride: 108 mmol/L (ref 98–111)
Creatinine, Ser: 1.53 mg/dL — ABNORMAL HIGH (ref 0.61–1.24)
GFR, Estimated: 50 mL/min — ABNORMAL LOW (ref >60)
Glucose, Bld: 121 mg/dL — ABNORMAL HIGH (ref 70–99)
Potassium: 4.3 mmol/L (ref 3.5–5.1)
Sodium: 140 mmol/L (ref 135–145)
Total Bilirubin: 0.6 mg/dL (ref 0.3–1.2)
Total Protein: 6.2 g/dL — ABNORMAL LOW (ref 6.5–8.1)

## 2021-09-11 LAB — D-DIMER, QUANTITATIVE: D-Dimer, Quant: 1.49 ug/mL-FEU — ABNORMAL HIGH (ref 0.00–0.50)

## 2021-09-11 LAB — CBC WITH DIFFERENTIAL/PLATELET
Abs Immature Granulocytes: 0.37 10*3/uL — ABNORMAL HIGH (ref 0.00–0.07)
Basophils Absolute: 0.1 10*3/uL (ref 0.0–0.1)
Basophils Relative: 0 %
Eosinophils Absolute: 0 10*3/uL (ref 0.0–0.5)
Eosinophils Relative: 0 %
HCT: 42 % (ref 39.0–52.0)
Hemoglobin: 14.2 g/dL (ref 13.0–17.0)
Immature Granulocytes: 3 %
Lymphocytes Relative: 13 %
Lymphs Abs: 1.8 10*3/uL (ref 0.7–4.0)
MCH: 34 pg (ref 26.0–34.0)
MCHC: 33.8 g/dL (ref 30.0–36.0)
MCV: 100.5 fL — ABNORMAL HIGH (ref 80.0–100.0)
Monocytes Absolute: 2 10*3/uL — ABNORMAL HIGH (ref 0.1–1.0)
Monocytes Relative: 14 %
Neutro Abs: 9.5 10*3/uL — ABNORMAL HIGH (ref 1.7–7.7)
Neutrophils Relative %: 70 %
Platelets: 649 10*3/uL — ABNORMAL HIGH (ref 150–400)
RBC: 4.18 MIL/uL — ABNORMAL LOW (ref 4.22–5.81)
RDW: 16.9 % — ABNORMAL HIGH (ref 11.5–15.5)
WBC: 13.7 10*3/uL — ABNORMAL HIGH (ref 4.0–10.5)
nRBC: 0 % (ref 0.0–0.2)

## 2021-09-11 LAB — C-REACTIVE PROTEIN: CRP: 2.4 mg/dL — ABNORMAL HIGH (ref <1.0)

## 2021-09-11 LAB — PHOSPHORUS: Phosphorus: 4.2 mg/dL (ref 2.5–4.6)

## 2021-09-11 LAB — MAGNESIUM: Magnesium: 2.4 mg/dL (ref 1.7–2.4)

## 2021-09-11 MED ORDER — GLUCAGON HCL RDNA (DIAGNOSTIC) 1 MG IJ SOLR
INTRAMUSCULAR | Status: AC
  Filled 2021-09-11: qty 1

## 2021-09-11 MED ORDER — GLUCOSE 4 G PO CHEW
CHEWABLE_TABLET | ORAL | Status: AC
  Filled 2021-09-11: qty 1

## 2021-09-11 NOTE — Progress Notes (Signed)
PROGRESS NOTE    Dylan Lee  LKJ:[id number] DOB: 1956/07/27 DOA: 09/07/2021 PCP: Birdie Sons, MD   Brief Narrative: Taken from prior notes. Dylan Lee is a 65 y.o. male with a PMH significant for nonischemic cardiomyopathy s/p AICD, CAD, HTN, CKD 3a, COPD, rheumatoid arthritis on methotrexate. They presented from home to the ED on 09/07/2021 with cough, SOB x7 days. In the ED, it was found that they had COVID. They were treated with supplemental oxygen, albuterol, steroids, remdesivir.  Admitted for COVID-19 pneumonia with new oxygen requirement.  12/26: Patient remains on oxygen, some worsening of D-dimer, unable to obtain CTA due to CKD, VQ scan ordered.  Subjective: Patient was seen and examined today.  He thinks that he is improving, had a good night sleep.  No new concern.  Denies any leg pain or tenderness.  Discussed about getting a VQ scan and he agrees.  Assessment & Plan:   Principal Problem:   Pneumonia due to COVID-19 virus Active Problems:   Chronic systolic heart failure (HCC)   Rheumatoid arthritis (HCC)   OSA on CPAP   Depressive disorder   COPD (chronic obstructive pulmonary disease) (HCC)   Cardiac defibrillator in situ   CAD (coronary artery disease)   Encephalopathy due to COVID-19 virus   Hypoxia   Stage 3a chronic kidney disease ([place])   Hypotension  Acute hypoxic respiratory failure secondary to COVID-19 pneumonia.  Elevated inflammatory markers.  No baseline oxygen use.PCT elevated at 0.58, patient developed leukocytosis which can be secondary to steroid but based on elevated procalcitonin we will start antibiotics with ceftriaxone and Zithromax for possible superadded bacterial infection.  MRSA swab negative -Continue ceftriaxone and Zithromax -Continue with remdesivir-day 5 -Continue with steroid -Continue with supplemental oxygen-wean as tolerated -Continue with supportive care and supplements -VQ scan to rule out PE due to mild  worsening hypoxia and elevating D-dimer.  If  Chronic HFrEF.  EF of 30 to 35% in 2016.  S/p AICD.  Appears euvolemic. Initially home dose of Entresto, diuretic and metoprolol was held due to softer blood pressure, we started Entresto on 09/09/2021 -Continue home dose of Entresto -Keep holding diuretics and metoprolol-we will add as needed -Monitor volume status  History of CAD.  No chest pain. -Continue home dose of aspirin and statin  CKD stage IIIa.  Creatinine seems to be around baseline. -Monitor renal function -Avoid nephrotoxins  COPD.  Not on home oxygen -Continue as needed bronchodilators  Rheumatoid arthritis.  On weekly methotrexate at home -Currently holding-can resume on discharge  History of depression. -Continue home dose of venlafaxine  Obstructive sleep apnea. -Continue CPAP at night  Objective: Vitals:   09/10/21 1657 09/10/21 2012 09/11/21 0530 09/11/21 0910  BP: 129/82 140/81 127/82 100/72  Pulse: 98 88 78 96  Resp: 20 20 18 20   Temp: 98.6 F (37 C) 98.6 F (37 C) 98.7 F (37.1 C) 98.4 F (36.9 C)  TempSrc: Oral Oral Oral Oral  SpO2: 92% 93% 91% 93%  Weight:      Height:        Intake/Output Summary (Last 24 hours) at 09/11/2021 1552 Last data filed at 09/11/2021 1100 Gross per 24 hour  Intake 344.6 ml  Output 900 ml  Net -555.4 ml    Filed Weights   09/07/21 1655  Weight: 104.3 kg    Examination:  General.  Well-developed gentleman, in no acute distress. Pulmonary.  Lungs clear bilaterally, normal respiratory effort. CV.  Regular rate and rhythm,  no JVD, rub or murmur. Abdomen.  Soft, nontender, nondistended, BS positive. CNS.  Alert and oriented .  No focal neurologic deficit. Extremities.  No edema, no cyanosis, pulses intact and symmetrical.  No calf tenderness Psychiatry.  Judgment and insight appears normal.    DVT prophylaxis: Lovenox Code Status: Full Family Communication:  Disposition Plan:  Status is:  Inpatient  Remains inpatient appropriate because: Severity of illness   Level of care: Med-Surg  All the records are reviewed and case discussed with Care Management/Social Worker. Management plans discussed with the patient, nursing and they are in agreement.  Consultants:  None  Procedures:  Antimicrobials:  Ceftriaxone Zithromax  Data Reviewed: I have personally reviewed following labs and imaging studies  CBC: Recent Labs  Lab 09/07/21 1706 09/08/21 0553 09/09/21 0431 09/10/21 0619 09/11/21 0635  WBC 6.7 5.8 9.7 10.7* 13.7*  NEUTROABS  --  4.5 7.4 7.0 9.5*  HGB 13.4 13.5 14.7 14.2 14.2  HCT 38.0* 39.1 40.3 41.3 42.0  MCV 100.5* 98.0 100.0 99.3 100.5*  PLT 477* 447* 620* 631* 649*    Basic Metabolic Panel: Recent Labs  Lab 09/07/21 1706 09/08/21 0553 09/09/21 0431 09/10/21 0619 09/11/21 0635  NA 133* 134* 139 138 140  K 2.9* 3.2* 3.9 3.8 4.3  CL 99 99 104 104 108  CO2 28 26 26 28 27   GLUCOSE 138* 206* 187* 130* 121*  BUN 19 27* 36* 40* 38*  CREATININE 1.45* 1.57* 1.43* 1.44* 1.53*  CALCIUM 8.3* 8.5* 9.4 9.2 8.8*  MG  --  2.3 2.3 2.4 2.4  PHOS  --  4.7* 3.7 4.4 4.2    GFR: Estimated Creatinine Clearance: 58.4 mL/min (A) (by C-G formula based on SCr of 1.53 mg/dL (H)). Liver Function Tests: Recent Labs  Lab 09/08/21 0553 09/09/21 0431 09/10/21 0619 09/11/21 0635  AST 62* 41 25 34  ALT 68* 64* 48* 47*  ALKPHOS 43 47 41 45  BILITOT 1.2 0.8 0.7 0.6  PROT 7.1 6.7 6.9 6.2*  ALBUMIN 2.9* 2.8* 2.7* 2.7*    No results for input(s): LIPASE, AMYLASE in the last 168 hours. No results for input(s): AMMONIA in the last 168 hours. Coagulation Profile: Recent Labs  Lab 09/07/21 1706  INR 1.1    Cardiac Enzymes: No results for input(s): CKTOTAL, CKMB, CKMBINDEX, TROPONINI in the last 168 hours. BNP (last 3 results) No results for input(s): PROBNP in the last 8760 hours. HbA1C: No results for input(s): HGBA1C in the last 72 hours. CBG: No  results for input(s): GLUCAP in the last 168 hours. Lipid Profile: No results for input(s): CHOL, HDL, LDLCALC, TRIG, CHOLHDL, LDLDIRECT in the last 72 hours. Thyroid Function Tests: No results for input(s): TSH, T4TOTAL, FREET4, T3FREE, THYROIDAB in the last 72 hours. Anemia Panel: No results for input(s): VITAMINB12, FOLATE, FERRITIN, TIBC, IRON, RETICCTPCT in the last 72 hours. Sepsis Labs: Recent Labs  Lab 09/09/21 0431  PROCALCITON 0.58     Recent Results (from the past 240 hour(s))  Resp Panel by RT-PCR (Flu A&B, Covid) Nasopharyngeal Swab     Status: Abnormal   Collection Time: 09/07/21  5:06 PM   Specimen: Nasopharyngeal Swab; Nasopharyngeal(NP) swabs in vial transport medium  Result Value Ref Range Status   SARS Coronavirus 2 by RT PCR POSITIVE (A) NEGATIVE Final    Comment: (NOTE) SARS-CoV-2 target nucleic acids are DETECTED.  The SARS-CoV-2 RNA is generally detectable in upper respiratory specimens during the acute phase of infection. Positive results are indicative of the  presence of the identified virus, but do not rule out bacterial infection or co-infection with other pathogens not detected by the test. Clinical correlation with patient history and other diagnostic information is necessary to determine patient infection status. The expected result is Negative.  Fact Sheet for Patients: EntrepreneurPulse.com.au  Fact Sheet for Healthcare Providers: IncredibleEmployment.be  This test is not yet approved or cleared by the [place] FDA and  has been authorized for detection and/or diagnosis of SARS-CoV-2 by FDA under an Emergency Use Authorization (EUA).  This EUA will remain in effect (meaning this test can be used) for the duration of  the COVID-19 declaration under Section 564(b)(1) of the A ct, 21 U.S.C. section 360bbb-3(b)(1), unless the authorization is terminated or revoked sooner.     Influenza A by PCR NEGATIVE  NEGATIVE Final   Influenza B by PCR NEGATIVE NEGATIVE Final    Comment: (NOTE) The Xpert Xpress SARS-CoV-2/FLU/RSV plus assay is intended as an aid in the diagnosis of influenza from Nasopharyngeal swab specimens and should not be used as a sole basis for treatment. Nasal washings and aspirates are unacceptable for Xpert Xpress SARS-CoV-2/FLU/RSV testing.  Fact Sheet for Patients: EntrepreneurPulse.com.au  Fact Sheet for Healthcare Providers: IncredibleEmployment.be  This test is not yet approved or cleared by the [place] FDA and has been authorized for detection and/or diagnosis of SARS-CoV-2 by FDA under an Emergency Use Authorization (EUA). This EUA will remain in effect (meaning this test can be used) for the duration of the COVID-19 declaration under Section 564(b)(1) of the Act, 21 U.S.C. section 360bbb-3(b)(1), unless the authorization is terminated or revoked.  Performed at [facility], [address] [place], [place] [address]   MRSA Next Gen by PCR, Nasal     Status: None   Collection Time: 09/10/21  2:00 PM   Specimen: Nasal Mucosa; Nasal Swab  Result Value Ref Range Status   MRSA by PCR Next Gen NOT DETECTED NOT DETECTED Final    Comment: (NOTE) The GeneXpert MRSA Assay (FDA approved for NASAL specimens only), is one component of a comprehensive MRSA colonization surveillance program. It is not intended to diagnose MRSA infection nor to guide or monitor treatment for MRSA infections. Test performance is not FDA approved in patients less than 63 years old. Performed at [facility], [address] [place], [place] [address]       Radiology Studies: No results found.  Scheduled Meds:  albuterol  2 puff Inhalation Q6H   vitamin C  500 mg Oral Daily   aspirin  81 mg Oral Daily   azithromycin  500 mg Oral Daily   enoxaparin (LOVENOX) injection  0.5 mg/kg Subcutaneous Q24H   fluticasone  furoate-vilanterol  1 puff Inhalation Daily   folic acid  1 mg Oral Daily   ipratropium  2 puff Inhalation Q8H   predniSONE  50 mg Oral Daily   rosuvastatin  40 mg Oral Daily   sacubitril-valsartan  1 tablet Oral BID   tamsulosin  0.4 mg Oral Daily   venlafaxine XR  150 mg Oral Q breakfast   zinc sulfate  220 mg Oral Daily   Continuous Infusions:  cefTRIAXone (ROCEPHIN)  IV 2 g (09/11/21 1058)     LOS: 4 days   Time spent: 40 minutes. More than 50% of the time was spent in counseling/coordination of care  Lorella Nimrod, MD [facility]  If 7PM-7AM, please contact night-coverage [email or web address]  09/11/2021, 3:52 PM   This record has been  created using Systems analyst. Errors have been sought and corrected,but may not always be located. Such creation errors do not reflect on the standard of care.

## 2021-09-12 ENCOUNTER — Inpatient Hospital Stay: Payer: Medicare HMO

## 2021-09-12 ENCOUNTER — Encounter: Payer: Self-pay | Admitting: Internal Medicine

## 2021-09-12 DIAGNOSIS — U071 COVID-19: Secondary | ICD-10-CM | POA: Diagnosis not present

## 2021-09-12 DIAGNOSIS — J1282 Pneumonia due to coronavirus disease 2019: Secondary | ICD-10-CM | POA: Diagnosis not present

## 2021-09-12 LAB — C-REACTIVE PROTEIN: CRP: 1.5 mg/dL — ABNORMAL HIGH (ref <1.0)

## 2021-09-12 LAB — CBC WITH DIFFERENTIAL/PLATELET
Abs Immature Granulocytes: 0.52 10*3/uL — ABNORMAL HIGH (ref 0.00–0.07)
Basophils Absolute: 0.1 10*3/uL (ref 0.0–0.1)
Basophils Relative: 1 %
Eosinophils Absolute: 0 10*3/uL (ref 0.0–0.5)
Eosinophils Relative: 0 %
HCT: 43.6 % (ref 39.0–52.0)
Hemoglobin: 14.8 g/dL (ref 13.0–17.0)
Immature Granulocytes: 3 %
Lymphocytes Relative: 9 %
Lymphs Abs: 1.6 10*3/uL (ref 0.7–4.0)
MCH: 34.3 pg — ABNORMAL HIGH (ref 26.0–34.0)
MCHC: 33.9 g/dL (ref 30.0–36.0)
MCV: 101.2 fL — ABNORMAL HIGH (ref 80.0–100.0)
Monocytes Absolute: 2.1 10*3/uL — ABNORMAL HIGH (ref 0.1–1.0)
Monocytes Relative: 11 %
Neutro Abs: 14.1 10*3/uL — ABNORMAL HIGH (ref 1.7–7.7)
Neutrophils Relative %: 76 %
Platelets: 669 10*3/uL — ABNORMAL HIGH (ref 150–400)
RBC: 4.31 MIL/uL (ref 4.22–5.81)
RDW: 16.5 % — ABNORMAL HIGH (ref 11.5–15.5)
WBC: 18.3 10*3/uL — ABNORMAL HIGH (ref 4.0–10.5)
nRBC: 0 % (ref 0.0–0.2)

## 2021-09-12 LAB — PROCALCITONIN: Procalcitonin: 0.11 ng/mL

## 2021-09-12 LAB — COMPREHENSIVE METABOLIC PANEL
ALT: 48 U/L — ABNORMAL HIGH (ref 0–44)
AST: 24 U/L (ref 15–41)
Albumin: 2.9 g/dL — ABNORMAL LOW (ref 3.5–5.0)
Alkaline Phosphatase: 46 U/L (ref 38–126)
Anion gap: 7 (ref 5–15)
BUN: 37 mg/dL — ABNORMAL HIGH (ref 8–23)
CO2: 28 mmol/L (ref 22–32)
Calcium: 8.9 mg/dL (ref 8.9–10.3)
Chloride: 105 mmol/L (ref 98–111)
Creatinine, Ser: 1.44 mg/dL — ABNORMAL HIGH (ref 0.61–1.24)
GFR, Estimated: 54 mL/min — ABNORMAL LOW (ref >60)
Glucose, Bld: 141 mg/dL — ABNORMAL HIGH (ref 70–99)
Potassium: 4.3 mmol/L (ref 3.5–5.1)
Sodium: 140 mmol/L (ref 135–145)
Total Bilirubin: 0.5 mg/dL (ref 0.3–1.2)
Total Protein: 6.1 g/dL — ABNORMAL LOW (ref 6.5–8.1)

## 2021-09-12 LAB — PHOSPHORUS: Phosphorus: 4.6 mg/dL (ref 2.5–4.6)

## 2021-09-12 LAB — D-DIMER, QUANTITATIVE: D-Dimer, Quant: 1.68 ug/mL-FEU — ABNORMAL HIGH (ref 0.00–0.50)

## 2021-09-12 LAB — MAGNESIUM: Magnesium: 2.3 mg/dL (ref 1.7–2.4)

## 2021-09-12 MED ORDER — INFLUENZA VAC A&B SA ADJ QUAD 0.5 ML IM PRSY
0.5000 mL | PREFILLED_SYRINGE | INTRAMUSCULAR | Status: AC
  Administered 2021-09-13: 13:00:00 0.5 mL via INTRAMUSCULAR
  Filled 2021-09-12: qty 0.5

## 2021-09-12 MED ORDER — TECHNETIUM TO 99M ALBUMIN AGGREGATED
4.0000 | Freq: Once | INTRAVENOUS | Status: AC | PRN
  Administered 2021-09-12: 13:00:00 4.35 via INTRAVENOUS

## 2021-09-12 NOTE — Progress Notes (Signed)
PROGRESS NOTE    Dylan Lee  MEQ:[id number] DOB: 26-Jul-1956 DOA: 09/07/2021 PCP: Birdie Sons, MD   Brief Narrative: Taken from prior notes. Dylan Lee is a 65 y.o. male with a PMH significant for nonischemic cardiomyopathy s/p AICD, CAD, HTN, CKD 3a, COPD, rheumatoid arthritis on methotrexate. They presented from home to the ED on 09/07/2021 with cough, SOB x7 days. In the ED, it was found that they had COVID. They were treated with supplemental oxygen, albuterol, steroids, remdesivir.  Admitted for COVID-19 pneumonia with new oxygen requirement.  12/26: Patient remains on oxygen, some worsening of D-dimer, unable to obtain CTA due to CKD, VQ scan ordered.  12/27: VQ scan came back negative for PE. Wife wants him to come home tomorrow morning.  Subjective: Patient was seen and examined today.  He was feeling little more fatigued after having all that testing.  Assessment & Plan:   Principal Problem:   Pneumonia due to COVID-19 virus Active Problems:   Chronic systolic heart failure (HCC)   Rheumatoid arthritis (HCC)   OSA on CPAP   Depressive disorder   COPD (chronic obstructive pulmonary disease) (HCC)   Cardiac defibrillator in situ   CAD (coronary artery disease)   Encephalopathy due to COVID-19 virus   Hypoxia   Stage 3a chronic kidney disease ([place])   Hypotension  Acute hypoxic respiratory failure secondary to COVID-19 pneumonia.  Elevated inflammatory markers.  No baseline oxygen use.PCT elevated at 0.58, patient developed leukocytosis which can be secondary to steroid but based on elevated procalcitonin we will start antibiotics with ceftriaxone and Zithromax for possible superadded bacterial infection.  MRSA swab negative VQ scan done today was negative for PE. -Continue ceftriaxone and Zithromax-we will complete total of 5 days -Completed the course of remdesivir. -Continue with steroid -Continue with supplemental oxygen-wean as tolerated -Continue  with supportive care and supplements  Chronic HFrEF.  EF of 30 to 35% in 2016.  S/p AICD.  Appears euvolemic. Initially home dose of Entresto, diuretic and metoprolol was held due to softer blood pressure, we started Entresto on 09/09/2021 -Continue home dose of Entresto -Keep holding diuretics and metoprolol-we will add as needed -Monitor volume status  History of CAD.  No chest pain. -Continue home dose of aspirin and statin  CKD stage IIIa.  Creatinine seems to be around baseline. -Monitor renal function -Avoid nephrotoxins  COPD.  Not on home oxygen -Continue as needed bronchodilators  Rheumatoid arthritis.  On weekly methotrexate at home -Currently holding-can resume on discharge  History of depression. -Continue home dose of venlafaxine  Obstructive sleep apnea. -Continue CPAP at night  Objective: Vitals:   09/11/21 1746 09/11/21 2101 09/12/21 0459 09/12/21 1517  BP: 116/87 (!) 130/93 (!) 153/96 90/61  Pulse: 88 87 95 (!) 52  Resp: (!) 22 20 16    Temp: 97.8 F (36.6 C) 97.9 F (36.6 C) 98.5 F (36.9 C) 97.7 F (36.5 C)  TempSrc: Oral Oral Oral Oral  SpO2: 95% 96% 92%   Weight:      Height:        Intake/Output Summary (Last 24 hours) at 09/12/2021 1628 Last data filed at 09/11/2021 2001 Gross per 24 hour  Intake 200 ml  Output --  Net 200 ml    Filed Weights   09/07/21 1655  Weight: 104.3 kg    Examination:  General.  Well-developed gentleman, in no acute distress. Pulmonary.  Lungs clear bilaterally, normal respiratory effort. CV.  Regular rate and rhythm, no JVD,  rub or murmur. Abdomen.  Soft, nontender, nondistended, BS positive. CNS.  Alert and oriented x3.  No focal neurologic deficit. Extremities.  No edema, no cyanosis, pulses intact and symmetrical. Psychiatry.  Judgment and insight appears normal.   DVT prophylaxis: Lovenox Code Status: Full Family Communication: Talked with wife on phone. Disposition Plan:  Status is:  Inpatient  Remains inpatient appropriate because: Severity of illness   Level of care: Med-Surg  All the records are reviewed and case discussed with Care Management/Social Worker. Management plans discussed with the patient, nursing and they are in agreement.  Consultants:  None  Procedures:  Antimicrobials:  Ceftriaxone Zithromax  Data Reviewed: I have personally reviewed following labs and imaging studies  CBC: Recent Labs  Lab 09/08/21 0553 09/09/21 0431 09/10/21 0619 09/11/21 0635 09/12/21 0607  WBC 5.8 9.7 10.7* 13.7* 18.3*  NEUTROABS 4.5 7.4 7.0 9.5* 14.1*  HGB 13.5 14.7 14.2 14.2 14.8  HCT 39.1 40.3 41.3 42.0 43.6  MCV 98.0 100.0 99.3 100.5* 101.2*  PLT 447* 620* 631* 649* 669*    Basic Metabolic Panel: Recent Labs  Lab 09/08/21 0553 09/09/21 0431 09/10/21 0619 09/11/21 0635 09/12/21 0607  NA 134* 139 138 140 140  K 3.2* 3.9 3.8 4.3 4.3  CL 99 104 104 108 105  CO2 26 26 28 27 28   GLUCOSE 206* 187* 130* 121* 141*  BUN 27* 36* 40* 38* 37*  CREATININE 1.57* 1.43* 1.44* 1.53* 1.44*  CALCIUM 8.5* 9.4 9.2 8.8* 8.9  MG 2.3 2.3 2.4 2.4 2.3  PHOS 4.7* 3.7 4.4 4.2 4.6    GFR: Estimated Creatinine Clearance: 62 mL/min (A) (by C-G formula based on SCr of 1.44 mg/dL (H)). Liver Function Tests: Recent Labs  Lab 09/08/21 0553 09/09/21 0431 09/10/21 0619 09/11/21 0635 09/12/21 0607  AST 62* 41 25 34 24  ALT 68* 64* 48* 47* 48*  ALKPHOS 43 47 41 45 46  BILITOT 1.2 0.8 0.7 0.6 0.5  PROT 7.1 6.7 6.9 6.2* 6.1*  ALBUMIN 2.9* 2.8* 2.7* 2.7* 2.9*    No results for input(s): LIPASE, AMYLASE in the last 168 hours. No results for input(s): AMMONIA in the last 168 hours. Coagulation Profile: Recent Labs  Lab 09/07/21 1706  INR 1.1    Cardiac Enzymes: No results for input(s): CKTOTAL, CKMB, CKMBINDEX, TROPONINI in the last 168 hours. BNP (last 3 results) No results for input(s): PROBNP in the last 8760 hours. HbA1C: No results for input(s): HGBA1C  in the last 72 hours. CBG: No results for input(s): GLUCAP in the last 168 hours. Lipid Profile: No results for input(s): CHOL, HDL, LDLCALC, TRIG, CHOLHDL, LDLDIRECT in the last 72 hours. Thyroid Function Tests: No results for input(s): TSH, T4TOTAL, FREET4, T3FREE, THYROIDAB in the last 72 hours. Anemia Panel: No results for input(s): VITAMINB12, FOLATE, FERRITIN, TIBC, IRON, RETICCTPCT in the last 72 hours. Sepsis Labs: Recent Labs  Lab 09/09/21 0431 09/12/21 0607  PROCALCITON 0.58 0.11     Recent Results (from the past 240 hour(s))  Resp Panel by RT-PCR (Flu A&B, Covid) Nasopharyngeal Swab     Status: Abnormal   Collection Time: 09/07/21  5:06 PM   Specimen: Nasopharyngeal Swab; Nasopharyngeal(NP) swabs in vial transport medium  Result Value Ref Range Status   SARS Coronavirus 2 by RT PCR POSITIVE (A) NEGATIVE Final    Comment: (NOTE) SARS-CoV-2 target nucleic acids are DETECTED.  The SARS-CoV-2 RNA is generally detectable in upper respiratory specimens during the acute phase of infection. Positive results are indicative  of the presence of the identified virus, but do not rule out bacterial infection or co-infection with other pathogens not detected by the test. Clinical correlation with patient history and other diagnostic information is necessary to determine patient infection status. The expected result is Negative.  Fact Sheet for Patients: EntrepreneurPulse.com.au  Fact Sheet for Healthcare Providers: IncredibleEmployment.be  This test is not yet approved or cleared by the [place] FDA and  has been authorized for detection and/or diagnosis of SARS-CoV-2 by FDA under an Emergency Use Authorization (EUA).  This EUA will remain in effect (meaning this test can be used) for the duration of  the COVID-19 declaration under Section 564(b)(1) of the A ct, 21 U.S.C. section 360bbb-3(b)(1), unless the authorization is terminated  or revoked sooner.     Influenza A by PCR NEGATIVE NEGATIVE Final   Influenza B by PCR NEGATIVE NEGATIVE Final    Comment: (NOTE) The Xpert Xpress SARS-CoV-2/FLU/RSV plus assay is intended as an aid in the diagnosis of influenza from Nasopharyngeal swab specimens and should not be used as a sole basis for treatment. Nasal washings and aspirates are unacceptable for Xpert Xpress SARS-CoV-2/FLU/RSV testing.  Fact Sheet for Patients: EntrepreneurPulse.com.au  Fact Sheet for Healthcare Providers: IncredibleEmployment.be  This test is not yet approved or cleared by the [place] FDA and has been authorized for detection and/or diagnosis of SARS-CoV-2 by FDA under an Emergency Use Authorization (EUA). This EUA will remain in effect (meaning this test can be used) for the duration of the COVID-19 declaration under Section 564(b)(1) of the Act, 21 U.S.C. section 360bbb-3(b)(1), unless the authorization is terminated or revoked.  Performed at [facility], [address] [place], [place] [address]   MRSA Next Gen by PCR, Nasal     Status: None   Collection Time: 09/10/21  2:00 PM   Specimen: Nasal Mucosa; Nasal Swab  Result Value Ref Range Status   MRSA by PCR Next Gen NOT DETECTED NOT DETECTED Final    Comment: (NOTE) The GeneXpert MRSA Assay (FDA approved for NASAL specimens only), is one component of a comprehensive MRSA colonization surveillance program. It is not intended to diagnose MRSA infection nor to guide or monitor treatment for MRSA infections. Test performance is not FDA approved in patients less than 81 years old. Performed at [facility], [address] [place], [place] [address]       Radiology Studies: DG Chest 2 View  Result Date: 09/12/2021 CLINICAL DATA:  66 year old male with a history of cough and congestion EXAM: CHEST - 2 VIEW COMPARISON:  09/07/2021 FINDINGS: Cardiomediastinal silhouette  unchanged in size and contour. Low lung volumes persist.  Coarsened interstitial markings. Lateral view demonstrates increased reticulonodular opacities at the posterior lung base with increased bronchial wall thickening. No interlobular septal thickening. No pneumothorax. No pleural effusion. No confluent airspace disease. Unchanged pacing device/AICD on the right chest wall. No displaced fracture. Degenerative changes of the spine IMPRESSION: Increasing reticulonodular opacities at the posterior lung base with decreased lung volumes may represent bronchitis/bronchopneumonia or alternatively atelectasis/scarring. Electronically Signed   By: Corrie Mckusick D.O.   On: 09/12/2021 09:06   NM Pulmonary Perfusion  Result Date: 09/12/2021 CLINICAL DATA:  COVID positive. Cough. Pulmonary embolism suspected, positive D-dimer. EXAM: NUCLEAR MEDICINE PERFUSION LUNG SCAN TECHNIQUE: Perfusion images were obtained in multiple projections after intravenous injection of radiopharmaceutical. Ventilation scans intentionally deferred if perfusion scan and chest x-ray adequate for interpretation during COVID 19 epidemic. RADIOPHARMACEUTICALS:  4.35 mCi Tc-9m MAA  IV COMPARISON:  Chest x-ray dated 09/12/2021. FINDINGS: No segmental or lobar perfusion defects are identified within either lung. IMPRESSION: No evidence of pulmonary embolism. Electronically Signed   By: Franki Cabot M.D.   On: 09/12/2021 14:02    Scheduled Meds:  albuterol  2 puff Inhalation Q6H   vitamin C  500 mg Oral Daily   aspirin  81 mg Oral Daily   azithromycin  500 mg Oral Daily   enoxaparin (LOVENOX) injection  0.5 mg/kg Subcutaneous Q24H   fluticasone furoate-vilanterol  1 puff Inhalation Daily   folic acid  1 mg Oral Daily   ipratropium  2 puff Inhalation Q8H   predniSONE  50 mg Oral Daily   rosuvastatin  40 mg Oral Daily   sacubitril-valsartan  1 tablet Oral BID   tamsulosin  0.4 mg Oral Daily   venlafaxine XR  150 mg Oral Q breakfast    zinc sulfate  220 mg Oral Daily   Continuous Infusions:  cefTRIAXone (ROCEPHIN)  IV 2 g (09/12/21 1317)     LOS: 5 days   Time spent: 38 minutes. More than 50% of the time was spent in counseling/coordination of care  Lorella Nimrod, MD [facility]  If 7PM-7AM, please contact night-coverage [email or web address]  09/12/2021, 4:28 PM   This record has been created using Systems analyst. Errors have been sought and corrected,but may not always be located. Such creation errors do not reflect on the standard of care.

## 2021-09-13 DIAGNOSIS — U071 COVID-19: Secondary | ICD-10-CM | POA: Diagnosis not present

## 2021-09-13 DIAGNOSIS — J1282 Pneumonia due to coronavirus disease 2019: Secondary | ICD-10-CM | POA: Diagnosis not present

## 2021-09-13 MED ORDER — ASCORBIC ACID 500 MG PO TABS: 500.0000 mg | ORAL_TABLET | Freq: Every day | ORAL | 1 refills | Status: DC

## 2021-09-13 MED ORDER — GUAIFENESIN-DM 100-10 MG/5ML PO SYRP: 10.0000 mL | ORAL_SOLUTION | ORAL | 0 refills | Status: DC | PRN

## 2021-09-13 MED ORDER — PREDNISONE 50 MG PO TABS: 50.0000 mg | ORAL_TABLET | Freq: Every day | ORAL | 0 refills | Status: AC

## 2021-09-13 MED ORDER — IPRATROPIUM BROMIDE HFA 17 MCG/ACT IN AERS: 2.0000 | INHALATION_SPRAY | Freq: Three times a day (TID) | RESPIRATORY_TRACT | 12 refills | Status: AC

## 2021-09-13 MED ORDER — METOPROLOL SUCCINATE ER 25 MG PO TB24: 25.0000 mg | ORAL_TABLET | Freq: Every day | ORAL | 1 refills | Status: DC

## 2021-09-13 MED ORDER — ZINC SULFATE 220 (50 ZN) MG PO CAPS: 220.0000 mg | ORAL_CAPSULE | Freq: Every day | ORAL | 1 refills | Status: DC

## 2021-09-13 NOTE — Care Management Important Message (Signed)
Important Message  Patient Details  Name: Dylan Lee MRN: [id number] Date of Birth: [birth year]   Medicare Important Message Given:  Yes - Important Message mailed due to current [facility]     Dannette Barbara 09/13/2021, 11:33 AM

## 2021-09-13 NOTE — Discharge Summary (Signed)
Physician Discharge Summary  Dylan Lee HRC:[id number] DOB: [birth year] DOA: 09/07/2021  PCP: Dylan Sons, MD  Admit date: 09/07/2021 Discharge date: 09/13/2021  Admitted From: Home Disposition: Home  Recommendations for Outpatient Follow-up:  Follow up with PCP in 1-2 weeks Please obtain BMP/CBC in one week Please follow up on the following pending results: None  Home Health: Yes Equipment/Devices: Rolling walker Discharge Condition: Stable CODE STATUS: Full Diet recommendation: Heart Healthy   Brief/Interim Summary: Dylan Lee is a 65 y.o. male with a PMH significant for nonischemic cardiomyopathy s/p AICD, CAD, HTN, CKD 3a, COPD, rheumatoid arthritis on methotrexate. They presented from home to the ED on 09/07/2021 with cough, SOB x7 days. In the ED, it was found that they had COVID. They were treated with supplemental oxygen, albuterol, steroids, remdesivir.  Admitted for COVID-19 pneumonia with new oxygen requirement. Patient completed a 5-day course of remdesivir.  VQ scan was negative for PE.  We were able to wean him off from oxygen. Patient also received 5 days of antibiotics with ceftriaxone and Zithromax due to procalcitonin being elevated at 0.58 and concern of superadded bacterial infection.  Completed the course.  He was discharged home on 5 more days of prednisone and supplements.  Patient has an history of HFrEF s/p AICD.  He appears euvolemic.  Initially her home medications which include Entresto, diuretic and metoprolol was held due to softer blood pressure.  We resume Entresto while in the hospital and he can resume rest of his home medications on discharge.  He can follow-up with his cardiologist for further recommendations.  Patient has an history of CKD stage IIIa.  Creatinine appears around baseline and remained stable.  Patient will continue with rest of his home medications and follow-up with his providers.  Discharge Diagnoses:  Principal  Problem:   Pneumonia due to COVID-19 virus Active Problems:   Chronic systolic heart failure (HCC)   Rheumatoid arthritis (HCC)   OSA on CPAP   Depressive disorder   COPD (chronic obstructive pulmonary disease) (HCC)   Cardiac defibrillator in situ   CAD (coronary artery disease)   Encephalopathy due to COVID-19 virus   Hypoxia   Stage 3a chronic kidney disease (HCC)   Hypotension   Discharge Instructions  Discharge Instructions     Diet - low sodium heart healthy   Complete by: As directed    Discharge instructions   Complete by: As directed    It was pleasure taking care of you. You are being given prednisone for 5 more days, please take it as directed. We also decreased the dose of Toprol to 25 mg daily as we were holding it here and your heart rate remained stable. Continue the rest of your home medications and follow-up with your doctors.   Increase activity slowly   Complete by: As directed       Allergies as of 09/13/2021   No Known Allergies      Medication List     STOP taking these medications    ketoconazole 2 % cream Commonly known as: NIZORAL   spironolactone 25 MG tablet Commonly known as: ALDACTONE   triamcinolone cream 0.1 % Commonly known as: KENALOG       TAKE these medications    albuterol (2.5 MG/3ML) 0.083% nebulizer solution Commonly known as: PROVENTIL Inhale 3 mLs into the lungs every 6 (six) hours as needed for wheezing.   ascorbic acid 500 MG tablet Commonly known as: VITAMIN C Take 1 tablet (500  mg total) by mouth daily.   aspirin 81 MG tablet Take 81 mg by mouth daily.   Entresto 97-103 MG Generic drug: sacubitril-valsartan Take 1 tablet by mouth 2 (two) times daily.   folic acid 1 MG tablet Commonly known as: FOLVITE Take 1 mg by mouth daily.   guaiFENesin-dextromethorphan 100-10 MG/5ML syrup Commonly known as: ROBITUSSIN DM Take 10 mLs by mouth every 4 (four) hours as needed for cough.   ipratropium 17  MCG/ACT inhaler Commonly known as: ATROVENT HFA Inhale 2 puffs into the lungs every 8 (eight) hours.   methotrexate 50 MG/2ML injection INJECT 0.6 ML INTO THE SKIN ONCE A WEEK   metoprolol succinate 25 MG 24 hr tablet Commonly known as: TOPROL-XL Take 1 tablet (25 mg total) by mouth daily. What changed: how much to take   predniSONE 50 MG tablet Commonly known as: DELTASONE Take 1 tablet (50 mg total) by mouth daily for 5 days.   rosuvastatin 40 MG tablet Commonly known as: CRESTOR Take 1 tablet (40 mg total) by mouth daily.   Symbicort 160-4.5 MCG/ACT inhaler Generic drug: budesonide-formoterol INHALE 2 PUFFS INTO LUNGS TWICE DAILY   tamsulosin 0.4 MG Caps capsule Commonly known as: FLOMAX Take 1 capsule (0.4 mg total) by mouth daily.   torsemide 20 MG tablet Commonly known as: DEMADEX Take 10 mg by mouth daily.   venlafaxine XR 150 MG 24 hr capsule Commonly known as: EFFEXOR-XR Take 1 capsule (150 mg total) by mouth daily with breakfast. Take in addition to 75mg  tablet every day What changed: additional instructions   zinc sulfate 220 (50 Zn) MG capsule Take 1 capsule (220 mg total) by mouth daily.        Follow-up Information     Dylan Sons, MD. Schedule an appointment as soon as possible for a visit in 1 week(s).   Specialty: Family Medicine Contact information: [address] [place] [address] [phone]                No Known Allergies  Consultations: None  Procedures/Studies: DG Chest 2 View  Result Date: 09/12/2021 CLINICAL DATA:  65 year old male with a history of cough and congestion EXAM: CHEST - 2 VIEW COMPARISON:  09/07/2021 FINDINGS: Cardiomediastinal silhouette unchanged in size and contour. Low lung volumes persist.  Coarsened interstitial markings. Lateral view demonstrates increased reticulonodular opacities at the posterior lung base with increased bronchial wall thickening. No interlobular septal  thickening. No pneumothorax. No pleural effusion. No confluent airspace disease. Unchanged pacing device/AICD on the right chest wall. No displaced fracture. Degenerative changes of the spine IMPRESSION: Increasing reticulonodular opacities at the posterior lung base with decreased lung volumes may represent bronchitis/bronchopneumonia or alternatively atelectasis/scarring. Electronically Signed   By: Corrie Mckusick D.O.   On: 09/12/2021 09:06   DG Chest 2 View  Result Date: 09/07/2021 CLINICAL DATA:  Chest pain, cough EXAM: CHEST - 2 VIEW COMPARISON:  11/08/2017 FINDINGS: Mild subpleural patchy opacities in the lower lungs. No pleural effusion or pneumothorax. Heart is normal in size.  Right subclavian ICD. Mild degenerative changes of the visualized thoracolumbar spine. IMPRESSION: Mild subpleural patchy opacities in the lower lungs, favoring subpleural scarring, although mild atypical infection is possible. This is new from 2019. Electronically Signed   By: Julian Hy M.D.   On: 09/07/2021 17:58   NM Pulmonary Perfusion  Result Date: 09/12/2021 CLINICAL DATA:  COVID positive. Cough. Pulmonary embolism suspected, positive D-dimer. EXAM: NUCLEAR MEDICINE PERFUSION LUNG SCAN TECHNIQUE: Perfusion images  were obtained in multiple projections after intravenous injection of radiopharmaceutical. Ventilation scans intentionally deferred if perfusion scan and chest x-ray adequate for interpretation during COVID 19 epidemic. RADIOPHARMACEUTICALS:  4.35 mCi Tc-26m MAA IV COMPARISON:  Chest x-ray dated 09/12/2021. FINDINGS: No segmental or lobar perfusion defects are identified within either lung. IMPRESSION: No evidence of pulmonary embolism. Electronically Signed   By: Franki Cabot M.D.   On: 09/12/2021 14:02    Subjective: Patient was seen and examined today.  He thinks he has been improved and feeling much better.  Ready to go home.  Discharge Exam: Vitals:   09/13/21 0522 09/13/21 0919  BP:  129/88 130/77  Pulse: 78 69  Resp: 13 18  Temp: 97.9 F (36.6 C) 98.3 F (36.8 C)  SpO2: 99% 95%   Vitals:   09/12/21 1517 09/12/21 2029 09/13/21 0522 09/13/21 0919  BP: 90/61 109/76 129/88 130/77  Pulse: (!) 52 89 78 69  Resp:  16 13 18   Temp: 97.7 F (36.5 C) 98.7 F (37.1 C) 97.9 F (36.6 C) 98.3 F (36.8 C)  TempSrc: Oral Oral Oral Oral  SpO2:  96% 99% 95%  Weight:      Height:        General: Pt is alert, awake, not in acute distress Cardiovascular: RRR, S1/S2 +, no rubs, no gallops Respiratory: CTA bilaterally, no wheezing, no rhonchi Abdominal: Soft, NT, ND, bowel sounds + Extremities: no edema, no cyanosis   The results of significant diagnostics from this hospitalization (including imaging, microbiology, ancillary and laboratory) are listed below for reference.    Microbiology: Recent Results (from the past 240 hour(s))  Resp Panel by RT-PCR (Flu A&B, Covid) Nasopharyngeal Swab     Status: Abnormal   Collection Time: 09/07/21  5:06 PM   Specimen: Nasopharyngeal Swab; Nasopharyngeal(NP) swabs in vial transport medium  Result Value Ref Range Status   SARS Coronavirus 2 by RT PCR POSITIVE (A) NEGATIVE Final    Comment: (NOTE) SARS-CoV-2 target nucleic acids are DETECTED.  The SARS-CoV-2 RNA is generally detectable in upper respiratory specimens during the acute phase of infection. Positive results are indicative of the presence of the identified virus, but do not rule out bacterial infection or co-infection with other pathogens not detected by the test. Clinical correlation with patient history and other diagnostic information is necessary to determine patient infection status. The expected result is Negative.  Fact Sheet for Patients: EntrepreneurPulse.com.au  Fact Sheet for Healthcare Providers: IncredibleEmployment.be  This test is not yet approved or cleared by the [place] FDA and  has been authorized for  detection and/or diagnosis of SARS-CoV-2 by FDA under an Emergency Use Authorization (EUA).  This EUA will remain in effect (meaning this test can be used) for the duration of  the COVID-19 declaration under Section 564(b)(1) of the A ct, 21 U.S.C. section 360bbb-3(b)(1), unless the authorization is terminated or revoked sooner.     Influenza A by PCR NEGATIVE NEGATIVE Final   Influenza B by PCR NEGATIVE NEGATIVE Final    Comment: (NOTE) The Xpert Xpress SARS-CoV-2/FLU/RSV plus assay is intended as an aid in the diagnosis of influenza from Nasopharyngeal swab specimens and should not be used as a sole basis for treatment. Nasal washings and aspirates are unacceptable for Xpert Xpress SARS-CoV-2/FLU/RSV testing.  Fact Sheet for Patients: EntrepreneurPulse.com.au  Fact Sheet for Healthcare Providers: IncredibleEmployment.be  This test is not yet approved or cleared by the [place] FDA and has been authorized for detection and/or diagnosis of  SARS-CoV-2 by FDA under an Emergency Use Authorization (EUA). This EUA will remain in effect (meaning this test can be used) for the duration of the COVID-19 declaration under Section 564(b)(1) of the Act, 21 U.S.C. section 360bbb-3(b)(1), unless the authorization is terminated or revoked.  Performed at [facility], [address] [place], [place] [address]   MRSA Next Gen by PCR, Nasal     Status: None   Collection Time: 09/10/21  2:00 PM   Specimen: Nasal Mucosa; Nasal Swab  Result Value Ref Range Status   MRSA by PCR Next Gen NOT DETECTED NOT DETECTED Final    Comment: (NOTE) The GeneXpert MRSA Assay (FDA approved for NASAL specimens only), is one component of a comprehensive MRSA colonization surveillance program. It is not intended to diagnose MRSA infection nor to guide or monitor treatment for MRSA infections. Test performance is not FDA approved in patients less than 58  years old. Performed at [facility], [address] [place], [place] [address]      Labs: BNP (last 3 results) Recent Labs    12/13/20 1111  BNP 75.9   Basic Metabolic Panel: Recent Labs  Lab 09/08/21 0553 09/09/21 0431 09/10/21 0619 09/11/21 0635 09/12/21 0607  NA 134* 139 138 140 140  K 3.2* 3.9 3.8 4.3 4.3  CL 99 104 104 108 105  CO2 26 26 28 27 28   GLUCOSE 206* 187* 130* 121* 141*  BUN 27* 36* 40* 38* 37*  CREATININE 1.57* 1.43* 1.44* 1.53* 1.44*  CALCIUM 8.5* 9.4 9.2 8.8* 8.9  MG 2.3 2.3 2.4 2.4 2.3  PHOS 4.7* 3.7 4.4 4.2 4.6   Liver Function Tests: Recent Labs  Lab 09/08/21 0553 09/09/21 0431 09/10/21 0619 09/11/21 0635 09/12/21 0607  AST 62* 41 25 34 24  ALT 68* 64* 48* 47* 48*  ALKPHOS 43 47 41 45 46  BILITOT 1.2 0.8 0.7 0.6 0.5  PROT 7.1 6.7 6.9 6.2* 6.1*  ALBUMIN 2.9* 2.8* 2.7* 2.7* 2.9*   No results for input(s): LIPASE, AMYLASE in the last 168 hours. No results for input(s): AMMONIA in the last 168 hours. CBC: Recent Labs  Lab 09/08/21 0553 09/09/21 0431 09/10/21 0619 09/11/21 0635 09/12/21 0607  WBC 5.8 9.7 10.7* 13.7* 18.3*  NEUTROABS 4.5 7.4 7.0 9.5* 14.1*  HGB 13.5 14.7 14.2 14.2 14.8  HCT 39.1 40.3 41.3 42.0 43.6  MCV 98.0 100.0 99.3 100.5* 101.2*  PLT 447* 620* 631* 649* 669*   Cardiac Enzymes: No results for input(s): CKTOTAL, CKMB, CKMBINDEX, TROPONINI in the last 168 hours. BNP: Invalid input(s): POCBNP CBG: No results for input(s): GLUCAP in the last 168 hours. D-Dimer Recent Labs    09/11/21 0635 09/12/21 0607  DDIMER 1.49* 1.68*   Hgb A1c No results for input(s): HGBA1C in the last 72 hours. Lipid Profile No results for input(s): CHOL, HDL, LDLCALC, TRIG, CHOLHDL, LDLDIRECT in the last 72 hours. Thyroid function studies No results for input(s): TSH, T4TOTAL, T3FREE, THYROIDAB in the last 72 hours.  Invalid input(s): FREET3 Anemia work up No results for input(s): VITAMINB12, FOLATE, FERRITIN,  TIBC, IRON, RETICCTPCT in the last 72 hours. Urinalysis    Component Value Date/Time   COLORURINE STRAW (A) 05/25/2015 1014   APPEARANCEUR CLEAR (A) 05/25/2015 1014   LABSPEC 1.009 05/25/2015 1014   PHURINE 6.0 05/25/2015 1014   GLUCOSEU NEGATIVE 05/25/2015 1014   HGBUR NEGATIVE 05/25/2015 1014   BILIRUBINUR negative 04/03/2021 1343   KETONESUR NEGATIVE 05/25/2015 1014   PROTEINUR Negative  04/03/2021 1343   PROTEINUR NEGATIVE 05/25/2015 1014   UROBILINOGEN 0.2 04/03/2021 1343   NITRITE negative 04/03/2021 1343   NITRITE NEGATIVE 05/25/2015 1014   LEUKOCYTESUR Negative 04/03/2021 1343   Sepsis Labs Invalid input(s): PROCALCITONIN,  WBC,  LACTICIDVEN Microbiology Recent Results (from the past 240 hour(s))  Resp Panel by RT-PCR (Flu A&B, Covid) Nasopharyngeal Swab     Status: Abnormal   Collection Time: 09/07/21  5:06 PM   Specimen: Nasopharyngeal Swab; Nasopharyngeal(NP) swabs in vial transport medium  Result Value Ref Range Status   SARS Coronavirus 2 by RT PCR POSITIVE (A) NEGATIVE Final    Comment: (NOTE) SARS-CoV-2 target nucleic acids are DETECTED.  The SARS-CoV-2 RNA is generally detectable in upper respiratory specimens during the acute phase of infection. Positive results are indicative of the presence of the identified virus, but do not rule out bacterial infection or co-infection with other pathogens not detected by the test. Clinical correlation with patient history and other diagnostic information is necessary to determine patient infection status. The expected result is Negative.  Fact Sheet for Patients: EntrepreneurPulse.com.au  Fact Sheet for Healthcare Providers: IncredibleEmployment.be  This test is not yet approved or cleared by the [place] FDA and  has been authorized for detection and/or diagnosis of SARS-CoV-2 by FDA under an Emergency Use Authorization (EUA).  This EUA will remain in effect (meaning this  test can be used) for the duration of  the COVID-19 declaration under Section 564(b)(1) of the A ct, 21 U.S.C. section 360bbb-3(b)(1), unless the authorization is terminated or revoked sooner.     Influenza A by PCR NEGATIVE NEGATIVE Final   Influenza B by PCR NEGATIVE NEGATIVE Final    Comment: (NOTE) The Xpert Xpress SARS-CoV-2/FLU/RSV plus assay is intended as an aid in the diagnosis of influenza from Nasopharyngeal swab specimens and should not be used as a sole basis for treatment. Nasal washings and aspirates are unacceptable for Xpert Xpress SARS-CoV-2/FLU/RSV testing.  Fact Sheet for Patients: EntrepreneurPulse.com.au  Fact Sheet for Healthcare Providers: IncredibleEmployment.be  This test is not yet approved or cleared by the [place] FDA and has been authorized for detection and/or diagnosis of SARS-CoV-2 by FDA under an Emergency Use Authorization (EUA). This EUA will remain in effect (meaning this test can be used) for the duration of the COVID-19 declaration under Section 564(b)(1) of the Act, 21 U.S.C. section 360bbb-3(b)(1), unless the authorization is terminated or revoked.  Performed at [facility], [address] [place], [place] [address]   MRSA Next Gen by PCR, Nasal     Status: None   Collection Time: 09/10/21  2:00 PM   Specimen: Nasal Mucosa; Nasal Swab  Result Value Ref Range Status   MRSA by PCR Next Gen NOT DETECTED NOT DETECTED Final    Comment: (NOTE) The GeneXpert MRSA Assay (FDA approved for NASAL specimens only), is one component of a comprehensive MRSA colonization surveillance program. It is not intended to diagnose MRSA infection nor to guide or monitor treatment for MRSA infections. Test performance is not FDA approved in patients less than 96 years old. Performed at [facility], [address] [place], [place] [address]     Time coordinating discharge: Over 30  minutes  SIGNED:  Lorella Nimrod, MD  [facility] 09/13/2021, 12:25 PM  If 7PM-7AM, please contact night-coverage [email or web address]  This record has been created using Systems analyst. Errors have been sought and corrected,but may not always be located. Such creation errors do not  reflect on the standard of care.

## 2021-09-13 NOTE — Progress Notes (Signed)
Pt discharged per MD order. IV removed. Discharge instructions reviewed with pt. Pt verbalized understanding and all questions answered to pt satisfaction. Pt taken downstairs to car in wheelchair via staff.

## 2021-09-13 NOTE — Evaluation (Signed)
Physical Therapy Evaluation Patient Details Name: Dylan Lee MRN: [id number] DOB: 11-19-1955 Today's Date: 09/13/2021  History of Present Illness  Pt is a 65 y.o. male with a PMH significant for nonischemic cardiomyopathy s/p AICD, CAD, HTN, CKD 3a, COPD, CHF, rheumatoid arthritis on methotrexate. Pt presented from home to the ED on 09/07/2021 with cough and SOB x7 days.  Pt diagnosed with acute hypoxic respiratory failure secondary to COVID-19 pneumonia.   Clinical Impression  Pt was pleasant and motivated to participate during the session and put forth good effort throughout. O2 qualification performed per nursing request.  SpO2 on room air at rest 94% and remained 93-94% throughout the session including during bed mobility and gait.  Pt left on room air at end of session with nsg notified.  Pt generally deconditioned with limited activity tolerance compared to baseline and will benefit from HHPT upon discharge to safely address deficits listed in patient problem list for decreased caregiver assistance and eventual return to PLOF.         Recommendations for follow up therapy are one component of a multi-disciplinary discharge planning process, led by the attending physician.  Recommendations may be updated based on patient status, additional functional criteria and insurance authorization.  Follow Up Recommendations Home health PT    Assistance Recommended at Discharge Intermittent Supervision/Assistance  Functional Status Assessment Patient has had a recent decline in their functional status and demonstrates the ability to make significant improvements in function in a reasonable and predictable amount of time.  Equipment Recommendations  None recommended by PT    Recommendations for Other Services       Precautions / Restrictions Precautions Precautions: Fall Restrictions Weight Bearing Restrictions: No      Mobility  Bed Mobility Overal bed mobility: Modified  Independent             General bed mobility comments: Extra time and effort    Transfers Overall transfer level: Needs assistance Equipment used: Rolling walker (2 wheels) Transfers: Sit to/from Stand Sit to Stand: Supervision           General transfer comment: Fair eccentric control and stability    Ambulation/Gait Ambulation/Gait assistance: Supervision Gait Distance (Feet): 30 Feet Assistive device: Rolling walker (2 wheels) Gait Pattern/deviations: Step-through pattern;Decreased step length - right;Decreased step length - left Gait velocity: decreased     General Gait Details: Slow cadence with mild trunk flexion but steady without LOB  Stairs            Wheelchair Mobility    Modified Rankin (Stroke Patients Only)       Balance Overall balance assessment: Needs assistance   Sitting balance-Leahy Scale: Good     Standing balance support: Bilateral upper extremity supported;During functional activity Standing balance-Leahy Scale: Fair                               Pertinent Vitals/Pain Pain Assessment: No/denies pain    Home Living Family/patient expects to be discharged to:: Private residence Living Arrangements: Spouse/significant other Available Help at Discharge: Family;Available 24 hours/day Type of Home: House Home Access: Stairs to enter Entrance Stairs-Rails: Left Entrance Stairs-Number of Steps: 3   Home Layout: One level Home Equipment: Conservation officer, nature (2 wheels);Cane - single point;Crutches;Grab bars - tub/shower      Prior Function Prior Level of Function : Independent/Modified Independent             Mobility Comments: Ind  Amb community distances without an AD, no fall history ADLs Comments: Ind with ADLs     Hand Dominance        Extremity/Trunk Assessment   Upper Extremity Assessment Upper Extremity Assessment: Generalized weakness    Lower Extremity Assessment Lower Extremity Assessment:  Generalized weakness       Communication   Communication: No difficulties  Cognition Arousal/Alertness: Awake/alert Behavior During Therapy: WFL for tasks assessed/performed Overall Cognitive Status: Within Functional Limits for tasks assessed                                          General Comments      Exercises Total Joint Exercises Ankle Circles/Pumps: AROM;Strengthening;Both;10 reps Quad Sets: Strengthening;10 reps;Both Long Arc Quad: AROM;Strengthening;Both;10 reps Knee Flexion: AROM;10 reps;Both;Strengthening Other Exercises Other Exercises: Pt education provided on physiological benefits of activity and principles of activity progression   Assessment/Plan    PT Assessment Patient needs continued PT services  PT Problem List Decreased strength;Decreased activity tolerance;Decreased balance;Decreased mobility;Decreased knowledge of use of DME       PT Treatment Interventions DME instruction;Gait training;Stair training;Functional mobility training;Therapeutic activities;Therapeutic exercise;Balance training;Patient/family education    PT Goals (Current goals can be found in the Care Plan section)  Acute Rehab PT Goals Patient Stated Goal: To get strength back PT Goal Formulation: With patient Time For Goal Achievement: 09/26/21 Potential to Achieve Goals: Good    Frequency Min 2X/week   Barriers to discharge        Co-evaluation               AM-PAC PT "6 Clicks" Mobility  Outcome Measure Help needed turning from your back to your side while in a flat bed without using bedrails?: None Help needed moving from lying on your back to sitting on the side of a flat bed without using bedrails?: None Help needed moving to and from a bed to a chair (including a wheelchair)?: A Little Help needed standing up from a chair using your arms (e.g., wheelchair or bedside chair)?: A Little Help needed to walk in hospital room?: A Little Help needed  climbing 3-5 steps with a railing? : A Little 6 Click Score: 20    End of Session Equipment Utilized During Treatment: Gait belt Activity Tolerance: Patient tolerated treatment well Patient left: in chair;with call bell/phone within reach;with chair alarm set Nurse Communication: Mobility status;Other (comment) (O2 93-94% on room air throughout the session with pt left on room air) PT Visit Diagnosis: Muscle weakness (generalized) (M62.81);Difficulty in walking, not elsewhere classified (R26.2)    Time: 6333-5456 PT Time Calculation (min) (ACUTE ONLY): 57 min   Charges:   PT Evaluation $PT Eval Moderate Complexity: 1 Mod PT Treatments $Therapeutic Exercise: 8-22 mins $Therapeutic Activity: 8-22 mins        D. Scott Bowyn Mercier PT, DPT 09/13/21, 12:01 PM

## 2021-09-13 NOTE — Evaluation (Signed)
Occupational Therapy Evaluation Patient Details Name: Dylan Lee MRN: [id number] DOB: 28-Jun-1956 Today's Date: 09/13/2021   History of Present Illness Pt is a 65 y.o. male with a PMH significant for nonischemic cardiomyopathy s/p AICD, CAD, HTN, CKD 3a, COPD, CHF, rheumatoid arthritis on methotrexate. Pt presented from home to the ED on 09/07/2021 with cough and SOB x7 days.  Pt diagnosed with acute hypoxic respiratory failure secondary to COVID-19 pneumonia.   Clinical Impression   Patient presenting with decreased Ind in self care, balance, functional mobility/transfers, endurance, and safety awareness. Patient reports being independent at baseline and living with wife PTA. Patient able to be on RA during session and mobility, self care, and functional transfers attempted without use of AD and pt ambulating at supervision - min guard for balance. Pt reports fatigue during session as well. OT recommended pt use RW for mobility at discharge for Patient will benefit from acute OT to increase overall independence in the areas of ADLs, functional mobility, and safety awareness in order to safely discharge home with family.     Recommendations for follow up therapy are one component of a multi-disciplinary discharge planning process, led by the attending physician.  Recommendations may be updated based on patient status, additional functional criteria and insurance authorization.   Follow Up Recommendations  No OT follow up    Assistance Recommended at Discharge Intermittent Supervision/Assistance  Functional Status Assessment  Patient has had a recent decline in their functional status and demonstrates the ability to make significant improvements in function in a reasonable and predictable amount of time.  Equipment Recommendations  Other (comment) (Pt has RW at home)       Precautions / Restrictions Precautions Precautions: Fall Restrictions Weight Bearing Restrictions: No       Mobility Bed Mobility Overal bed mobility: Modified Independent             General bed mobility comments: Extra time and effort    Transfers Overall transfer level: Needs assistance Equipment used: None;1 person hand held assist Transfers: Sit to/from Stand Sit to Stand: Supervision;Min guard           General transfer comment: Fair eccentric control and stability      Balance Overall balance assessment: Needs assistance   Sitting balance-Leahy Scale: Good     Standing balance support: Bilateral upper extremity supported;During functional activity Standing balance-Leahy Scale: Fair                             ADL either performed or assessed with clinical judgement   ADL Overall ADL's : Needs assistance/impaired     Grooming: Wash/dry hands;Wash/dry face;Standing;Supervision/safety                   Toilet Transfer: Production assistant, radio- Water quality scientist and Hygiene: Min guard;Sit to/from stand       Functional mobility during ADLs: Min guard       Vision Patient Visual Report: No change from baseline              Pertinent Vitals/Pain Pain Assessment: No/denies pain     Hand Dominance Right   Extremity/Trunk Assessment Upper Extremity Assessment Upper Extremity Assessment: Generalized weakness   Lower Extremity Assessment Lower Extremity Assessment: Generalized weakness       Communication Communication Communication: No difficulties   Cognition Arousal/Alertness: Awake/alert Behavior During Therapy: WFL for tasks assessed/performed Overall Cognitive Status: Within Functional Limits for tasks  assessed                                          Exercises Total Joint Exercises Ankle Circles/Pumps: AROM;Strengthening;Both;10 reps Quad Sets: Strengthening;10 reps;Both Long Arc Quad: AROM;Strengthening;Both;10 reps Knee Flexion: AROM;10 reps;Both;Strengthening Other  Exercises Other Exercises: Pt education provided on physiological benefits of activity and principles of activity progression        Home Living Family/patient expects to be discharged to:: Private residence Living Arrangements: Spouse/significant other Available Help at Discharge: Family;Available 24 hours/day Type of Home: House Home Access: Stairs to enter [employer] of Steps: 3 Entrance Stairs-Rails: Left Home Layout: One level     Bathroom Shower/Tub: Teacher, early years/pre: Standard     Home Equipment: Conservation officer, nature (2 wheels);Cane - single point;Crutches;Grab bars - tub/shower          Prior Functioning/Environment Prior Level of Function : Independent/Modified Independent             Mobility Comments: Ind Amb community distances without an AD, no fall history ADLs Comments: Ind with ADLs        OT Problem List: Decreased strength;Decreased activity tolerance;Impaired balance (sitting and/or standing);Decreased safety awareness;Decreased knowledge of use of DME or AE      OT Treatment/Interventions: Self-care/ADL training;Manual therapy;Therapeutic exercise;Modalities;Patient/family education;Balance training;Energy conservation;Therapeutic activities;DME and/or AE instruction    OT Goals(Current goals can be found in the care plan section) Acute Rehab OT Goals Patient Stated Goal: to return home OT Goal Formulation: With patient Time For Goal Achievement: 09/27/21 Potential to Achieve Goals: Fair  OT Frequency: Min 2X/week   Barriers to D/C:    none known at this time          AM-PAC OT "6 Clicks" Daily Activity     Outcome Measure Help from another person eating meals?: None Help from another person taking care of personal grooming?: None Help from another person toileting, which includes using toliet, bedpan, or urinal?: A Little Help from another person bathing (including washing, rinsing, drying)?: A Little Help from  another person to put on and taking off regular upper body clothing?: None Help from another person to put on and taking off regular lower body clothing?: A Little 6 Click Score: 21   End of Session Nurse Communication: Mobility status  Activity Tolerance: Patient tolerated treatment well Patient left: in bed;with call bell/phone within reach;with bed alarm set  OT Visit Diagnosis: Unsteadiness on feet (R26.81);Muscle weakness (generalized) (M62.81)                Time: 0630-1601 OT Time Calculation (min): 26 min Charges:  OT General Charges $OT Visit: 1 Visit OT Evaluation $OT Eval Moderate Complexity: 1 Mod OT Treatments $Self Care/Home Management : 8-22 mins  Darleen Crocker, MS, OTR/L , CBIS ascom [phone]  09/13/21, 12:44 PM

## 2021-09-13 NOTE — TOC Initial Note (Signed)
Transition of Care [facility]) - Initial/Assessment Note    Patient Details  Name: Dylan Lee MRN: [id number] Date of Birth: [birth year]  [facility]) CM/SW Contact:    Beverly Sessions, RN Phone Number: 09/13/2021, 3:15 PM  Clinical Narrative:                  Admitted for: covid Admitted from: home with wife PCP: [facility] Patient weaned to RA prior to discharge Therapy recommending home health.  Patient agreeable, wife prefers [facility].  Referral made and accepted by Malachy Mood with [facility].  Family friend to pick patient up at discharge         Patient Goals and CMS Choice        Expected Discharge Plan and Services           Expected Discharge Date: 09/13/21                                    Prior Living Arrangements/Services                       Activities of Daily Living Home Assistive Devices/Equipment: None ADL Screening (condition at time of admission) Patient's cognitive ability adequate to safely complete daily activities?: Yes Is the patient deaf or have difficulty hearing?: No Does the patient have difficulty seeing, even when wearing glasses/contacts?: Yes Does the patient have difficulty concentrating, remembering, or making decisions?: No Patient able to express need for assistance with ADLs?: Yes Does the patient have difficulty dressing or bathing?: No Independently performs ADLs?: Yes (appropriate for developmental age) Does the patient have difficulty walking or climbing stairs?: Yes Weakness of Legs: Both Weakness of Arms/Hands: None  Permission Sought/Granted                  Emotional Assessment              Admission diagnosis:  Pneumonia due to COVID-19 virus [U07.1, J12.82] COVID-19 [U07.1] Patient Active Problem List   Diagnosis Date Noted   Hypotension 09/08/2021   Encephalopathy due to COVID-19 virus 09/07/2021   Pneumonia due to COVID-19 virus 09/07/2021   Hypoxia 09/07/2021   Stage 3a  chronic kidney disease ([place]) 09/07/2021   History of colon polyps 01/14/2019   Dyslipidemia (high LDL; low HDL) 03/28/2018   CAD (coronary artery disease) 03/27/2018   Cardiac defibrillator in situ 12/06/2015   Urinary incontinence 12/05/2015   Allergic rhinitis 09/06/2015   Kidney stones 09/06/2015   Erectile dysfunction 09/06/2015   DOE (dyspnea on exertion) 09/06/2015   COPD (chronic obstructive pulmonary disease) ([place]) 09/06/2015   Bradycardia 57/84/6962   Chronic systolic heart failure ([place]) 03/02/2015   Congestive dilated cardiomyopathy ([place]) 03/02/2015   Pre-diabetes 01/14/2010   Tinnitus 06/24/2009   Psoriasis 04/15/2007   External hemorrhoids without complication 95/28/4132   OSA on CPAP 04/07/2007   Hepatitis C virus infection without hepatic coma 04/07/2007   Benign neoplasm of large bowel 04/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

## 2021-09-14 ENCOUNTER — Telehealth: Payer: Self-pay

## 2021-09-14 NOTE — Telephone Encounter (Signed)
Transition Care Management Unsuccessful Follow-up Telephone Call  Date of discharge and from where:  09/13/2021 [facility]  Attempts:  1st Attempt  Reason for unsuccessful TCM follow-up call:  Left voice message

## 2021-09-17 DIAGNOSIS — M069 Rheumatoid arthritis, unspecified: Secondary | ICD-10-CM | POA: Diagnosis not present

## 2021-09-17 DIAGNOSIS — U071 COVID-19: Secondary | ICD-10-CM | POA: Diagnosis not present

## 2021-09-17 DIAGNOSIS — N1831 Chronic kidney disease, stage 3a: Secondary | ICD-10-CM | POA: Diagnosis not present

## 2021-09-17 DIAGNOSIS — Z95810 Presence of automatic (implantable) cardiac defibrillator: Secondary | ICD-10-CM | POA: Diagnosis not present

## 2021-09-17 DIAGNOSIS — I5022 Chronic systolic (congestive) heart failure: Secondary | ICD-10-CM | POA: Diagnosis not present

## 2021-09-17 DIAGNOSIS — J1282 Pneumonia due to coronavirus disease 2019: Secondary | ICD-10-CM | POA: Diagnosis not present

## 2021-09-17 DIAGNOSIS — Z7952 Long term (current) use of systemic steroids: Secondary | ICD-10-CM | POA: Diagnosis not present

## 2021-09-17 DIAGNOSIS — J189 Pneumonia, unspecified organism: Secondary | ICD-10-CM | POA: Diagnosis not present

## 2021-09-17 DIAGNOSIS — Z7982 Long term (current) use of aspirin: Secondary | ICD-10-CM | POA: Diagnosis not present

## 2021-09-17 DIAGNOSIS — Z9181 History of falling: Secondary | ICD-10-CM | POA: Diagnosis not present

## 2021-09-17 DIAGNOSIS — R69 Illness, unspecified: Secondary | ICD-10-CM | POA: Diagnosis not present

## 2021-09-17 DIAGNOSIS — I130 Hypertensive heart and chronic kidney disease with heart failure and stage 1 through stage 4 chronic kidney disease, or unspecified chronic kidney disease: Secondary | ICD-10-CM | POA: Diagnosis not present

## 2021-09-17 DIAGNOSIS — G4733 Obstructive sleep apnea (adult) (pediatric): Secondary | ICD-10-CM | POA: Diagnosis not present

## 2021-09-17 DIAGNOSIS — J440 Chronic obstructive pulmonary disease with acute lower respiratory infection: Secondary | ICD-10-CM | POA: Diagnosis not present

## 2021-09-17 DIAGNOSIS — Z9989 Dependence on other enabling machines and devices: Secondary | ICD-10-CM | POA: Diagnosis not present

## 2021-09-17 DIAGNOSIS — I2510 Atherosclerotic heart disease of native coronary artery without angina pectoris: Secondary | ICD-10-CM | POA: Diagnosis not present

## 2021-09-17 DIAGNOSIS — Z7951 Long term (current) use of inhaled steroids: Secondary | ICD-10-CM | POA: Diagnosis not present

## 2021-09-19 ENCOUNTER — Telehealth (INDEPENDENT_AMBULATORY_CARE_PROVIDER_SITE_OTHER): Payer: Medicare HMO | Admitting: Family Medicine

## 2021-09-19 DIAGNOSIS — U071 COVID-19: Secondary | ICD-10-CM

## 2021-09-19 NOTE — Progress Notes (Signed)
MyChart Video Visit    Virtual Visit via Video Note   This visit type was conducted due to national recommendations for restrictions regarding the COVID-19 Pandemic (e.g. social distancing) in an effort to limit this patient's exposure and mitigate transmission in our community. This patient is at least at moderate risk for complications without adequate follow up. This format is felt to be most appropriate for this patient at this time. Physical exam was limited by quality of the video and audio technology used for the visit.   Patient location: home Provider location: [facility]  I discussed the limitations of evaluation and management by telemedicine and the availability of in person appointments. The patient expressed understanding and agreed to proceed.  Patient: Dylan Lee   DOB: [birth year]   66 y.o. Male  MRN: [id number] Visit Date: 09/19/2021  Today's healthcare provider: Lelon Huh, MD   Chief Complaint  Patient presents with   Hospitalization Follow-up   Subjective    HPI  Follow up Hospitalization  Patient was admitted to [facility] on 09/07/2021 and discharged on 09/13/2021. He was treated for Pneumonia due to COVID-19 virus. Treatment for this included see hospital encounter.      Recommendations for Outpatient Follow-up:  Follow up with PCP in 1-2 weeks Please obtain BMP/CBC in one week  Telephone follow up was done on 09/14/2021 (left message) He reports excellent compliance with treatment. He reports this condition is improved. Was taken off of oxygen before discharge, and home oximetry has been in the mid to upper 90s. Did have ipratropium inhaler added to his inhaler regiment. Generally feels about back to baseline.   ----------------------------------------------------------------------------------------- -    Medications: Outpatient Medications Prior to Visit  Medication Sig   albuterol (PROVENTIL) (2.5 MG/3ML) 0.083% nebulizer solution Inhale 3 mLs  into the lungs every 6 (six) hours as needed for wheezing.   ascorbic acid (VITAMIN C) 500 MG tablet Take 1 tablet (500 mg total) by mouth daily.   aspirin 81 MG tablet Take 81 mg by mouth daily.   folic acid (FOLVITE) 1 MG tablet Take 1 mg by mouth daily.   guaiFENesin-dextromethorphan (ROBITUSSIN DM) 100-10 MG/5ML syrup Take 10 mLs by mouth every 4 (four) hours as needed for cough.   ipratropium (ATROVENT HFA) 17 MCG/ACT inhaler Inhale 2 puffs into the lungs every 8 (eight) hours.   methotrexate 50 MG/2ML injection INJECT 0.6 ML INTO THE SKIN ONCE A WEEK   metoprolol succinate (TOPROL-XL) 25 MG 24 hr tablet Take 1 tablet (25 mg total) by mouth daily.   rosuvastatin (CRESTOR) 40 MG tablet Take 1 tablet (40 mg total) by mouth daily.   sacubitril-valsartan (ENTRESTO) 97-103 MG Take 1 tablet by mouth 2 (two) times daily.   SYMBICORT 160-4.5 MCG/ACT inhaler INHALE 2 PUFFS INTO LUNGS TWICE DAILY   tamsulosin (FLOMAX) 0.4 MG CAPS capsule Take 1 capsule (0.4 mg total) by mouth daily.   torsemide (DEMADEX) 20 MG tablet Take 10 mg by mouth daily.   venlafaxine XR (EFFEXOR-XR) 150 MG 24 hr capsule Take 1 capsule (150 mg total) by mouth daily with breakfast. Take in addition to 75mg  tablet every day (Patient taking differently: Take 150 mg by mouth daily with breakfast.)   zinc sulfate 220 (50 Zn) MG capsule Take 1 capsule (220 mg total) by mouth daily.   No facility-administered medications prior to visit.    Review of Systems  Constitutional: Negative.   Respiratory:  Positive for cough. Negative for apnea, choking, chest tightness,  shortness of breath, wheezing and stridor.   Cardiovascular: Negative.   Gastrointestinal: Negative.   Neurological:  Negative for dizziness, light-headedness and headaches.     Objective    There were no vitals taken for this visit.   Physical Exam   Awake, alert, oriented x 3. In no apparent distress   Assessment & Plan     1. COVID-19 Is mostly back to  baseline. Discharged with addition of ipratropium inhaler to his usual inhaler regiment. Has previously schedule appt in office on 10-06-2020. Anticipate stopping new inhaler if stable at that visit.     Video connection was lost when less than 50% of the duration of the visit was complete, at which time the remainder of the visit was completed via audio only.     I discussed the assessment and treatment plan with the patient. The patient was provided an opportunity to ask questions and all were answered. The patient agreed with the plan and demonstrated an understanding of the instructions.   The patient was advised to call back or seek an in-person evaluation if the symptoms worsen or if the condition fails to improve as anticipated.  I provided 12 minutes of non-face-to-face time during this encounter.  The entirety of the information documented in the History of Present Illness, Review of Systems and Physical Exam were personally obtained by me. Portions of this information were initially documented by the CMA and reviewed by me for thoroughness and accuracy.    Lelon Huh, MD [facility] [phone] (phone) [phone] (fax)  [place]

## 2021-09-22 DIAGNOSIS — R69 Illness, unspecified: Secondary | ICD-10-CM | POA: Diagnosis not present

## 2021-09-22 DIAGNOSIS — G4733 Obstructive sleep apnea (adult) (pediatric): Secondary | ICD-10-CM | POA: Diagnosis not present

## 2021-09-22 DIAGNOSIS — J432 Centrilobular emphysema: Secondary | ICD-10-CM | POA: Diagnosis not present

## 2021-09-22 DIAGNOSIS — B59 Pneumocystosis: Secondary | ICD-10-CM | POA: Diagnosis not present

## 2021-09-22 DIAGNOSIS — J189 Pneumonia, unspecified organism: Secondary | ICD-10-CM | POA: Diagnosis not present

## 2021-09-24 ENCOUNTER — Encounter: Payer: Self-pay | Admitting: Emergency Medicine

## 2021-09-24 ENCOUNTER — Emergency Department
Admission: EM | Admit: 2021-09-24 | Discharge: 2021-09-25 | Disposition: A | Discharge status: Home / Self Care | Payer: Medicare HMO | Attending: Emergency Medicine | Admitting: Emergency Medicine

## 2021-09-24 ENCOUNTER — Emergency Department: Payer: Medicare HMO

## 2021-09-24 ENCOUNTER — Other Ambulatory Visit: Payer: Self-pay

## 2021-09-24 DIAGNOSIS — J439 Emphysema, unspecified: Secondary | ICD-10-CM | POA: Diagnosis not present

## 2021-09-24 DIAGNOSIS — J40 Bronchitis, not specified as acute or chronic: Secondary | ICD-10-CM | POA: Insufficient documentation

## 2021-09-24 DIAGNOSIS — J9811 Atelectasis: Secondary | ICD-10-CM | POA: Diagnosis not present

## 2021-09-24 DIAGNOSIS — Z20822 Contact with and (suspected) exposure to covid-19: Secondary | ICD-10-CM | POA: Insufficient documentation

## 2021-09-24 DIAGNOSIS — R0689 Other abnormalities of breathing: Secondary | ICD-10-CM | POA: Diagnosis not present

## 2021-09-24 DIAGNOSIS — J90 Pleural effusion, not elsewhere classified: Secondary | ICD-10-CM | POA: Diagnosis not present

## 2021-09-24 DIAGNOSIS — I491 Atrial premature depolarization: Secondary | ICD-10-CM | POA: Diagnosis not present

## 2021-09-24 DIAGNOSIS — J189 Pneumonia, unspecified organism: Secondary | ICD-10-CM | POA: Diagnosis not present

## 2021-09-24 DIAGNOSIS — U071 COVID-19: Secondary | ICD-10-CM | POA: Diagnosis not present

## 2021-09-24 DIAGNOSIS — I7121 Aneurysm of the ascending aorta, without rupture: Secondary | ICD-10-CM | POA: Diagnosis not present

## 2021-09-24 DIAGNOSIS — I509 Heart failure, unspecified: Secondary | ICD-10-CM | POA: Insufficient documentation

## 2021-09-24 DIAGNOSIS — I110 Hypertensive heart disease with heart failure: Secondary | ICD-10-CM | POA: Insufficient documentation

## 2021-09-24 DIAGNOSIS — R0602 Shortness of breath: Secondary | ICD-10-CM | POA: Diagnosis not present

## 2021-09-24 DIAGNOSIS — A419 Sepsis, unspecified organism: Secondary | ICD-10-CM

## 2021-09-24 DIAGNOSIS — Z743 Need for continuous supervision: Secondary | ICD-10-CM | POA: Diagnosis not present

## 2021-09-24 DIAGNOSIS — I517 Cardiomegaly: Secondary | ICD-10-CM | POA: Diagnosis not present

## 2021-09-24 DIAGNOSIS — K449 Diaphragmatic hernia without obstruction or gangrene: Secondary | ICD-10-CM | POA: Diagnosis not present

## 2021-09-24 DIAGNOSIS — R000 Tachycardia, unspecified: Secondary | ICD-10-CM | POA: Diagnosis not present

## 2021-09-24 LAB — COMPREHENSIVE METABOLIC PANEL
ALT: 34 U/L (ref 0–44)
AST: 30 U/L (ref 15–41)
Albumin: 2.5 g/dL — ABNORMAL LOW (ref 3.5–5.0)
Alkaline Phosphatase: 36 U/L — ABNORMAL LOW (ref 38–126)
Anion gap: 5 (ref 5–15)
BUN: 12 mg/dL (ref 8–23)
CO2: 23 mmol/L (ref 22–32)
Calcium: 7.6 mg/dL — ABNORMAL LOW (ref 8.9–10.3)
Chloride: 106 mmol/L (ref 98–111)
Creatinine, Ser: 1.14 mg/dL (ref 0.61–1.24)
GFR, Estimated: >60 mL/min (ref >60)
Glucose, Bld: 152 mg/dL — ABNORMAL HIGH (ref 70–99)
Potassium: 3.8 mmol/L (ref 3.5–5.1)
Sodium: 134 mmol/L — ABNORMAL LOW (ref 135–145)
Total Bilirubin: 0.8 mg/dL (ref 0.3–1.2)
Total Protein: 5.7 g/dL — ABNORMAL LOW (ref 6.5–8.1)

## 2021-09-24 LAB — CBC WITH DIFFERENTIAL/PLATELET
Abs Immature Granulocytes: 0.05 10*3/uL (ref 0.00–0.07)
Basophils Absolute: 0 10*3/uL (ref 0.0–0.1)
Basophils Relative: 0 %
Eosinophils Absolute: 0.1 10*3/uL (ref 0.0–0.5)
Eosinophils Relative: 0 %
HCT: 36 % — ABNORMAL LOW (ref 39.0–52.0)
Hemoglobin: 11.8 g/dL — ABNORMAL LOW (ref 13.0–17.0)
Immature Granulocytes: 0 %
Lymphocytes Relative: 10 %
Lymphs Abs: 1.1 10*3/uL (ref 0.7–4.0)
MCH: 34.5 pg — ABNORMAL HIGH (ref 26.0–34.0)
MCHC: 32.8 g/dL (ref 30.0–36.0)
MCV: 105.3 fL — ABNORMAL HIGH (ref 80.0–100.0)
Monocytes Absolute: 1.3 10*3/uL — ABNORMAL HIGH (ref 0.1–1.0)
Monocytes Relative: 11 %
Neutro Abs: 8.8 10*3/uL — ABNORMAL HIGH (ref 1.7–7.7)
Neutrophils Relative %: 79 %
Platelets: 166 10*3/uL (ref 150–400)
RBC: 3.42 MIL/uL — ABNORMAL LOW (ref 4.22–5.81)
RDW: 16.6 % — ABNORMAL HIGH (ref 11.5–15.5)
WBC: 11.3 10*3/uL — ABNORMAL HIGH (ref 4.0–10.5)
nRBC: 0 % (ref 0.0–0.2)

## 2021-09-24 LAB — RESP PANEL BY RT-PCR (FLU A&B, COVID) ARPGX2
Influenza A by PCR: NEGATIVE
Influenza B by PCR: NEGATIVE
SARS Coronavirus 2 by RT PCR: NEGATIVE

## 2021-09-24 LAB — TROPONIN I (HIGH SENSITIVITY): Troponin I (High Sensitivity): 25 ng/L — ABNORMAL HIGH (ref <18)

## 2021-09-24 LAB — PROTIME-INR
INR: 1.1 (ref 0.8–1.2)
Prothrombin Time: 14.3 seconds (ref 11.4–15.2)

## 2021-09-24 LAB — LACTIC ACID, PLASMA: Lactic Acid, Venous: 1.9 mmol/L (ref 0.5–1.9)

## 2021-09-24 MED ORDER — DOXYCYCLINE HYCLATE 100 MG PO TABS
100.0000 mg | ORAL_TABLET | Freq: Once | ORAL | Status: AC
  Administered 2021-09-25: 100 mg via ORAL
  Filled 2021-09-24: qty 1

## 2021-09-24 MED ORDER — METHYLPREDNISOLONE SODIUM SUCC 125 MG IJ SOLR
125.0000 mg | INTRAMUSCULAR | Status: AC
  Administered 2021-09-25: 125 mg via INTRAVENOUS
  Filled 2021-09-24: qty 2

## 2021-09-24 MED ORDER — IPRATROPIUM-ALBUTEROL 0.5-2.5 (3) MG/3ML IN SOLN
3.0000 mL | Freq: Once | RESPIRATORY_TRACT | Status: AC
  Administered 2021-09-25: 3 mL via RESPIRATORY_TRACT
  Filled 2021-09-24: qty 3

## 2021-09-24 MED ORDER — IOHEXOL 350 MG/ML SOLN
75.0000 mL | Freq: Once | INTRAVENOUS | Status: AC | PRN
  Administered 2021-09-24: 75 mL via INTRAVENOUS

## 2021-09-24 MED ORDER — PREDNISONE 10 MG PO TABS: ORAL_TABLET | ORAL | 0 refills | Status: AC

## 2021-09-24 MED ORDER — DOXYCYCLINE HYCLATE 100 MG PO CAPS: 100.0000 mg | ORAL_CAPSULE | Freq: Two times a day (BID) | ORAL | 0 refills | Status: AC

## 2021-09-24 NOTE — ED Notes (Signed)
Pt placed on cardiac monitor in triage, pt with multiple PVC noted.

## 2021-09-24 NOTE — ED Triage Notes (Signed)
Pt BIB EMS; called out for SOB, desat to 90% with exertion; pt diagnosed with COVID 09/07/22 and pneumonia; per EMS pt having multifocal PVCs, RR 30s, HR 106, temp 100.4, CBG 175

## 2021-09-24 NOTE — Discharge Instructions (Signed)
Your lab tests and CT scan today were all okay.  Take doxycycline to help protect your lungs, prednisone to calm inflammation, and continue using your inhalers at home.  Please follow-up with your primary care doctor this week.

## 2021-09-24 NOTE — Progress Notes (Signed)
Patient came from ED to CT for PE Chest Scan. Pt had 60 mL Omni 350 extravasation to left antecubital area. IV was removed, arm was elevated, cold compress applied to area, and verbal instructions were given to patient. Prior to injection, the IV was flushed multiple times and blood return was noted. A verbal handoff was given to RN Lilia Pro @2312  on 09/24/21. Contrast extravasation discharge orders were placed in Epic chart.

## 2021-09-24 NOTE — ED Notes (Signed)
Pt unable to sign for MSE 

## 2021-09-25 ENCOUNTER — Telehealth: Payer: Self-pay

## 2021-09-25 NOTE — Telephone Encounter (Signed)
He can have a samday appt tomorrow or Wednesday.

## 2021-09-25 NOTE — Telephone Encounter (Signed)
Copied from [facility] [phone]. Topic: Appointment Scheduling - Scheduling Inquiry for Clinic >> Sep 25, 2021 10:39 AM Scherrie Gerlach wrote: Reason for CRM: pt went to ED last night w/ fever and breathing issues.  Advised him to follow up w/ Dr Caryn Section in 2 days.  No appt available. Pt just got out of hospital w/ covid.  But he was covid negative last night. Please advise if pt can be worked in

## 2021-09-25 NOTE — ED Provider Notes (Signed)
[facility] Provider Note    Event Date/Time   First MD Initiated Contact with Patient 09/24/21 2207     (approximate)   History   Shortness of Breath   HPI  Dylan Lee is a 66 y.o. male with a history of hypertension, hyperlipidemia, CHF who comes ED complaining of shortness of breath and left chest wall pain.  He was recently hospitalized with covid pneumonia, and over the last few days at home he has been having worsening shortness of breath, low-grade fever.  He also fell at home hitting his left chest wall which is painful and worse with breathing.  He has nonproductive cough.  No hemoptysis.  No exertional symptoms.   Past Medical History:  Diagnosis Date   CHF (congestive heart failure) ([place])    Hepatitis C    Hep C treated    History of chicken pox    History of measles    History of mumps    Hyperlipidemia    Hypertension    Kidney stones    Shingles 09/06/2015        Physical Exam   Triage Vital Signs: ED Triage Vitals  Enc Vitals Group     BP 09/24/21 2142 130/82     Pulse Rate 09/24/21 2142 (!) 104     Resp 09/24/21 2142 (!) 24     Temp 09/24/21 2142 98.9 F (37.2 C)     Temp Source 09/24/21 2142 Oral     SpO2 09/24/21 2142 94 %     Weight 09/24/21 2123 230 lb (104.3 kg)     Height 09/24/21 2123 5\' 11"  (1.803 m)     Head Circumference --      Peak Flow --      Pain Score 09/24/21 2122 6     Pain Loc --      Pain Edu? --      Excl. in [place]? --     Most recent vital signs: Vitals:   09/24/21 2200 09/24/21 2230  BP: 129/76 125/71  Pulse: (!) 101 97  Resp: (!) 23 (!) 22  Temp:    SpO2: 97% 97%     General: Awake, no distress.  CV:  Good peripheral perfusion.  Mild tachycardia, heart rate of 100 Resp:  Normal effort.  Bilateral expiratory wheezing.  Normal expiratory phase. Abd:  No distention.  Nontender, soft Other:  No lower extremity edema.  Left chest wall tender to the touch in indicated area of pain.   Ribs are stable, no crepitus   ED Results / Procedures / Treatments   Labs (all labs ordered are listed, but only abnormal results are displayed) Labs Reviewed  COMPREHENSIVE METABOLIC PANEL - Abnormal; Notable for the following components:      Result Value   Sodium 134 (*)    Glucose, Bld 152 (*)    Calcium 7.6 (*)    Total Protein 5.7 (*)    Albumin 2.5 (*)    Alkaline Phosphatase 36 (*)    All other components within normal limits  CBC WITH DIFFERENTIAL/PLATELET - Abnormal; Notable for the following components:   WBC 11.3 (*)    RBC 3.42 (*)    Hemoglobin 11.8 (*)    HCT 36.0 (*)    MCV 105.3 (*)    MCH 34.5 (*)    RDW 16.6 (*)    Neutro Abs 8.8 (*)    Monocytes Absolute 1.3 (*)    All other components within  normal limits  TROPONIN I (HIGH SENSITIVITY) - Abnormal; Notable for the following components:   Troponin I (High Sensitivity) 25 (*)    All other components within normal limits  RESP PANEL BY RT-PCR (FLU A&B, COVID) ARPGX2  CULTURE, BLOOD (ROUTINE X 2)  CULTURE, BLOOD (ROUTINE X 2)  LACTIC ACID, PLASMA  PROTIME-INR  LACTIC ACID, PLASMA  URINALYSIS, ROUTINE W REFLEX MICROSCOPIC     EKG     RADIOLOGY X-ray viewed and interpreted by me, shows some hazy basilar opacities bilaterally.  No  pneumothorax or pleural effusion.  No pulmonary edema.  Radiology report reviewed, indicating this is improved compared to previous.  CT angiogram of the chest is negative for PE.  No other significant acute findings.    PROCEDURES:  Critical Care performed: No  Procedures   MEDICATIONS ORDERED IN ED: Medications  methylPREDNISolone sodium succinate (SOLU-MEDROL) 125 mg/2 mL injection 125 mg (has no administration in time range)  ipratropium-albuterol (DUONEB) 0.5-2.5 (3) MG/3ML nebulizer solution 3 mL (has no administration in time range)  doxycycline (VIBRA-TABS) tablet 100 mg (has no administration in time range)  iohexol (OMNIPAQUE) 350 MG/ML injection 75  mL (75 mLs Intravenous Contrast Given 09/24/21 2240)     IMPRESSION / MDM / ASSESSMENT AND PLAN / ED COURSE  I reviewed the triage vital signs and the nursing notes.                              Differential diagnosis includes, but is not limited to, pneumonia, pleural effusion, pulmonary edema, bronchitis, post-COVID syndrome, pulmonary embolism, chest wall contusion     Patient presents with increased shortness of breath and some left chest wall pain.  He recently had COVID, recent hospitalization, this raises suspicion of PE, CT angiogram obtained which is negative for PE.  Lab panels are reassuring with unremarkable CBC, essentially normal chemistry panel.  Flu test is negative, lactate is normal.  Troponin is 25 which is lower than it was while he was in the hospital 2 weeks ago.  Doubt ACS.  Doubt dissection or pericarditis.  I think this is post COVID syndrome with bronchitis.  His worsening of symptoms coincides with the conclusion of his steroid taper.  I will reinitiate steroids, prescribe doxycycline.  We will give the patient Solu-Medrol and a DuoNeb here for symptom relief.  He stable for discharge home.  Patient is comfortable with follow-up plan.     FINAL CLINICAL IMPRESSION(S) / ED DIAGNOSES   Final diagnoses:  Bronchitis     Rx / DC Orders   ED Discharge Orders          Ordered    doxycycline (VIBRAMYCIN) 100 MG capsule  2 times daily        09/24/21 2349    predniSONE (DELTASONE) 10 MG tablet        09/24/21 2349             Note:  This document was prepared using Dragon voice recognition software and may include unintentional dictation errors.   Carrie Mew, MD 09/25/21 [phone]

## 2021-09-26 ENCOUNTER — Ambulatory Visit (INDEPENDENT_AMBULATORY_CARE_PROVIDER_SITE_OTHER): Payer: Medicare HMO | Admitting: Family Medicine

## 2021-09-26 ENCOUNTER — Encounter: Payer: Self-pay | Admitting: Family Medicine

## 2021-09-26 ENCOUNTER — Other Ambulatory Visit: Payer: Self-pay

## 2021-09-26 VITALS — BP 134/76 | HR 80 | Temp 98.6°F | Resp 20 | Wt 241.0 lb

## 2021-09-26 DIAGNOSIS — Z09 Encounter for follow-up examination after completed treatment for conditions other than malignant neoplasm: Secondary | ICD-10-CM

## 2021-09-26 DIAGNOSIS — U071 COVID-19: Secondary | ICD-10-CM

## 2021-09-26 DIAGNOSIS — J1282 Pneumonia due to coronavirus disease 2019: Secondary | ICD-10-CM

## 2021-09-26 NOTE — Progress Notes (Signed)
Established patient visit   Patient: Dylan Lee   DOB: 29-Nov-1955   66 y.o. Male  MRN: [id number] Visit Date: 09/26/2021  Today's healthcare provider: Lelon Huh, MD   Chief Complaint  Patient presents with   Follow-up   Subjective    HPI  Follow up Hospitalization and ER visit  He was hospitalized 12/22 through 12/28 for Covid pneumonia. He had a fall, bruising his left ribs prior to admission. There was no indication of rib fracture on the Xray report.  Transition of care telephone follow up was done1/11/2021 Patient was seen in ER for SOB and left chest wall pain on 09/24/2021.He was treated for bronchitis. Treatment for this included Solu-Medrol and a DuoNeb in the ER for symptom relief. Patient was discharged home on prednisone taper and doxycyline.  He reports good compliance with treatment. He reports this condition is Unchanged. Patient reports his breathing has slightly improved, but he still has chest wall pain and cough.  -----------------------------------------------------------------------------------------   Medications: Outpatient Medications Prior to Visit  Medication Sig   albuterol (PROVENTIL) (2.5 MG/3ML) 0.083% nebulizer solution Inhale 3 mLs into the lungs every 6 (six) hours as needed for wheezing.   aspirin 81 MG tablet Take 81 mg by mouth daily.   doxycycline (VIBRAMYCIN) 100 MG capsule Take 1 capsule (100 mg total) by mouth 2 (two) times daily for 10 days.   ipratropium (ATROVENT HFA) 17 MCG/ACT inhaler Inhale 2 puffs into the lungs every 8 (eight) hours.   methotrexate 50 MG/2ML injection INJECT 0.6 ML INTO THE SKIN ONCE A WEEK   metoprolol succinate (TOPROL-XL) 25 MG 24 hr tablet Take 1 tablet (25 mg total) by mouth daily.   predniSONE (DELTASONE) 10 MG tablet Take 5 tablets (50 mg total) by mouth daily for 3 days, THEN 4 tablets (40 mg total) daily for 3 days, THEN 3 tablets (30 mg total) daily for 3 days, THEN 2 tablets (20 mg total)  daily for 3 days, THEN 1 tablet (10 mg total) daily for 3 days.   rosuvastatin (CRESTOR) 40 MG tablet Take 1 tablet (40 mg total) by mouth daily.   sacubitril-valsartan (ENTRESTO) 97-103 MG Take 1 tablet by mouth 2 (two) times daily.   SYMBICORT 160-4.5 MCG/ACT inhaler INHALE 2 PUFFS INTO LUNGS TWICE DAILY   torsemide (DEMADEX) 20 MG tablet Take 10 mg by mouth daily.   venlafaxine XR (EFFEXOR-XR) 150 MG 24 hr capsule Take 1 capsule (150 mg total) by mouth daily with breakfast. Take in addition to 75mg  tablet every day (Patient taking differently: Take 150 mg by mouth daily with breakfast.)   tamsulosin (FLOMAX) 0.4 MG CAPS capsule Take 1 capsule (0.4 mg total) by mouth daily. (Patient not taking: Reported on 09/26/2021)   [DISCONTINUED] ascorbic acid (VITAMIN C) 500 MG tablet Take 1 tablet (500 mg total) by mouth daily. (Patient not taking: Reported on 09/26/2021)   [DISCONTINUED] folic acid (FOLVITE) 1 MG tablet Take 1 mg by mouth daily. (Patient not taking: Reported on 09/26/2021)   [DISCONTINUED] zinc sulfate 220 (50 Zn) MG capsule Take 1 capsule (220 mg total) by mouth daily. (Patient not taking: Reported on 09/26/2021)   No facility-administered medications prior to visit.    Review of Systems  Constitutional:  Negative for appetite change, chills and fever.  Respiratory:  Positive for shortness of breath and wheezing. Negative for chest tightness.   Cardiovascular:  Positive for chest pain. Negative for palpitations.  Gastrointestinal:  Negative for abdominal pain,  nausea and vomiting.      Objective    BP 134/76 (BP Location: Left Arm, Patient Position: Sitting, Cuff Size: Large)    Pulse 80    Temp 98.6 F (37 C) (Oral)    Resp 20    Wt 241 lb (109.3 kg)    SpO2 98% Comment: room air   BMI 33.61 kg/m  {Show previous vital signs (optional):23777}  Physical Exam    General: Appearance:    Mildly obese male in no acute distress  Eyes:    PERRL, conjunctiva/corneas clear, EOM's intact        Lungs:     Clear to auscultation bilaterally, respirations unlabored. Diminished breath sounds.   Heart:    Normal heart rate. Normal rhythm. No murmurs, rubs, or gallops.    MS:   All extremities are intact.  3+ bilateral edema.   Neurologic:   Awake, alert, oriented x 3. No apparent focal neurological defect.         Assessment & Plan     1. Pneumonia due to COVID-19 virus On third day of prednisone and doxycycline prescribed at ER visit. Is to complete as prescribed. He is having coughing and clearing airways due to rib pain. Given prescription for incentive spirometer. Has cardiology follow up scheduled next week. Does have some edema, but has been off torsemide which he is now restarting.        The entirety of the information documented in the History of Present Illness, Review of Systems and Physical Exam were personally obtained by me. Portions of this information were initially documented by the CMA and reviewed by me for thoroughness and accuracy.     Lelon Huh, MD  [facility] [phone] (phone) [phone] (fax)  [place]

## 2021-09-27 DIAGNOSIS — J1282 Pneumonia due to coronavirus disease 2019: Secondary | ICD-10-CM | POA: Diagnosis not present

## 2021-09-27 DIAGNOSIS — U071 COVID-19: Secondary | ICD-10-CM | POA: Diagnosis not present

## 2021-09-27 DIAGNOSIS — G4733 Obstructive sleep apnea (adult) (pediatric): Secondary | ICD-10-CM | POA: Diagnosis not present

## 2021-09-27 DIAGNOSIS — J439 Emphysema, unspecified: Secondary | ICD-10-CM | POA: Diagnosis not present

## 2021-09-27 DIAGNOSIS — R0609 Other forms of dyspnea: Secondary | ICD-10-CM | POA: Diagnosis not present

## 2021-09-29 DIAGNOSIS — I2510 Atherosclerotic heart disease of native coronary artery without angina pectoris: Secondary | ICD-10-CM | POA: Diagnosis not present

## 2021-09-29 DIAGNOSIS — Z720 Tobacco use: Secondary | ICD-10-CM | POA: Diagnosis not present

## 2021-09-29 DIAGNOSIS — Z95810 Presence of automatic (implantable) cardiac defibrillator: Secondary | ICD-10-CM | POA: Diagnosis not present

## 2021-09-29 DIAGNOSIS — I428 Other cardiomyopathies: Secondary | ICD-10-CM | POA: Diagnosis not present

## 2021-09-29 DIAGNOSIS — G4733 Obstructive sleep apnea (adult) (pediatric): Secondary | ICD-10-CM | POA: Diagnosis not present

## 2021-09-29 DIAGNOSIS — I5023 Acute on chronic systolic (congestive) heart failure: Secondary | ICD-10-CM | POA: Diagnosis not present

## 2021-09-29 DIAGNOSIS — I480 Paroxysmal atrial fibrillation: Secondary | ICD-10-CM | POA: Diagnosis not present

## 2021-09-29 DIAGNOSIS — J449 Chronic obstructive pulmonary disease, unspecified: Secondary | ICD-10-CM | POA: Diagnosis not present

## 2021-09-29 DIAGNOSIS — Z09 Encounter for follow-up examination after completed treatment for conditions other than malignant neoplasm: Secondary | ICD-10-CM | POA: Diagnosis not present

## 2021-09-29 DIAGNOSIS — U071 COVID-19: Secondary | ICD-10-CM | POA: Diagnosis not present

## 2021-09-29 DIAGNOSIS — R55 Syncope and collapse: Secondary | ICD-10-CM | POA: Diagnosis not present

## 2021-09-29 LAB — CULTURE, BLOOD (ROUTINE X 2)
Culture: NO GROWTH
Culture: NO GROWTH
Special Requests: ADEQUATE

## 2021-10-06 ENCOUNTER — Other Ambulatory Visit: Payer: Self-pay

## 2021-10-06 ENCOUNTER — Encounter: Payer: Self-pay | Admitting: Family Medicine

## 2021-10-06 ENCOUNTER — Ambulatory Visit (INDEPENDENT_AMBULATORY_CARE_PROVIDER_SITE_OTHER): Payer: Medicare HMO | Admitting: Family Medicine

## 2021-10-06 VITALS — BP 102/74 | HR 85 | Temp 98.5°F | Resp 16 | Wt 226.0 lb

## 2021-10-06 DIAGNOSIS — J449 Chronic obstructive pulmonary disease, unspecified: Secondary | ICD-10-CM

## 2021-10-06 DIAGNOSIS — I7121 Aneurysm of the ascending aorta, without rupture: Secondary | ICD-10-CM | POA: Diagnosis not present

## 2021-10-06 DIAGNOSIS — R7303 Prediabetes: Secondary | ICD-10-CM | POA: Diagnosis not present

## 2021-10-06 LAB — POCT GLYCOSYLATED HEMOGLOBIN (HGB A1C)
Est. average glucose Bld gHb Est-mCnc: 131
Hemoglobin A1C: 6.2 % — AB (ref 4.0–5.6)

## 2021-10-06 NOTE — Progress Notes (Signed)
Established patient visit   Patient: Dylan Lee   DOB: 19-May-1956   66 y.o. Male  MRN: [id number] Visit Date: 10/06/2021  Today's healthcare provider: Lelon Huh, MD   Chief Complaint  Patient presents with   Depression   COPD   Prediabetes   Subjective    HPI  Depression, Follow-up  He  was last seen for this 6 months ago. Changes made at last visit include none; refilled Effexor.   He reports good compliance with treatment. He is not having side effects.   He reports good tolerance of treatment. Current symptoms include: depressed mood He feels he is Unchanged since last visit.  Depression screen [facility] 2/9 10/06/2021 04/03/2021 02/17/2021  Decreased Interest 0 1 1  Down, Depressed, Hopeless 3 1 1   PHQ - 2 Score 3 2 2   Altered sleeping 0 1 1  Tired, decreased energy 3 1 0  Change in appetite 0 0 2  Feeling bad or failure about yourself  3 1 1   Trouble concentrating 1 0 0  Moving slowly or fidgety/restless 1 1 0  Suicidal thoughts 0 0 0  PHQ-9 Score 11 6 6   Difficult doing work/chores Somewhat difficult Not difficult at all Not difficult at all    -----------------------------------------------------------------------------------------   Follow up for COPD:  The patient was last seen for this 6 months ago. Changes made at last visit include none; was doing well with Symbicort.  He reports good compliance with treatment. He feels that condition is Unchanged. He is not having side effects.   -----------------------------------------------------------------------------------------  Prediabetes, Follow-up  Lab Results  Component Value Date   HGBA1C 6.2 (A) 10/06/2021   HGBA1C 5.7 (A) 09/12/2020   HGBA1C 6.0 (A) 06/12/2019   GLUCOSE 152 (H) 09/24/2021   GLUCOSE 141 (H) 09/12/2021   GLUCOSE 121 (H) 09/11/2021    -----------------------------------------------------------------------------------------   Was was treated with doxycycline and  prednisone at ER on 09/24/2021 which has nearly finished. He was also found to have thoracic aortic aneurysm on CTA done at that visit. He states that his cardiologist has already initiated a referral to [place] [facility] .   Medications: Outpatient Medications Prior to Visit  Medication Sig   albuterol (PROVENTIL) (2.5 MG/3ML) 0.083% nebulizer solution Inhale 3 mLs into the lungs every 6 (six) hours as needed for wheezing.   aspirin 81 MG tablet Take 81 mg by mouth daily.   ipratropium (ATROVENT HFA) 17 MCG/ACT inhaler Inhale 2 puffs into the lungs every 8 (eight) hours.   methotrexate 50 MG/2ML injection INJECT 0.6 ML INTO THE SKIN ONCE A WEEK   metoprolol succinate (TOPROL-XL) 25 MG 24 hr tablet Take 1 tablet (25 mg total) by mouth daily.   predniSONE (DELTASONE) 10 MG tablet Take 5 tablets (50 mg total) by mouth daily for 3 days, THEN 4 tablets (40 mg total) daily for 3 days, THEN 3 tablets (30 mg total) daily for 3 days, THEN 2 tablets (20 mg total) daily for 3 days, THEN 1 tablet (10 mg total) daily for 3 days.   rosuvastatin (CRESTOR) 40 MG tablet Take 1 tablet (40 mg total) by mouth daily.   sacubitril-valsartan (ENTRESTO) 97-103 MG Take 1 tablet by mouth 2 (two) times daily.   SYMBICORT 160-4.5 MCG/ACT inhaler INHALE 2 PUFFS INTO LUNGS TWICE DAILY   tamsulosin (FLOMAX) 0.4 MG CAPS capsule Take 1 capsule (0.4 mg total) by mouth daily.   torsemide (DEMADEX) 20 MG tablet Take 10 mg by mouth  daily.   venlafaxine XR (EFFEXOR-XR) 150 MG 24 hr capsule Take 1 capsule (150 mg total) by mouth daily with breakfast. Take in addition to 75mg  tablet every day (Patient taking differently: Take 150 mg by mouth daily with breakfast.)   No facility-administered medications prior to visit.    Review of Systems  Constitutional:  Negative for appetite change, chills and fever.  Respiratory:  Negative for chest tightness, shortness of breath and wheezing.   Cardiovascular:  Negative for chest pain and  palpitations.  Gastrointestinal:  Negative for abdominal pain, nausea and vomiting.      Objective    BP 102/74 (BP Location: Left Arm, Patient Position: Sitting, Cuff Size: Large)    Pulse 85    Temp 98.5 F (36.9 C) (Oral)    Resp 16    Wt 226 lb (102.5 kg)    SpO2 97% Comment: room air   BMI 31.52 kg/m  {Show previous vital signs (optional):23777}  Physical Exam   General: Appearance:    Mildly obese male in no acute distress  Eyes:    PERRL, conjunctiva/corneas clear, EOM's intact       Lungs:     Clear to auscultation bilaterally, respirations unlabored  Heart:    Normal heart rate. Normal rhythm. No murmurs, rubs, or gallops.    MS:   All extremities are intact.    Neurologic:   Awake, alert, oriented x 3. No apparent focal neurological defect.         Results for orders placed or performed in visit on 10/06/21  POCT HgB A1C  Result Value Ref Range   Hemoglobin A1C 6.2 (A) 4.0 - 5.6 %   Est. average glucose Bld gHb Est-mCnc 131     Assessment & Plan     1. Pre-diabetes Stable.   2. Chronic obstructive pulmonary disease, unspecified COPD type ([place]) Is steadily improving on prednisone and doxycycline which he is to finish. Continue routine follow up with Dr. Raul Del.   3. Aneurysm of ascending aorta without rupture Incidental finding on recent CTA, CT surgery to [facility] referral by his cardiologist is in progress.      Future Appointments  Date Time Provider [place]  10/09/2021  1:40 PM [facility] HEALTH ADVISOR [facility]  02/16/2022 10:00 AM Caryn Section Kirstie Peri, MD [facility]        The entirety of the information documented in the History of Present Illness, Review of Systems and Physical Exam were personally obtained by me. Portions of this information were initially documented by the CMA and reviewed by me for thoroughness and accuracy.     Lelon Huh, MD  [facility] [phone] (phone) [phone] (fax)  [place]

## 2021-10-09 ENCOUNTER — Ambulatory Visit (INDEPENDENT_AMBULATORY_CARE_PROVIDER_SITE_OTHER): Payer: Medicare HMO

## 2021-10-09 DIAGNOSIS — Z0000 Encounter for general adult medical examination without abnormal findings: Secondary | ICD-10-CM | POA: Diagnosis not present

## 2021-10-09 NOTE — Progress Notes (Signed)
Virtual Visit via Telephone Note  I connected with  Dylan Lee on 10/09/21 at  1:40 PM EST by telephone and verified that I am speaking with the correct person using two identifiers.  Location: Patient: home Provider: [facility] Persons participating in the virtual visit: [place]   I discussed the limitations, risks, security and privacy concerns of performing an evaluation and management service by telephone and the availability of in person appointments. The patient expressed understanding and agreed to proceed.  Interactive audio and video telecommunications were attempted between this nurse and patient, however failed, due to patient having technical difficulties OR patient did not have access to video capability.  We continued and completed visit with audio only.  Some vital signs may be absent or patient reported.   Dionisio David, LPN  Subjective:   Dylan Lee is a 66 y.o. male who presents for Medicare Annual/Subsequent preventive examination.  Review of Systems           Objective:    There were no vitals filed for this visit. There is no height or weight on file to calculate BMI.  Advanced Directives 09/24/2021 09/08/2021 09/07/2021 10/03/2020 06/03/2015 05/25/2015 03/02/2015  Does Patient Have a Medical Advance Directive? No - No No No No No  Would patient like information on creating a medical advance directive? No - Patient declined No - Patient declined - No - Patient declined No - patient declined information No - patient declined information No - patient declined information    Current Medications (verified) Outpatient Encounter Medications as of 10/09/2021  Medication Sig   albuterol (PROVENTIL) (2.5 MG/3ML) 0.083% nebulizer solution Inhale 3 mLs into the lungs every 6 (six) hours as needed for wheezing.   aspirin 81 MG tablet Take 81 mg by mouth daily.   ipratropium (ATROVENT HFA) 17 MCG/ACT inhaler Inhale 2 puffs into the lungs every 8  (eight) hours.   methotrexate 50 MG/2ML injection INJECT 0.6 ML INTO THE SKIN ONCE A WEEK   metoprolol succinate (TOPROL-XL) 25 MG 24 hr tablet Take 1 tablet (25 mg total) by mouth daily.   predniSONE (DELTASONE) 10 MG tablet Take 5 tablets (50 mg total) by mouth daily for 3 days, THEN 4 tablets (40 mg total) daily for 3 days, THEN 3 tablets (30 mg total) daily for 3 days, THEN 2 tablets (20 mg total) daily for 3 days, THEN 1 tablet (10 mg total) daily for 3 days.   rosuvastatin (CRESTOR) 40 MG tablet Take 1 tablet (40 mg total) by mouth daily.   sacubitril-valsartan (ENTRESTO) 97-103 MG Take 1 tablet by mouth 2 (two) times daily.   SYMBICORT 160-4.5 MCG/ACT inhaler INHALE 2 PUFFS INTO LUNGS TWICE DAILY   tamsulosin (FLOMAX) 0.4 MG CAPS capsule Take 1 capsule (0.4 mg total) by mouth daily.   torsemide (DEMADEX) 20 MG tablet Take 10 mg by mouth daily.   venlafaxine XR (EFFEXOR-XR) 150 MG 24 hr capsule Take 1 capsule (150 mg total) by mouth daily with breakfast. Take in addition to 75mg  tablet every day (Patient taking differently: Take 150 mg by mouth daily with breakfast.)   No facility-administered encounter medications on file as of 10/09/2021.    Allergies (verified) Patient has no known allergies.   History: Past Medical History:  Diagnosis Date   CHF (congestive heart failure) (HCC)    Hepatitis C    Hep C treated    History of chicken pox    History of measles  History of mumps    Hyperlipidemia    Hypertension    Kidney stones    Shingles 09/06/2015   Past Surgical History:  Procedure Laterality Date   BUNIONECTOMY     CARDIAC CATHETERIZATION N/A 03/02/2015   Procedure: Right and Left Heart Cath;  Surgeon: Yolonda Kida, MD;  Location: [place] [facility];  Service: Cardiovascular;  Laterality: N/A;   DENTAL SURGERY     FOOT SURGERY Right    IMPLANTABLE CARDIOVERTER DEFIBRILLATOR (ICD) GENERATOR CHANGE Right 06/03/2015   Procedure: ICD generator insertion ;  Surgeon:  Marzetta Board, MD;  Location: [facility] ORS;  Service: Cardiovascular;  Laterality: Right;   LITHOTRIPSY     sleep study  07/2010   severe OSA, snoring. Bilevel pressure at 19/14. Jiengel. ENT consult/ Jiengel: no ENT surgical indication for OSA   SPIROMETRY  10/20/2014   Severe obstruction   Family History  Problem Relation Age of Onset   Hypertension Mother    Bipolar disorder Mother    Ovarian cancer Mother    Heart disease Mother    Diabetes Father    Alcohol abuse Father    CAD Father    Depression Father    Alcohol abuse Sister    Congestive Heart Failure Brother    Alcohol abuse Brother    Liver cancer Brother    Social History   Socioeconomic History   Marital status: Married    Spouse name: Not on file   Number of children: 0   Years of education: Not on file   Highest education level: High school graduate  Occupational History   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 30+ years ago  Vaping Use   Vaping Use: Never used  Substance and Sexual Activity   Alcohol use: No    Alcohol/week: 0.0 standard drinks   Drug use: No   Sexual activity: Not on file  Other Topics Concern   Not on file  Social History Narrative   Not on file   Social Determinants of Health   Financial Resource Strain: Not on file  Food Insecurity: Not on file  Transportation Needs: Not on file  Physical Activity: Not on file  Stress: Not on file  Social Connections: Not on file    Tobacco Counseling Ready to quit: Not Answered Counseling given: Not Answered Tobacco comments: smokes 1 cigar daily; quit cigs. 30+ years ago   Clinical Intake:  Pre-visit preparation completed: Yes  Pain : No/denies pain     Nutritional Risks: None Diabetes: No  How often  do you need to have someone help you when you read instructions, pamphlets, or other written materials from your doctor or pharmacy?: 1 - Never  Diabetic?no  Interpreter Needed?: No  Information entered by :: Kirke Shaggy LPN   Activities of Daily Living In your present state of health, do you have any difficulty performing the following activities: 09/08/2021 04/03/2021  Hearing? N Y  Vision? Y N  Difficulty concentrating or making decisions? N N  Walking or climbing stairs? Y Y  Dressing or bathing? N N  Doing errands, shopping? N N  Some recent data might be hidden    Patient Care Team: Birdie Sons,  MD as PCP - General (Family Medicine) Manya Silvas, MD (Inactive) (Gastroenterology) Yolonda Kida, MD as Consulting Physician (Cardiology) Marlowe Sax, MD as Referring Physician (Internal Medicine) Ocie Doyne, Cora (Optometry)  Indicate any recent Medical Services you may have received from other than [facility] providers in the past year (date may be approximate).     Assessment:   This is a routine wellness examination for Roxy.  Hearing/Vision screen No results found.  Dietary issues and exercise activities discussed:     Goals Addressed   None    Depression Screen PHQ 2/9 Scores 10/06/2021 04/03/2021 02/17/2021 12/13/2020 10/03/2020 09/12/2020 09/29/2018  PHQ - 2 Score 3 2 2 4  0 2 4  PHQ- 9 Score 11 6 6 13  - 11 11    Fall Risk Fall Risk  04/03/2021 02/17/2021 12/13/2020 10/03/2020 09/29/2018  Falls in the past year? 0 0 0 0 0  Number falls in past yr: 0 0 0 0 0  Injury with Fall? 0 0 0 0 0  Risk for fall due to : No Fall Risks No Fall Risks - - -  Follow up Falls evaluation completed Falls evaluation completed Falls evaluation completed - -    FALL RISK PREVENTION PERTAINING TO THE HOME:  Any stairs in or around the home? Yes  If so, are there any without handrails? No  Home free of loose throw rugs in walkways, pet beds, electrical cords, etc?  Yes  Adequate lighting in your home to reduce risk of falls? Yes   ASSISTIVE DEVICES UTILIZED TO PREVENT FALLS:  Life alert? No  Use of a cane, walker or w/c? Yes  Grab bars in the bathroom? Yes  Shower chair or bench in shower? Yes  Elevated toilet seat or a handicapped toilet? No   Cognitive Function:Normal cognitive status assessed by direct observation by this Nurse Health Advisor. No abnormalities found.          Immunizations Immunization History  Administered Date(s) Administered   Fluad Quad(high Dose 65+) 09/13/2021   Influenza Split 06/24/2009, 06/22/2010   Influenza,inj,Quad PF,6+ Mos 05/28/2018, 06/12/2019   Influenza-Unspecified 07/19/2017   Pneumococcal Conjugate-13 09/12/2020   Pneumococcal Polysaccharide-23 06/24/2009    TDAP status: Due, Education has been provided regarding the importance of this vaccine. Advised may receive this vaccine at local pharmacy or Health Dept. Aware to provide a copy of the vaccination record if obtained from local pharmacy or Health Dept. Verbalized acceptance and understanding.  Flu Vaccine status: Up to date  Pneumococcal vaccine status: Up to date  Covid-19 vaccine status: Declined, Education has been provided regarding the importance of this vaccine but patient still declined. Advised may receive this vaccine at local pharmacy or Health Dept.or vaccine clinic. Aware to provide a copy of the vaccination record if obtained from local pharmacy or Health Dept. Verbalized acceptance and understanding.  Qualifies for Shingles Vaccine? Yes   Zostavax completed No   Shingrix Completed?: No.    Education has been provided regarding the importance of this vaccine. Patient has been advised to call insurance company to determine out of pocket expense if they have not yet received this vaccine. Advised may also receive vaccine at local pharmacy or Health Dept. Verbalized acceptance and understanding.  Screening Tests Health Maintenance   Topic Date Due   COVID-19 Vaccine (1) Never done   TETANUS/TDAP  Never done   Zoster Vaccines- Shingrix (1 of 2) Never done   COLONOSCOPY (Pts 45-37yrs Insurance coverage will need to be confirmed)  02/15/2015   Pneumonia Vaccine 16+ Years old (3 - PPSV23 if available, else PCV20) 09/12/2021   INFLUENZA VACCINE  Completed   Hepatitis C Screening  Completed   HPV VACCINES  Aged Out    Health Maintenance  Health Maintenance Due  Topic Date Due   COVID-19 Vaccine (1) Never done   TETANUS/TDAP  Never done   Zoster Vaccines- Shingrix (1 of 2) Never done   COLONOSCOPY (Pts 45-22yrs Insurance coverage will need to be confirmed)  02/15/2015   Pneumonia Vaccine 34+ Years old (3 - PPSV23 if available, else PCV20) 09/12/2021    Colorectal cancer screening: Type of screening: Colonoscopy. Completed 02/14/05. Repeat every 10 years - did Cologuard last year  Lung Cancer Screening: (Low Dose CT Chest recommended if Age 21-80 years, 30 pack-year currently smoking OR have quit w/in 15years.) does not qualify.    Additional Screening:  Hepatitis C Screening: does qualify; Completed 09/29/18  Vision Screening: Recommended annual ophthalmology exams for early detection of glaucoma and other disorders of the eye. Is the patient up to date with their annual eye exam?  Yes  Who is the provider or what is the name of the office in which the patient attends annual eye exams? Dr. Wyatt Portela If pt is not established with a provider, would they like to be referred to a provider to establish care? No .   Dental Screening: Recommended annual dental exams for proper oral hygiene  Community Resource Referral / Chronic Care Management: CRR required this visit?  No   CCM required this visit?  No      Plan:     I have personally reviewed and noted the following in the patients chart:   Medical and social history Use of alcohol, tobacco or illicit drugs  Current medications and supplements including  opioid prescriptions. Patient is not currently taking opioid prescriptions. Functional ability and status Nutritional status Physical activity Advanced directives List of other physicians Hospitalizations, surgeries, and ER visits in previous 12 months Vitals Screenings to include cognitive, depression, and falls Referrals and appointments  In addition, I have reviewed and discussed with patient certain preventive protocols, quality metrics, and best practice recommendations. A written personalized care plan for preventive services as well as general preventive health recommendations were provided to patient.     Dionisio David, LPN   5/80/[id number]   Nurse Notes: none

## 2021-10-09 NOTE — Patient Instructions (Signed)
Dylan Lee , Thank you for taking time to come for your Medicare Wellness Visit. I appreciate your ongoing commitment to your health goals. Please review the following plan we discussed and let me know if I can assist you in the future.   Screening recommendations/referrals: Colonoscopy: had Cologuard last year per patient Recommended yearly ophthalmology/optometry visit for glaucoma screening and checkup Recommended yearly dental visit for hygiene and checkup  Vaccinations: Influenza vaccine: 09/13/21 Pneumococcal vaccine: 09/12/20 Tdap vaccine: n/d Shingles vaccine: n/d   Covid-19: n/d  Advanced directives: no  Conditions/risks identified: none  Next appointment: Follow up in one year for your annual wellness visit. 10/10/22 @ 1pm by phone  Preventive Care 65 Years and Older, Male Preventive care refers to lifestyle choices and visits with your health care provider that can promote health and wellness. What does preventive care include? A yearly physical exam. This is also called an annual well check. Dental exams once or twice a year. Routine eye exams. Ask your health care provider how often you should have your eyes checked. Personal lifestyle choices, including: Daily care of your teeth and gums. Regular physical activity. Eating a healthy diet. Avoiding tobacco and drug use. Limiting alcohol use. Practicing safe sex. Taking low doses of aspirin every day. Taking vitamin and mineral supplements as recommended by your health care provider. What happens during an annual well check? The services and screenings done by your health care provider during your annual well check will depend on your age, overall health, lifestyle risk factors, and family history of disease. Counseling  Your health care provider may ask you questions about your: Alcohol use. Tobacco use. Drug use. Emotional well-being. Home and relationship well-being. Sexual activity. Eating  habits. History of falls. Memory and ability to understand (cognition). Work and work Statistician. Screening  You may have the following tests or measurements: Height, weight, and BMI. Blood pressure. Lipid and cholesterol levels. These may be checked every 5 years, or more frequently if you are over 54 years old. Skin check. Lung cancer screening. You may have this screening every year starting at age 13 if you have a 30-pack-year history of smoking and currently smoke or have quit within the past 15 years. Fecal occult blood test (FOBT) of the stool. You may have this test every year starting at age 38. Flexible sigmoidoscopy or colonoscopy. You may have a sigmoidoscopy every 5 years or a colonoscopy every 10 years starting at age 34. Prostate cancer screening. Recommendations will vary depending on your family history and other risks. Hepatitis C blood test. Hepatitis B blood test. Sexually transmitted disease (STD) testing. Diabetes screening. This is done by checking your blood sugar (glucose) after you have not eaten for a while (fasting). You may have this done every 1-3 years. Abdominal aortic aneurysm (AAA) screening. You may need this if you are a current or former smoker. Osteoporosis. You may be screened starting at age 6 if you are at high risk. Talk with your health care provider about your test results, treatment options, and if necessary, the need for more tests. Vaccines  Your health care provider may recommend certain vaccines, such as: Influenza vaccine. This is recommended every year. Tetanus, diphtheria, and acellular pertussis (Tdap, Td) vaccine. You may need a Td booster every 10 years. Zoster vaccine. You may need this after age 41. Pneumococcal 13-valent conjugate (PCV13) vaccine. One dose is recommended after age 15. Pneumococcal polysaccharide (PPSV23) vaccine. One dose is recommended after age 36. Talk to your  health care provider about which screenings and  vaccines you need and how often you need them. This information is not intended to replace advice given to you by your health care provider. Make sure you discuss any questions you have with your health care provider. Document Released: 09/30/2015 Document Revised: 05/23/2016 Document Reviewed: 07/05/2015 Elsevier Interactive Patient Education  2017 [place] Prevention in the Home Falls can cause injuries. They can happen to people of all ages. There are many things you can do to make your home safe and to help prevent falls. What can I do on the outside of my home? Regularly fix the edges of walkways and driveways and fix any cracks. Remove anything that might make you trip as you walk through a door, such as a raised step or threshold. Trim any bushes or trees on the path to your home. Use bright outdoor lighting. Clear any walking paths of anything that might make someone trip, such as rocks or tools. Regularly check to see if handrails are loose or broken. Make sure that both sides of any steps have handrails. Any raised decks and porches should have guardrails on the edges. Have any leaves, snow, or ice cleared regularly. Use sand or salt on walking paths during winter. Clean up any spills in your garage right away. This includes oil or grease spills. What can I do in the bathroom? Use night lights. Install grab bars by the toilet and in the tub and shower. Do not use towel bars as grab bars. Use non-skid mats or decals in the tub or shower. If you need to sit down in the shower, use a plastic, non-slip stool. Keep the floor dry. Clean up any water that spills on the floor as soon as it happens. Remove soap buildup in the tub or shower regularly. Attach bath mats securely with double-sided non-slip rug tape. Do not have throw rugs and other things on the floor that can make you trip. What can I do in the bedroom? Use night lights. Make sure that you have a light by your  bed that is easy to reach. Do not use any sheets or blankets that are too big for your bed. They should not hang down onto the floor. Have a firm chair that has side arms. You can use this for support while you get dressed. Do not have throw rugs and other things on the floor that can make you trip. What can I do in the kitchen? Clean up any spills right away. Avoid walking on wet floors. Keep items that you use a lot in easy-to-reach places. If you need to reach something above you, use a strong step stool that has a grab bar. Keep electrical cords out of the way. Do not use floor polish or wax that makes floors slippery. If you must use wax, use non-skid floor wax. Do not have throw rugs and other things on the floor that can make you trip. What can I do with my stairs? Do not leave any items on the stairs. Make sure that there are handrails on both sides of the stairs and use them. Fix handrails that are broken or loose. Make sure that handrails are as long as the stairways. Check any carpeting to make sure that it is firmly attached to the stairs. Fix any carpet that is loose or worn. Avoid having throw rugs at the top or bottom of the stairs. If you do have throw rugs, attach them  to the floor with carpet tape. Make sure that you have a light switch at the top of the stairs and the bottom of the stairs. If you do not have them, ask someone to add them for you. What else can I do to help prevent falls? Wear shoes that: Do not have high heels. Have rubber bottoms. Are comfortable and fit you well. Are closed at the toe. Do not wear sandals. If you use a stepladder: Make sure that it is fully opened. Do not climb a closed stepladder. Make sure that both sides of the stepladder are locked into place. Ask someone to hold it for you, if possible. Clearly mark and make sure that you can see: Any grab bars or handrails. First and last steps. Where the edge of each step is. Use tools that  help you move around (mobility aids) if they are needed. These include: Canes. Walkers. Scooters. Crutches. Turn on the lights when you go into a [place] area. Replace any light bulbs as soon as they burn out. Set up your furniture so you have a clear path. Avoid moving your furniture around. If any of your floors are uneven, fix them. If there are any pets around you, be aware of where they are. Review your medicines with your doctor. Some medicines can make you feel dizzy. This can increase your chance of falling. Ask your doctor what other things that you can do to help prevent falls. This information is not intended to replace advice given to you by your health care provider. Make sure you discuss any questions you have with your health care provider. Document Released: 06/30/2009 Document Revised: 02/09/2016 Document Reviewed: 10/08/2014 Elsevier Interactive Patient Education  2017 [employer].

## 2021-10-12 ENCOUNTER — Telehealth: Payer: Self-pay | Admitting: Family Medicine

## 2021-10-12 NOTE — Telephone Encounter (Signed)
Nama OT with [facility] is calling in to update provider. Pt would like to move his visit this week to Thursday of next week instead.   [phone]- secure voicemail

## 2021-10-16 DIAGNOSIS — I739 Peripheral vascular disease, unspecified: Secondary | ICD-10-CM | POA: Diagnosis not present

## 2021-10-16 DIAGNOSIS — I10 Essential (primary) hypertension: Secondary | ICD-10-CM | POA: Diagnosis not present

## 2021-10-16 DIAGNOSIS — I714 Abdominal aortic aneurysm, without rupture, unspecified: Secondary | ICD-10-CM | POA: Diagnosis not present

## 2021-10-16 DIAGNOSIS — I5023 Acute on chronic systolic (congestive) heart failure: Secondary | ICD-10-CM | POA: Diagnosis not present

## 2021-10-16 NOTE — Telephone Encounter (Signed)
Reached out to patient and spoke with wife who states that nurse called on error and she will be coming out to home next Thursday to see patient according to his wife. KW

## 2021-10-25 DIAGNOSIS — I2510 Atherosclerotic heart disease of native coronary artery without angina pectoris: Secondary | ICD-10-CM | POA: Diagnosis not present

## 2021-10-25 DIAGNOSIS — R55 Syncope and collapse: Secondary | ICD-10-CM | POA: Diagnosis not present

## 2021-10-25 DIAGNOSIS — I480 Paroxysmal atrial fibrillation: Secondary | ICD-10-CM | POA: Diagnosis not present

## 2021-10-25 DIAGNOSIS — I5023 Acute on chronic systolic (congestive) heart failure: Secondary | ICD-10-CM | POA: Diagnosis not present

## 2021-10-25 DIAGNOSIS — I714 Abdominal aortic aneurysm, without rupture, unspecified: Secondary | ICD-10-CM | POA: Diagnosis not present

## 2021-10-25 DIAGNOSIS — I428 Other cardiomyopathies: Secondary | ICD-10-CM | POA: Diagnosis not present

## 2021-10-25 DIAGNOSIS — I739 Peripheral vascular disease, unspecified: Secondary | ICD-10-CM | POA: Diagnosis not present

## 2021-10-25 DIAGNOSIS — I7121 Aneurysm of the ascending aorta, without rupture: Secondary | ICD-10-CM | POA: Diagnosis not present

## 2021-10-25 DIAGNOSIS — Z95810 Presence of automatic (implantable) cardiac defibrillator: Secondary | ICD-10-CM | POA: Diagnosis not present

## 2021-10-25 DIAGNOSIS — G4733 Obstructive sleep apnea (adult) (pediatric): Secondary | ICD-10-CM | POA: Diagnosis not present

## 2021-10-26 ENCOUNTER — Other Ambulatory Visit: Payer: Self-pay | Admitting: Family Medicine

## 2021-10-26 DIAGNOSIS — E785 Hyperlipidemia, unspecified: Secondary | ICD-10-CM

## 2021-10-26 DIAGNOSIS — J449 Chronic obstructive pulmonary disease, unspecified: Secondary | ICD-10-CM

## 2021-10-26 NOTE — Telephone Encounter (Signed)
Call to pharmacy- verified Rx RF on file- not needed at this time. Requested Prescriptions  Pending Prescriptions Disp Refills   SYMBICORT 160-4.5 MCG/ACT inhaler [Pharmacy Med Name: Symbicort 160-4.5 MCG/ACT Inhalation Aerosol] 11 g 0    Sig: Inhale 2 puffs by mouth twice daily     Pulmonology:  Combination Products Passed - 10/26/2021  4:10 PM      Passed - Valid encounter within last 12 months    Recent Outpatient Visits          2 weeks ago Pre-diabetes   [facility] Birdie Sons, MD   1 month ago Pneumonia due to COVID-19 virus   [place], Kirstie Peri, MD   1 month ago [place], Donald E, MD   5 months ago Contusion of buttock, initial encounter   [facility] Birdie Sons, MD   6 months ago Depressive disorder   [place], Kirstie Peri, MD      Future Appointments            In 3 months [place], Kirstie Peri, MD [facility], [place]

## 2021-11-06 DIAGNOSIS — I5022 Chronic systolic (congestive) heart failure: Secondary | ICD-10-CM | POA: Diagnosis not present

## 2021-11-06 DIAGNOSIS — I7121 Aneurysm of the ascending aorta, without rupture: Secondary | ICD-10-CM | POA: Diagnosis not present

## 2021-11-08 DIAGNOSIS — R0902 Hypoxemia: Secondary | ICD-10-CM | POA: Diagnosis not present

## 2021-11-08 DIAGNOSIS — R0602 Shortness of breath: Secondary | ICD-10-CM | POA: Diagnosis not present

## 2021-11-08 DIAGNOSIS — R059 Cough, unspecified: Secondary | ICD-10-CM | POA: Diagnosis not present

## 2021-11-08 DIAGNOSIS — G4733 Obstructive sleep apnea (adult) (pediatric): Secondary | ICD-10-CM | POA: Diagnosis not present

## 2021-11-08 DIAGNOSIS — J449 Chronic obstructive pulmonary disease, unspecified: Secondary | ICD-10-CM | POA: Diagnosis not present

## 2021-11-08 DIAGNOSIS — R053 Chronic cough: Secondary | ICD-10-CM | POA: Diagnosis not present

## 2021-11-08 DIAGNOSIS — R69 Illness, unspecified: Secondary | ICD-10-CM | POA: Diagnosis not present

## 2021-11-11 ENCOUNTER — Other Ambulatory Visit: Payer: Self-pay | Admitting: Family Medicine

## 2021-11-11 DIAGNOSIS — F32A Depression, unspecified: Secondary | ICD-10-CM

## 2021-11-13 ENCOUNTER — Ambulatory Visit (INDEPENDENT_AMBULATORY_CARE_PROVIDER_SITE_OTHER): Payer: Medicare HMO | Admitting: Family Medicine

## 2021-11-13 ENCOUNTER — Other Ambulatory Visit: Payer: Self-pay

## 2021-11-13 ENCOUNTER — Ambulatory Visit: Payer: Self-pay | Admitting: *Deleted

## 2021-11-13 ENCOUNTER — Encounter: Payer: Self-pay | Admitting: Family Medicine

## 2021-11-13 VITALS — BP 141/92 | HR 78 | Temp 98.5°F | Resp 20 | Wt 224.0 lb

## 2021-11-13 DIAGNOSIS — R051 Acute cough: Secondary | ICD-10-CM | POA: Diagnosis not present

## 2021-11-13 DIAGNOSIS — J301 Allergic rhinitis due to pollen: Secondary | ICD-10-CM | POA: Diagnosis not present

## 2021-11-13 DIAGNOSIS — J441 Chronic obstructive pulmonary disease with (acute) exacerbation: Secondary | ICD-10-CM

## 2021-11-13 MED ORDER — MONTELUKAST SODIUM 10 MG PO TABS: 10.0000 mg | ORAL_TABLET | Freq: Every day | ORAL | 3 refills | Status: DC

## 2021-11-13 MED ORDER — PREDNISONE 10 MG PO TABS: ORAL_TABLET | ORAL | 0 refills | Status: AC

## 2021-11-13 MED ORDER — HYDROCODONE BIT-HOMATROP MBR 5-1.5 MG/5ML PO SOLN: 5.0000 mL | Freq: Three times a day (TID) | ORAL | 0 refills | Status: DC | PRN

## 2021-11-13 NOTE — Progress Notes (Signed)
I,Roshena L Chambers,acting as a scribe for Lelon Huh, MD.,have documented all relevant documentation on the behalf of Lelon Huh, MD,as directed by  Lelon Huh, MD while in the presence of Lelon Huh, MD.   Established patient visit   Patient: Dylan Lee   DOB: [birth year]   66 y.o. Male  MRN: [id number] Visit Date: 11/13/2021  Today's healthcare provider: Lelon Huh, MD   Chief Complaint  Patient presents with   Cough   Subjective    Cough This is a new problem. Episode onset: 1 week ago. Associated symptoms include postnasal drip, shortness of breath and wheezing. Pertinent negatives include no chest pain, chills or fever. The symptoms are aggravated by lying down.   Patient was seen at [facility] last week. He was prescribed Prednisone 10mg  once a day  and Augmentin. Patient is currently using 2 liters O2 because O2 sat have been 88 % room air- states with O2 sat has come back up into the 90's. Has also had persistent nasal congestion and clear drainage for a few weeks.   Medications: Outpatient Medications Prior to Visit  Medication Sig   albuterol (PROVENTIL) (2.5 MG/3ML) 0.083% nebulizer solution Inhale 3 mLs into the lungs every 6 (six) hours as needed for wheezing.   aspirin 81 MG tablet Take 81 mg by mouth daily.   ipratropium (ATROVENT HFA) 17 MCG/ACT inhaler Inhale 2 puffs into the lungs every 8 (eight) hours.   methotrexate 50 MG/2ML injection INJECT 0.6 ML INTO THE SKIN ONCE A WEEK   metoprolol succinate (TOPROL-XL) 25 MG 24 hr tablet Take 1 tablet (25 mg total) by mouth daily.   rosuvastatin (CRESTOR) 40 MG tablet Take 1 tablet by mouth once daily   sacubitril-valsartan (ENTRESTO) 97-103 MG Take 1 tablet by mouth 2 (two) times daily.   SYMBICORT 160-4.5 MCG/ACT inhaler INHALE 2 PUFFS INTO LUNGS TWICE DAILY   tamsulosin (FLOMAX) 0.4 MG CAPS capsule Take 1 capsule (0.4 mg total) by mouth daily.   torsemide (DEMADEX) 20 MG tablet Take 10 mg by  mouth daily.   venlafaxine XR (EFFEXOR-XR) 150 MG 24 hr capsule Take 1 capsule (150 mg total) by mouth daily. Take in addition to one 75mg  capsule daily   No facility-administered medications prior to visit.    Review of Systems  Constitutional:  Negative for appetite change, chills and fever.  HENT:  Positive for postnasal drip.   Respiratory:  Positive for cough (productive with white sputum), shortness of breath and wheezing. Negative for chest tightness.   Cardiovascular:  Positive for leg swelling. Negative for chest pain and palpitations.  Gastrointestinal:  Negative for abdominal pain, nausea and vomiting.  Psychiatric/Behavioral:  Positive for sleep disturbance.       Objective    BP (!) 141/92 (BP Location: Left Arm, Patient Position: Sitting, Cuff Size: Large)    Pulse 78    Temp 98.5 F (36.9 C) (Oral)    Resp 20    Wt 224 lb (101.6 kg)    SpO2 96% Comment: 2L 02 nasal canula   BMI 31.24 kg/m    Physical Exam   General Appearance:    Mildly obese male, alert, cooperative, in no acute distress  HENT:   sinuses nontender and nasal mucosa pale and congested  Eyes:    PERRL, conjunctiva/corneas clear, EOM's intact       Lungs:     Scattered expiratory wheezes, respirations unlabored  Heart:    Normal heart rate. Normal rhythm. No  murmurs, rubs, or gallops.    Neurologic:   Awake, alert, oriented x 3. No apparent focal neurological           defect.        Assessment & Plan      1. Acute cough Secondary to COPD exacerbation and  post nasal drainage from allergies.  - HYDROcodone bit-homatropine (HYCODAN) 5-1.5 MG/5ML syrup; Take 5 mLs by mouth every 8 (eight) hours as needed for cough.  Dispense: 120 mL; Refill: 0  2. COPD with acute exacerbation (HCC) Increase to  predniSONE (DELTASONE) 10 MG tablet; 6 tablets for 1 day, then 5 for 1 day, then 4 for 1 day, then 3 for 1 day, then 2 for 1 day then 1 for 1 day.  Dispense: 21 tablet, then complete 10mg  daily prednisone  prescribed by Dr. Raul Del.   3. Seasonal allergic rhinitis due to pollen Recommend OTC nasal saline irrigation or spray.  - montelukast (SINGULAIR) 10 MG tablet; Take 1 tablet (10 mg total) by mouth at bedtime.  Dispense: 30 tablet; Refill: 3        The entirety of the information documented in the History of Present Illness, Review of Systems and Physical Exam were personally obtained by me. Portions of this information were initially documented by the CMA and reviewed by me for thoroughness and accuracy.     Lelon Huh, MD  [facility] [phone] (phone) [phone] (fax)  [place]

## 2021-11-13 NOTE — Telephone Encounter (Signed)
°  Chief Complaint: cough- making COPD worse Symptoms: SOB with cough, unable to lay down Frequency: worse 1 week Pertinent Negatives: Patient denies fever Disposition: [] ED /[] Urgent Care (no appt availability in office) / [] Appointment(In office/virtual)/ []  [facility]/ [] Home Care/ [] Refused Recommended Disposition /[] [facility]/ []  Follow-up with PCP Additional Notes: Patient  and wife calling- patient has increased sinus drainage with cough- causing SOB. Patient was seen at [facility] last week. Patient is currently using 2 liters O2 because O2 sat 88%- wife states with O2 it will come back up into the 90's. O2 now 95%

## 2021-11-13 NOTE — Telephone Encounter (Signed)
°  Call to office- they are awaiting provider response for appointment request and will call patient back. Wife asked if they can call her cell number [phone] Reason for Disposition  [1] Longstanding difficulty breathing (e.g., CHF, COPD, emphysema) AND [2] WORSE than normal  Answer Assessment - Initial Assessment Questions 1. RESPIRATORY STATUS: "Describe your breathing?" (e.g., wheezing, shortness of breath, unable to speak, severe coughing)      Severe coughing 2. ONSET: "When did this breathing problem begin?"      Chronic cough- worse last week 3. PATTERN "Does the difficult breathing come and go, or has it been constant since it started?"      Comes and goes- worse in am- unable to lay down 4. SEVERITY: "How bad is your breathing?" (e.g., mild, moderate, severe)    - MILD: No SOB at rest, mild SOB with walking, speaks normally in sentences, can lie down, no retractions, pulse < 100.    - MODERATE: SOB at rest, SOB with minimal exertion and prefers to sit, cannot lie down flat, speaks in phrases, mild retractions, audible wheezing, pulse 100-120.    - SEVERE: Very SOB at rest, speaks in single words, struggling to breathe, sitting hunched forward, retractions, pulse > 120      moderate 5. RECURRENT SYMPTOM: "Have you had difficulty breathing before?" If Yes, ask: "When was the last time?" and "What happened that time?"      yes 6. CARDIAC HISTORY: "Do you have any history of heart disease?" (e.g., heart attack, angina, bypass surgery, angioplasty)      Hx heart disease 7. LUNG HISTORY: "Do you have any history of lung disease?"  (e.g., pulmonary embolus, asthma, emphysema)     COPD 8. CAUSE: "What do you think is causing the breathing problem?"      Sinus drainage 9. OTHER SYMPTOMS: "Do you have any other symptoms? (e.g., dizziness, runny nose, cough, chest pain, fever)     Sinus drainage 10. O2 SATURATION MONITOR:  "Do you use an oxygen saturation monitor (pulse oximeter) at  home?" If Yes, "What is your reading (oxygen level) today?" "What is your usual oxygen saturation reading?" (e.g., 95%)       88%- normal was 95%, O2- 2 liters- now 95% 11. PREGNANCY: "Is there any chance you are pregnant?" "When was your last menstrual period?"       *No Answer* 12. TRAVEL: "Have you traveled out of the country in the last month?" (e.g., travel history, exposures)       *No Answer*  Protocols used: Breathing Difficulty-A-AH

## 2021-12-06 DIAGNOSIS — G4733 Obstructive sleep apnea (adult) (pediatric): Secondary | ICD-10-CM

## 2021-12-06 DIAGNOSIS — I2510 Atherosclerotic heart disease of native coronary artery without angina pectoris: Secondary | ICD-10-CM

## 2021-12-06 DIAGNOSIS — I130 Hypertensive heart and chronic kidney disease with heart failure and stage 1 through stage 4 chronic kidney disease, or unspecified chronic kidney disease: Secondary | ICD-10-CM

## 2021-12-06 DIAGNOSIS — I5022 Chronic systolic (congestive) heart failure: Secondary | ICD-10-CM

## 2021-12-06 DIAGNOSIS — F32A Depression, unspecified: Secondary | ICD-10-CM

## 2021-12-06 DIAGNOSIS — N1831 Chronic kidney disease, stage 3a: Secondary | ICD-10-CM

## 2021-12-06 DIAGNOSIS — J1282 Pneumonia due to coronavirus disease 2019: Secondary | ICD-10-CM

## 2021-12-06 DIAGNOSIS — M069 Rheumatoid arthritis, unspecified: Secondary | ICD-10-CM

## 2021-12-06 DIAGNOSIS — J440 Chronic obstructive pulmonary disease with acute lower respiratory infection: Secondary | ICD-10-CM

## 2021-12-06 DIAGNOSIS — U071 COVID-19: Secondary | ICD-10-CM

## 2021-12-06 DIAGNOSIS — J449 Chronic obstructive pulmonary disease, unspecified: Secondary | ICD-10-CM | POA: Diagnosis not present

## 2021-12-21 DIAGNOSIS — J449 Chronic obstructive pulmonary disease, unspecified: Secondary | ICD-10-CM | POA: Diagnosis not present

## 2021-12-21 DIAGNOSIS — R0609 Other forms of dyspnea: Secondary | ICD-10-CM | POA: Diagnosis not present

## 2021-12-21 DIAGNOSIS — G4733 Obstructive sleep apnea (adult) (pediatric): Secondary | ICD-10-CM | POA: Diagnosis not present

## 2021-12-21 DIAGNOSIS — Z9981 Dependence on supplemental oxygen: Secondary | ICD-10-CM | POA: Diagnosis not present

## 2022-01-06 DIAGNOSIS — J449 Chronic obstructive pulmonary disease, unspecified: Secondary | ICD-10-CM | POA: Diagnosis not present

## 2022-01-22 DIAGNOSIS — I2510 Atherosclerotic heart disease of native coronary artery without angina pectoris: Secondary | ICD-10-CM | POA: Diagnosis not present

## 2022-01-22 DIAGNOSIS — U071 COVID-19: Secondary | ICD-10-CM | POA: Diagnosis not present

## 2022-01-22 DIAGNOSIS — I428 Other cardiomyopathies: Secondary | ICD-10-CM | POA: Diagnosis not present

## 2022-01-22 DIAGNOSIS — I10 Essential (primary) hypertension: Secondary | ICD-10-CM | POA: Diagnosis not present

## 2022-01-22 DIAGNOSIS — Z95810 Presence of automatic (implantable) cardiac defibrillator: Secondary | ICD-10-CM | POA: Diagnosis not present

## 2022-01-22 DIAGNOSIS — I7121 Aneurysm of the ascending aorta, without rupture: Secondary | ICD-10-CM | POA: Diagnosis not present

## 2022-01-22 DIAGNOSIS — I471 Supraventricular tachycardia: Secondary | ICD-10-CM | POA: Diagnosis not present

## 2022-01-22 DIAGNOSIS — Z79899 Other long term (current) drug therapy: Secondary | ICD-10-CM | POA: Diagnosis not present

## 2022-01-22 DIAGNOSIS — J449 Chronic obstructive pulmonary disease, unspecified: Secondary | ICD-10-CM | POA: Diagnosis not present

## 2022-01-22 DIAGNOSIS — I5023 Acute on chronic systolic (congestive) heart failure: Secondary | ICD-10-CM | POA: Diagnosis not present

## 2022-01-22 DIAGNOSIS — E782 Mixed hyperlipidemia: Secondary | ICD-10-CM | POA: Diagnosis not present

## 2022-01-22 DIAGNOSIS — G4733 Obstructive sleep apnea (adult) (pediatric): Secondary | ICD-10-CM | POA: Diagnosis not present

## 2022-01-22 DIAGNOSIS — I480 Paroxysmal atrial fibrillation: Secondary | ICD-10-CM | POA: Diagnosis not present

## 2022-01-22 DIAGNOSIS — Z720 Tobacco use: Secondary | ICD-10-CM | POA: Diagnosis not present

## 2022-01-24 DIAGNOSIS — I480 Paroxysmal atrial fibrillation: Secondary | ICD-10-CM | POA: Diagnosis not present

## 2022-02-05 DIAGNOSIS — J449 Chronic obstructive pulmonary disease, unspecified: Secondary | ICD-10-CM | POA: Diagnosis not present

## 2022-02-16 ENCOUNTER — Encounter: Payer: Medicare HMO | Admitting: Family Medicine

## 2022-02-20 ENCOUNTER — Encounter: Payer: Medicare HMO | Admitting: Family Medicine

## 2022-03-08 DIAGNOSIS — J449 Chronic obstructive pulmonary disease, unspecified: Secondary | ICD-10-CM | POA: Diagnosis not present

## 2022-03-12 ENCOUNTER — Telehealth: Payer: Self-pay | Admitting: Family Medicine

## 2022-03-12 DIAGNOSIS — J301 Allergic rhinitis due to pollen: Secondary | ICD-10-CM

## 2022-03-12 MED ORDER — MONTELUKAST SODIUM 10 MG PO TABS: 10.0000 mg | ORAL_TABLET | Freq: Every day | ORAL | 0 refills | Status: DC

## 2022-03-26 DIAGNOSIS — G4733 Obstructive sleep apnea (adult) (pediatric): Secondary | ICD-10-CM | POA: Diagnosis not present

## 2022-03-26 DIAGNOSIS — R0609 Other forms of dyspnea: Secondary | ICD-10-CM | POA: Diagnosis not present

## 2022-03-26 DIAGNOSIS — J449 Chronic obstructive pulmonary disease, unspecified: Secondary | ICD-10-CM | POA: Diagnosis not present

## 2022-03-26 DIAGNOSIS — Z9981 Dependence on supplemental oxygen: Secondary | ICD-10-CM | POA: Diagnosis not present

## 2022-03-26 DIAGNOSIS — Z9989 Dependence on other enabling machines and devices: Secondary | ICD-10-CM | POA: Diagnosis not present

## 2022-04-07 DIAGNOSIS — J449 Chronic obstructive pulmonary disease, unspecified: Secondary | ICD-10-CM | POA: Diagnosis not present

## 2022-04-17 ENCOUNTER — Other Ambulatory Visit: Payer: Self-pay | Admitting: Physician Assistant

## 2022-04-17 DIAGNOSIS — J301 Allergic rhinitis due to pollen: Secondary | ICD-10-CM

## 2022-04-24 DIAGNOSIS — Z95810 Presence of automatic (implantable) cardiac defibrillator: Secondary | ICD-10-CM | POA: Diagnosis not present

## 2022-04-24 DIAGNOSIS — I2510 Atherosclerotic heart disease of native coronary artery without angina pectoris: Secondary | ICD-10-CM | POA: Diagnosis not present

## 2022-04-24 DIAGNOSIS — U071 COVID-19: Secondary | ICD-10-CM | POA: Diagnosis not present

## 2022-04-24 DIAGNOSIS — Z720 Tobacco use: Secondary | ICD-10-CM | POA: Diagnosis not present

## 2022-04-24 DIAGNOSIS — I480 Paroxysmal atrial fibrillation: Secondary | ICD-10-CM | POA: Diagnosis not present

## 2022-04-24 DIAGNOSIS — I7121 Aneurysm of the ascending aorta, without rupture: Secondary | ICD-10-CM | POA: Diagnosis not present

## 2022-04-24 DIAGNOSIS — I428 Other cardiomyopathies: Secondary | ICD-10-CM | POA: Diagnosis not present

## 2022-04-24 DIAGNOSIS — I5023 Acute on chronic systolic (congestive) heart failure: Secondary | ICD-10-CM | POA: Diagnosis not present

## 2022-04-24 DIAGNOSIS — R0781 Pleurodynia: Secondary | ICD-10-CM | POA: Diagnosis not present

## 2022-04-24 DIAGNOSIS — I471 Supraventricular tachycardia: Secondary | ICD-10-CM | POA: Diagnosis not present

## 2022-04-24 DIAGNOSIS — G4733 Obstructive sleep apnea (adult) (pediatric): Secondary | ICD-10-CM | POA: Diagnosis not present

## 2022-04-24 DIAGNOSIS — I10 Essential (primary) hypertension: Secondary | ICD-10-CM | POA: Diagnosis not present

## 2022-04-24 DIAGNOSIS — J449 Chronic obstructive pulmonary disease, unspecified: Secondary | ICD-10-CM | POA: Diagnosis not present

## 2022-05-03 ENCOUNTER — Ambulatory Visit
Admission: RE | Admit: 2022-05-03 | Discharge: 2022-05-03 | Disposition: A | Discharge status: Home / Self Care | Payer: Medicare HMO | Source: Ambulatory Visit | Attending: Student | Admitting: Student

## 2022-05-03 ENCOUNTER — Other Ambulatory Visit: Payer: Self-pay | Admitting: Student

## 2022-05-03 DIAGNOSIS — R55 Syncope and collapse: Secondary | ICD-10-CM | POA: Insufficient documentation

## 2022-05-07 ENCOUNTER — Encounter: Payer: Self-pay | Admitting: Family Medicine

## 2022-05-07 ENCOUNTER — Ambulatory Visit (INDEPENDENT_AMBULATORY_CARE_PROVIDER_SITE_OTHER): Payer: Medicare HMO | Admitting: Family Medicine

## 2022-05-07 VITALS — BP 153/83 | HR 57 | Temp 98.5°F | Ht 70.98 in | Wt 224.0 lb

## 2022-05-07 DIAGNOSIS — Z0000 Encounter for general adult medical examination without abnormal findings: Secondary | ICD-10-CM | POA: Diagnosis not present

## 2022-05-07 DIAGNOSIS — E785 Hyperlipidemia, unspecified: Secondary | ICD-10-CM

## 2022-05-07 DIAGNOSIS — Z1211 Encounter for screening for malignant neoplasm of colon: Secondary | ICD-10-CM

## 2022-05-07 DIAGNOSIS — Z720 Tobacco use: Secondary | ICD-10-CM

## 2022-05-07 DIAGNOSIS — L4050 Arthropathic psoriasis, unspecified: Secondary | ICD-10-CM

## 2022-05-07 DIAGNOSIS — Z23 Encounter for immunization: Secondary | ICD-10-CM

## 2022-05-07 DIAGNOSIS — Z125 Encounter for screening for malignant neoplasm of prostate: Secondary | ICD-10-CM

## 2022-05-07 DIAGNOSIS — I5022 Chronic systolic (congestive) heart failure: Secondary | ICD-10-CM | POA: Diagnosis not present

## 2022-05-07 DIAGNOSIS — R7303 Prediabetes: Secondary | ICD-10-CM

## 2022-05-07 NOTE — Progress Notes (Unsigned)
I,Roshena L Chambers,acting as a scribe for Lelon Huh, MD.,have documented all relevant documentation on the behalf of Lelon Huh, MD,as directed by  Lelon Huh, MD while in the presence of Lelon Huh, MD.    Complete physical exam   Patient: Dylan Lee   DOB: 01-06-1956   66 y.o. Male  MRN: [id number] Visit Date: 05/07/2022  Today's healthcare provider: Lelon Huh, MD   Chief Complaint  Patient presents with   Annual Exam   Subjective    SHERRILL MCKAMIE is a 66 y.o. male who presents today for a complete physical exam.  He reports consuming a general diet. The patient does not participate in regular exercise at present. He generally feels fairly well. He reports sleeping fairly well. He does not have additional problems to discuss today.  Had AWV with [facility] on 10/06/2021.  He continues on methotrexate for psoriatic arthritis. Was previously managed by Dr. Annalee Genta, then Dr. Posey Pronto, but has not returned to see him since 2021.   He continues regular follow up with Dr. Clayborn Bigness for CHF. He reports home blood pressure labile, but usually better than today's office reading.   Past Medical History:  Diagnosis Date   CHF (congestive heart failure) (HCC)    Hepatitis C    Hep C treated    History of chicken pox    History of measles    History of mumps    Hyperlipidemia    Hypertension    Kidney stones    Shingles 09/06/2015   Past Surgical History:  Procedure Laterality Date   BUNIONECTOMY     CARDIAC CATHETERIZATION N/A 03/02/2015   Procedure: Right and Left Heart Cath;  Surgeon: Yolonda Kida, MD;  Location: [facility];  Service: Cardiovascular;  Laterality: N/A;   DENTAL SURGERY     FOOT SURGERY Right    IMPLANTABLE CARDIOVERTER DEFIBRILLATOR (ICD) GENERATOR CHANGE Right 06/03/2015   Procedure: ICD generator insertion ;  Surgeon: Marzetta Board, MD;  Location: [facility];  Service: Cardiovascular;  Laterality: Right;   LITHOTRIPSY      sleep study  07/2010   severe OSA, snoring. Bilevel pressure at 19/14. Jiengel. 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 30+ years ago  Vaping Use   Vaping Use: Never used  Substance and Sexual Activity   Alcohol use: No    Alcohol/week: 0.0 standard drinks of alcohol   Drug use: Yes   Sexual activity: Not on file  Other Topics Concern   Not on file  Social History Narrative   Not on file   Social Determinants of Health   Financial Resource Strain: Low Risk  (10/09/2021)   Overall Financial Resource Strain (CARDIA)    Difficulty of Paying Living Expenses: Not hard at all  Food Insecurity: No Food Insecurity (10/09/2021)   Hunger  Vital Sign    Worried About Charity fundraiser in the Last Year: Never true    Ran Out of Food in the Last Year: Never true  Transportation Needs: No Transportation Needs (10/09/2021)   PRAPARE - Hydrologist (Medical): No    Lack of Transportation (Non-Medical): No  Physical Activity: Insufficiently Active (10/09/2021)   Exercise Vital Sign    Days of Exercise per Week: 7 days    Minutes of Exercise per Session: 10 min  Stress: No Stress Concern Present (10/09/2021)   [place]    Feeling of Stress : Not at all  Social Connections: Socially Isolated (10/09/2021)   Social Connection and Isolation Panel  [NHANES]    Frequency of Communication with Friends and Family: Once a week    Frequency of Social Gatherings with Friends and Family: Once a week    Attends Religious Services: Never    Marine scientist or Organizations: No    Attends Archivist Meetings: Never    Marital Status: Married  Human resources officer Violence: Not At Risk (10/09/2021)   Humiliation, Afraid, Rape, and Kick questionnaire    Fear of Current or Ex-Partner: No    Emotionally Abused: No    Physically Abused: No    Sexually Abused: No   Family Status  Relation Name Status   Mother  Deceased   Father  Deceased at age 56   Sister  50   Brother  Deceased at age 28       CHF   Brother  Deceased   Family History  Problem Relation Age of Onset   Hypertension Mother    Bipolar disorder Mother    Ovarian cancer Mother    Heart disease Mother    Diabetes Father    Alcohol abuse Father    CAD Father    Depression Father    Alcohol abuse Sister    Congestive Heart Failure Brother    Alcohol abuse Brother    Liver cancer Brother    No Known Allergies  Patient Care Team: Birdie Sons, MD as PCP - General (Family Medicine) Manya Silvas, MD (Inactive) (Gastroenterology) Yolonda Kida, MD as Consulting Physician (Cardiology) Marlowe Sax, MD as Referring Physician (Internal Medicine) Ocie Doyne, OD (Optometry)   Medications: Outpatient Medications Prior to Visit  Medication Sig   albuterol (PROVENTIL) (2.5 MG/3ML) 0.083% nebulizer solution Inhale 3 mLs into the lungs every 6 (six) hours as needed for wheezing.   aspirin 81 MG tablet Take 81 mg by mouth daily.   HYDROcodone bit-homatropine (HYCODAN) 5-1.5 MG/5ML syrup Take 5 mLs by mouth every 8 (eight) hours as needed for cough.   ipratropium (ATROVENT HFA) 17 MCG/ACT inhaler Inhale 2 puffs into the lungs every 8 (eight) hours.   methotrexate 50 MG/2ML injection INJECT 0.6 ML INTO THE SKIN ONCE A WEEK   metoprolol  succinate (TOPROL-XL) 25 MG 24 hr tablet Take 1 tablet (25 mg total) by mouth daily.   montelukast (SINGULAIR) 10 MG tablet TAKE 1 TABLET BY MOUTH AT BEDTIME   rosuvastatin (CRESTOR) 40 MG tablet Take 1 tablet by mouth once daily   sacubitril-valsartan (ENTRESTO) 97-103 MG Take 1 tablet by mouth 2 (two) times daily.   SYMBICORT 160-4.5 MCG/ACT inhaler INHALE 2 PUFFS INTO LUNGS TWICE DAILY   tamsulosin (FLOMAX) 0.4 MG CAPS capsule Take 1 capsule (0.4 mg total) by mouth daily.   torsemide (  DEMADEX) 20 MG tablet Take 10 mg by mouth daily.   venlafaxine XR (EFFEXOR-XR) 150 MG 24 hr capsule Take 1 capsule (150 mg total) by mouth daily. Take in addition to one 17m capsule daily   No facility-administered medications prior to visit.    Review of Systems  Constitutional:  Negative for appetite change, chills, fatigue and fever.  HENT:  Negative for congestion, ear pain, hearing loss, nosebleeds and trouble swallowing.   Eyes:  Negative for pain and visual disturbance.  Respiratory:  Negative for cough, chest tightness and shortness of breath.   Cardiovascular:  Negative for chest pain, palpitations and leg swelling.  Gastrointestinal:  Negative for abdominal pain, blood in stool, constipation, diarrhea, nausea and vomiting.  Endocrine: Negative for polydipsia, polyphagia and polyuria.  Genitourinary:  Negative for dysuria and flank pain.  Musculoskeletal:  Negative for arthralgias, back pain, joint swelling, myalgias and neck stiffness.  Skin:  Negative for color change, rash and wound.  Neurological:  Negative for dizziness, tremors, seizures, speech difficulty, weakness, light-headedness and headaches.  Psychiatric/Behavioral:  Negative for behavioral problems, confusion, decreased concentration, dysphoric mood and sleep disturbance. The patient is not nervous/anxious.   All other systems reviewed and are negative.   {Labs  Heme  Chem  Endocrine  Serology  Results Review  (optional):23779}  Objective     BP (!) 153/83 (BP Location: Right Arm, Patient Position: Sitting, Cuff Size: Large)   Pulse (!) 57   Temp 98.5 F (36.9 C) (Oral)   Ht 5' 10.98" (1.803 m)   Wt 224 lb (101.6 kg)   SpO2 97% Comment: room air  BMI 31.26 kg/m  {Show previous vital signs (optional):23777}  Today's Vitals   05/07/22 1418 05/07/22 1424  BP: (!) 149/79 (!) 153/83  Pulse: (!) 55 (!) 57  Temp: 98.5 F (36.9 C)   TempSrc: Oral   SpO2: 97%   Weight: 224 lb (101.6 kg)   Height: 5' 10.98" (1.803 m)    Body mass index is 31.26 kg/m.   Physical Exam    General Appearance:    Mildly obese male. Alert, cooperative, in no acute distress, appears stated age  Head:    Normocephalic, without obvious abnormality, atraumatic  Eyes:    PERRL, conjunctiva/corneas clear, EOM's intact, fundi    benign, both eyes       Ears:    Normal TM's and external ear canals, both ears  Nose:   Nares normal, septum midline, mucosa normal, no drainage   or sinus tenderness  Throat:   Lips, mucosa, and tongue normal; teeth and gums normal  Neck:   Supple, symmetrical, trachea midline, no adenopathy;       thyroid:  No enlargement/tenderness/nodules; no carotid   bruit or JVD  Back:     Symmetric, no curvature, ROM normal, no CVA tenderness  Lungs:     Clear to auscultation bilaterally, respirations unlabored  Chest wall:    No tenderness or deformity  Heart:    Bradycardic. Normal rhythm. No murmurs, rubs, or gallops.  S1 and S2 normal  Abdomen:     Soft, non-tender, bowel sounds active all four quadrants,    no masses, no organomegaly  Genitalia:    deferred  Rectal:    deferred  Extremities:   All extremities are intact. No cyanosis or edema  Pulses:   2+ and symmetric all extremities  Skin:   Diffuse scaling and peeling c/w mild psoriatic arthritis.   Lymph nodes:   Cervical, supraclavicular,  and axillary nodes normal  Neurologic:   CNII-XII intact. Normal strength, sensation and  reflexes      throughout     Last depression screening scores    10/09/2021    1:52 PM 10/06/2021    1:56 PM 04/03/2021    1:22 PM  PHQ 2/9 Scores  PHQ - 2 Score 0 3 2  PHQ- 9 Score '8 11 6   ' Last fall risk screening    10/09/2021    1:56 PM  [place] in the past year? 1  Number falls in past yr: 1  Injury with Fall? 1  Risk for fall due to : History of fall(s)  Follow up Falls prevention discussed   Last Audit-C alcohol use screening    10/09/2021    1:52 PM  Alcohol Use Disorder Test (AUDIT)  1. How often do you have a drink containing alcohol? 0  2. How many drinks containing alcohol do you have on a typical day when you are drinking? 0  3. How often do you have six or more drinks on one occasion? 0  AUDIT-C Score 0   A score of 3 or more in women, and 4 or more in men indicates increased risk for alcohol abuse, EXCEPT if all of the points are from question 1   No results found for any visits on 05/07/22.  Assessment & Plan    Routine Health Maintenance and Physical Exam  Exercise Activities and Dietary recommendations  Goals      DIET - EAT MORE FRUITS AND VEGETABLES     Exercise 3x per week (30 min per time)     Recommend to exercise for 3 days a week for at least 30 minutes at a time.         Immunization History  Administered Date(s) Administered   Fluad Quad(high Dose 65+) 09/13/2021   Influenza Split 06/24/2009, 06/22/2010   Influenza,inj,Quad PF,6+ Mos 05/28/2018, 06/12/2019   Influenza-Unspecified 07/19/2017   Pneumococcal Conjugate-13 09/12/2020   Pneumococcal Polysaccharide-23 06/24/2009    Health Maintenance  Topic Date Due   COVID-19 Vaccine (1) Never done   TETANUS/TDAP  Never done   Zoster Vaccines- Shingrix (1 of 2) Never done   COLONOSCOPY (Pts 45-65yr Insurance coverage will need to be confirmed)  02/15/2015   Pneumonia Vaccine 67 Years old (3 - PPSV23 or PCV20) 09/12/2021   INFLUENZA VACCINE  04/17/2022   Hepatitis C  Screening  Completed   HPV VACCINES  Aged Out    Discussed health benefits of physical activity, and encouraged him to engage in regular exercise appropriate for his age and condition.   2. Need for vaccination against Streptococcus pneumoniae  - Pneumococcal polysaccharide vaccine 23-valent (Preferred)  3. Prostate cancer screening  - PSA Total (Reflex To Free) ([facility] only)  4. Colon cancer screening He declines colonoscopy, but states [employer] sends him kit for stool test towards the end of every year. He expects this to be sent to him soon. If not he is to call back of [employer].   5. Chronic systolic heart failure ([place] Fairly well compensated, continue current meds and routine follow up with cardiology.   6. Psoriatic arthritis ([place] Doing well on methotrexate that was initially prescribed by Dr. BAnnalee Gentawho has moved away. Advised we will need to get him back in with new rheumatologist in the near future.   7. Pre-diabetes  - Hemoglobin A1c  8. Tobacco use Encouraged smoking cessation.   9.  Dyslipidemia (high LDL; low HDL) He is tolerating rosuvastatin well with no adverse effects.   - CBC - Comprehensive metabolic panel - Lipid panel     The entirety of the information documented in the History of Present Illness, Review of Systems and Physical Exam were personally obtained by me. Portions of this information were initially documented by the CMA and reviewed by me for thoroughness and accuracy.     Lelon Huh, MD  [facility] [phone] (phone) [phone] (fax)  [place]

## 2022-05-07 NOTE — Patient Instructions (Signed)
Please review the attached list of medications and notify my office if there are any errors.   The CDC recommends two doses of Shingrix (the shingles vaccine) separated by 2 to 6 months for adults age 67 years and older. I recommend checking with your pharmacy plan regarding coverage for this vaccine.   I recommend getting the updated Covid vaccine at your local pharmacy when they come out next month

## 2022-05-08 DIAGNOSIS — J449 Chronic obstructive pulmonary disease, unspecified: Secondary | ICD-10-CM | POA: Diagnosis not present

## 2022-05-08 LAB — CBC
Hematocrit: 45.7 % (ref 37.5–51.0)
Hemoglobin: 15.3 g/dL (ref 13.0–17.7)
MCH: 33.3 pg — ABNORMAL HIGH (ref 26.6–33.0)
MCHC: 33.5 g/dL (ref 31.5–35.7)
MCV: 99 fL — ABNORMAL HIGH (ref 79–97)
Platelets: 246 10*3/uL (ref 150–450)
RBC: 4.6 x10E6/uL (ref 4.14–5.80)
RDW: 14.5 % (ref 11.6–15.4)
WBC: 10.4 10*3/uL (ref 3.4–10.8)

## 2022-05-08 LAB — COMPREHENSIVE METABOLIC PANEL
ALT: 7 IU/L (ref 0–44)
AST: 14 IU/L (ref 0–40)
Albumin/Globulin Ratio: 2 (ref 1.2–2.2)
Albumin: 4.3 g/dL (ref 3.9–4.9)
Alkaline Phosphatase: 72 IU/L (ref 44–121)
BUN/Creatinine Ratio: 10 (ref 10–24)
BUN: 17 mg/dL (ref 8–27)
Bilirubin Total: 0.4 mg/dL (ref 0.0–1.2)
CO2: 27 mmol/L (ref 20–29)
Calcium: 9.2 mg/dL (ref 8.6–10.2)
Chloride: 101 mmol/L (ref 96–106)
Creatinine, Ser: 1.67 mg/dL — ABNORMAL HIGH (ref 0.76–1.27)
Globulin, Total: 2.2 g/dL (ref 1.5–4.5)
Glucose: 84 mg/dL (ref 70–99)
Potassium: 4.5 mmol/L (ref 3.5–5.2)
Sodium: 141 mmol/L (ref 134–144)
Total Protein: 6.5 g/dL (ref 6.0–8.5)
eGFR: 45 mL/min/{1.73_m2} — ABNORMAL LOW (ref >59)

## 2022-05-08 LAB — LIPID PANEL
Chol/HDL Ratio: 4.6 ratio (ref 0.0–5.0)
Cholesterol, Total: 151 mg/dL (ref 100–199)
HDL: 33 mg/dL — ABNORMAL LOW (ref >39)
LDL Chol Calc (NIH): 91 mg/dL (ref 0–99)
Triglycerides: 150 mg/dL — ABNORMAL HIGH (ref 0–149)
VLDL Cholesterol Cal: 27 mg/dL (ref 5–40)

## 2022-05-08 LAB — PSA TOTAL (REFLEX TO FREE): Prostate Specific Ag, Serum: 0.5 ng/mL (ref 0.0–4.0)

## 2022-05-08 LAB — HEMOGLOBIN A1C
Est. average glucose Bld gHb Est-mCnc: 123 mg/dL
Hgb A1c MFr Bld: 5.9 % — ABNORMAL HIGH (ref 4.8–5.6)

## 2022-05-11 IMAGING — CR DG ABDOMEN 1V
1 series · 2 of 2 positions shown · non-contrast
Comparison: None.

CLINICAL DATA: Hematuria and flank pain in a patient with a history
of urinary tract stones.

EXAM:
ABDOMEN - 1 VIEW

[Series 1: dg abd 1 view · 0.14mm/px · 2 of 2 slices shown]
[im 1/2]
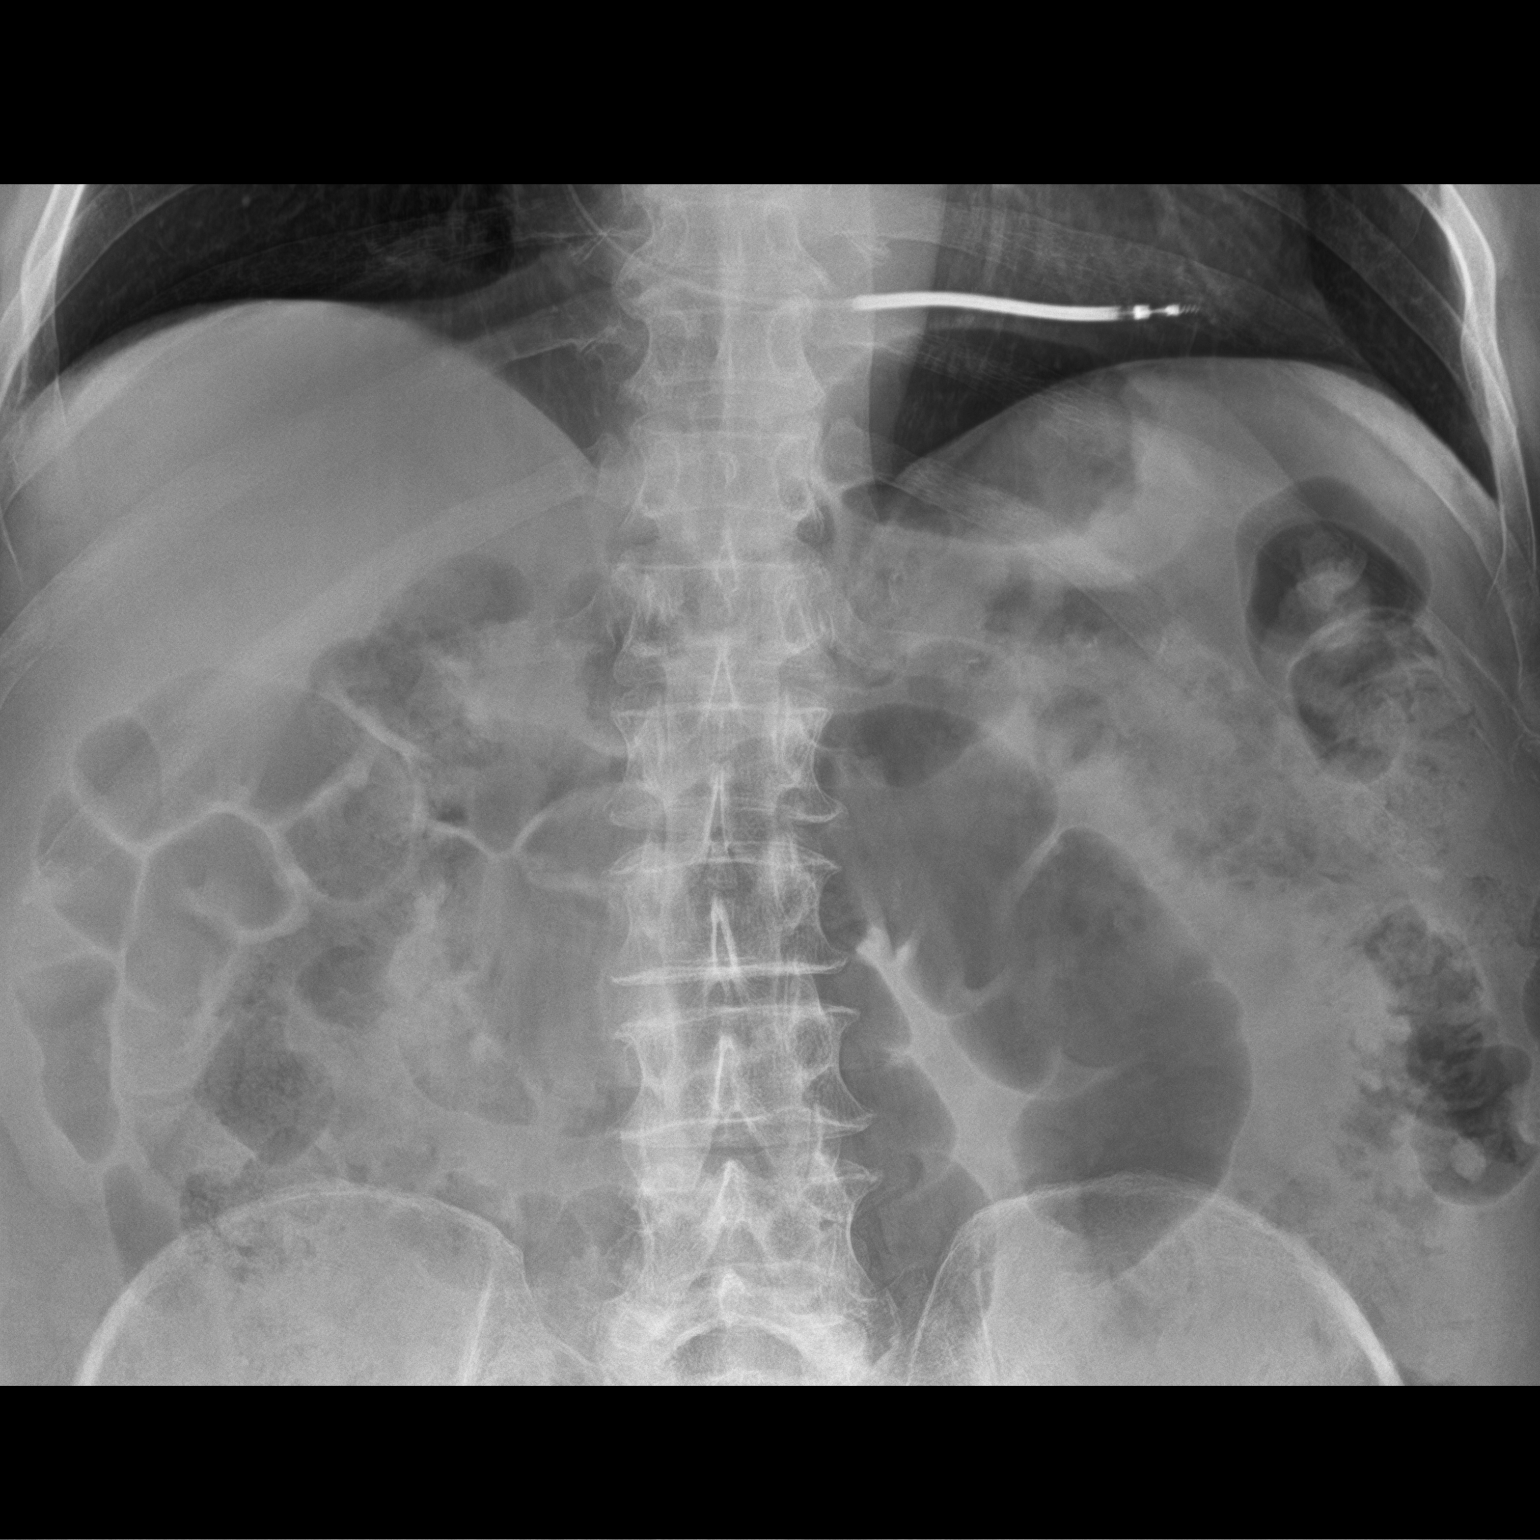
[im 2/2]
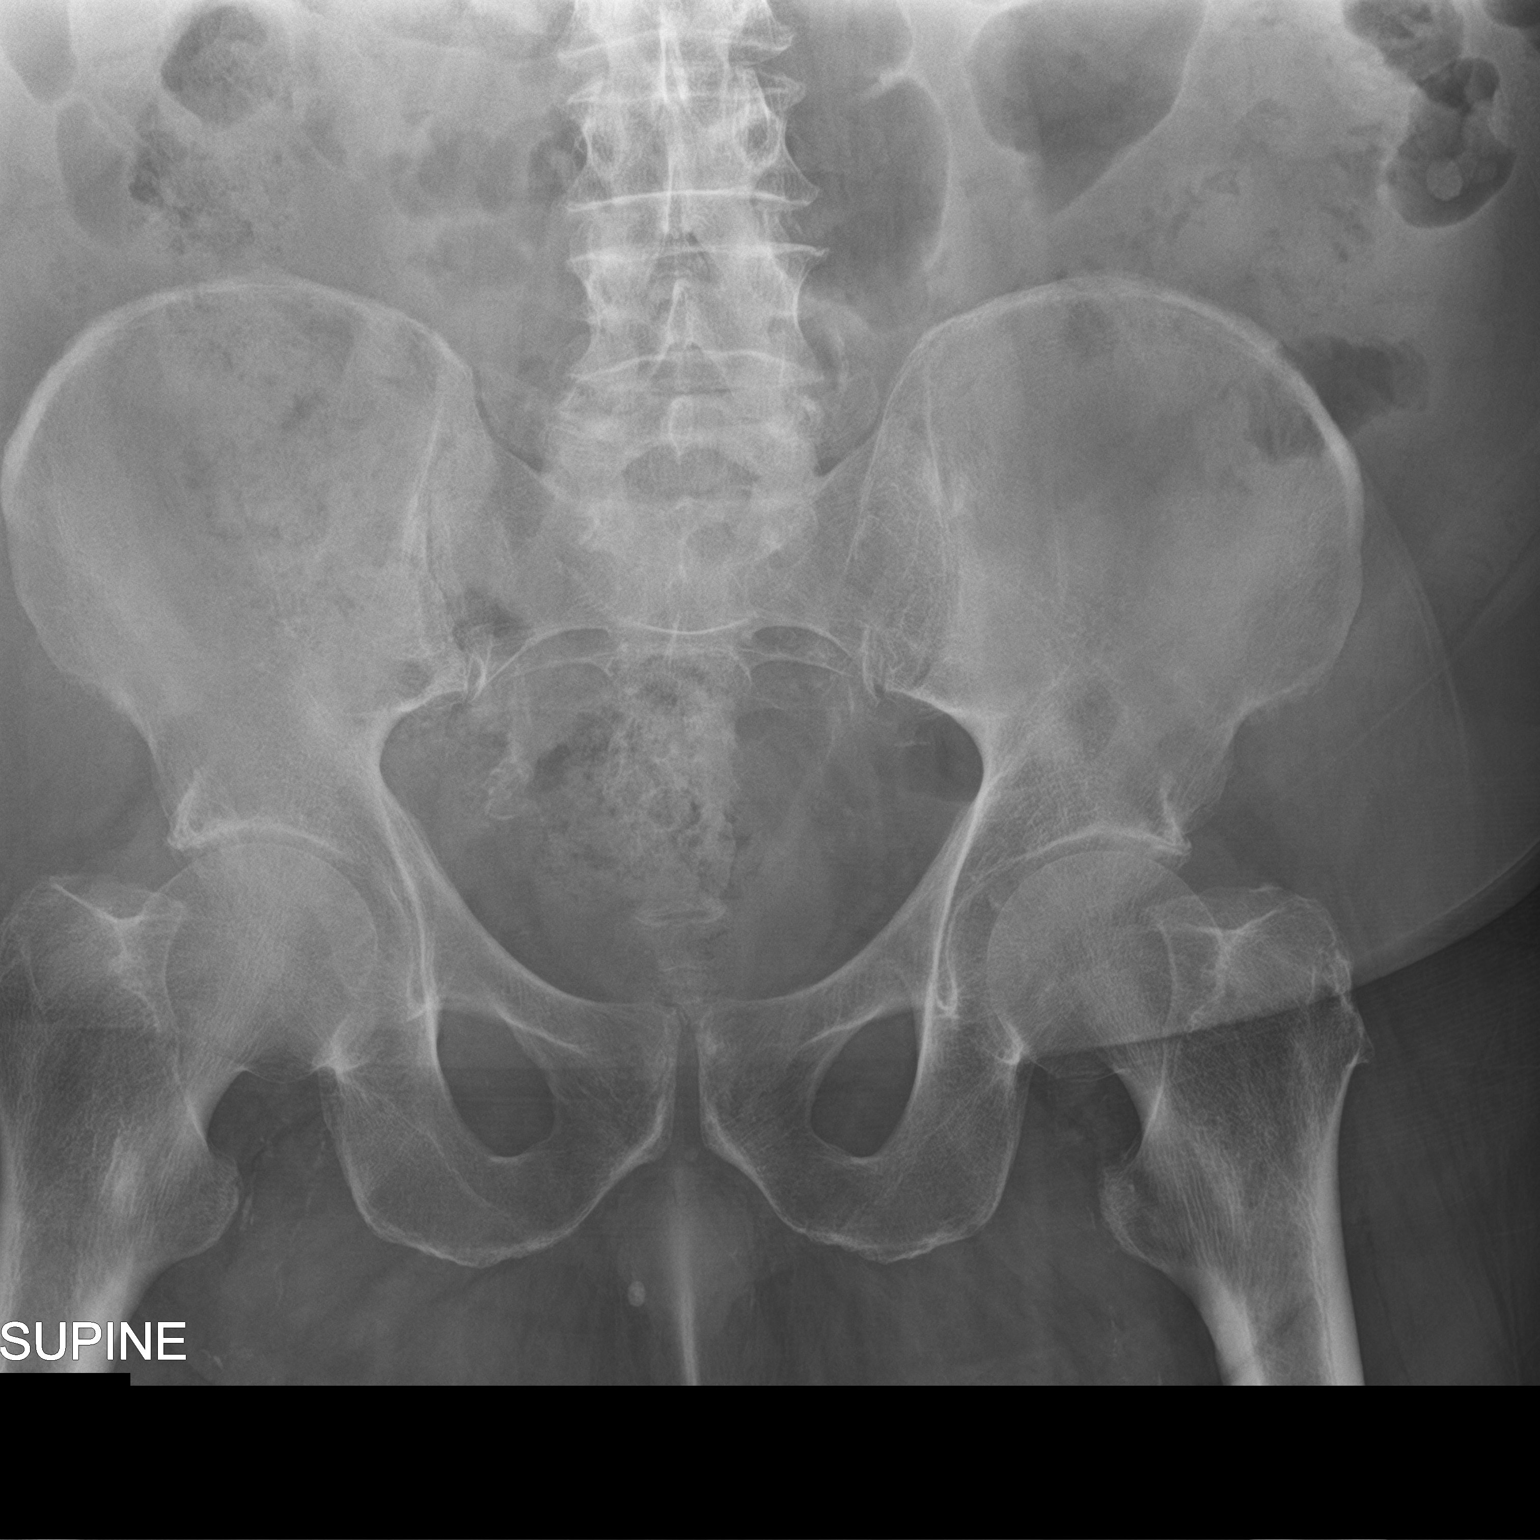

[2 of 2 positions shown; findings below may reference images not displayed]

FINDINGS: The bowel gas pattern is normal. No radio-opaque calculi or other
significant radiographic abnormality are seen. There is a large
volume of gas and stool in the colon which could obscure small
stones.
IMPRESSION: Negative for urinary tract stone or acute abnormality.

## 2022-05-15 ENCOUNTER — Other Ambulatory Visit: Payer: Self-pay | Admitting: Family Medicine

## 2022-05-15 DIAGNOSIS — J301 Allergic rhinitis due to pollen: Secondary | ICD-10-CM

## 2022-05-22 ENCOUNTER — Telehealth: Payer: Self-pay

## 2022-05-22 DIAGNOSIS — I5023 Acute on chronic systolic (congestive) heart failure: Secondary | ICD-10-CM | POA: Diagnosis not present

## 2022-05-22 DIAGNOSIS — G4733 Obstructive sleep apnea (adult) (pediatric): Secondary | ICD-10-CM | POA: Diagnosis not present

## 2022-05-22 DIAGNOSIS — J449 Chronic obstructive pulmonary disease, unspecified: Secondary | ICD-10-CM | POA: Diagnosis not present

## 2022-05-22 DIAGNOSIS — I428 Other cardiomyopathies: Secondary | ICD-10-CM | POA: Diagnosis not present

## 2022-05-22 DIAGNOSIS — Z720 Tobacco use: Secondary | ICD-10-CM | POA: Diagnosis not present

## 2022-05-22 DIAGNOSIS — I471 Supraventricular tachycardia: Secondary | ICD-10-CM | POA: Diagnosis not present

## 2022-05-22 DIAGNOSIS — I480 Paroxysmal atrial fibrillation: Secondary | ICD-10-CM | POA: Diagnosis not present

## 2022-05-22 DIAGNOSIS — R1310 Dysphagia, unspecified: Secondary | ICD-10-CM

## 2022-05-22 DIAGNOSIS — I7121 Aneurysm of the ascending aorta, without rupture: Secondary | ICD-10-CM | POA: Diagnosis not present

## 2022-05-22 DIAGNOSIS — I2510 Atherosclerotic heart disease of native coronary artery without angina pectoris: Secondary | ICD-10-CM | POA: Diagnosis not present

## 2022-05-22 DIAGNOSIS — Z95810 Presence of automatic (implantable) cardiac defibrillator: Secondary | ICD-10-CM | POA: Diagnosis not present

## 2022-05-22 DIAGNOSIS — U071 COVID-19: Secondary | ICD-10-CM | POA: Diagnosis not present

## 2022-05-22 DIAGNOSIS — R55 Syncope and collapse: Secondary | ICD-10-CM | POA: Diagnosis not present

## 2022-05-22 NOTE — Telephone Encounter (Signed)
Copied from [place] [id number]. Topic: General - Other >> May 22, 2022 11:55 AM Dylan Lee wrote: Reason for CRM: The patient's wife has called to request orders for a swallowing study for the patient   The patient has continued to experience difficulty eating   Please contact further if needed

## 2022-05-22 NOTE — Addendum Note (Signed)
Addended by: Birdie Sons on: 05/22/2022 05:40 PM   Modules accepted: Orders

## 2022-05-25 ENCOUNTER — Encounter: Payer: Self-pay | Admitting: Family Medicine

## 2022-05-30 ENCOUNTER — Other Ambulatory Visit: Payer: Self-pay | Admitting: Family Medicine

## 2022-05-30 DIAGNOSIS — R1310 Dysphagia, unspecified: Secondary | ICD-10-CM

## 2022-06-08 DIAGNOSIS — J449 Chronic obstructive pulmonary disease, unspecified: Secondary | ICD-10-CM | POA: Diagnosis not present

## 2022-06-13 ENCOUNTER — Other Ambulatory Visit: Payer: Self-pay | Admitting: Family Medicine

## 2022-06-13 DIAGNOSIS — J301 Allergic rhinitis due to pollen: Secondary | ICD-10-CM

## 2022-06-13 NOTE — Telephone Encounter (Signed)
OV in 2 months . Requested Prescriptions  Pending Prescriptions Disp Refills  . montelukast (SINGULAIR) 10 MG tablet [Pharmacy Med Name: Montelukast Sodium 10 MG Oral Tablet] 30 tablet 0    Sig: TAKE 1 TABLET BY MOUTH AT BEDTIME     Pulmonology:  Leukotriene Inhibitors Passed - 06/13/2022 10:38 AM      Passed - Valid encounter within last 12 months    Recent Outpatient Visits          1 month ago Annual physical exam   [facility] Birdie Sons, MD   7 months ago Acute cough   [facility] Birdie Sons, MD   8 months ago [place], Donald E, MD   8 months ago Pneumonia due to COVID-19 virus   [place], Kirstie Peri, MD   8 months ago [place], Kirstie Peri, MD      Future Appointments            In 2 months Fisher, Kirstie Peri, MD [facility], [place]

## 2022-06-15 ENCOUNTER — Ambulatory Visit: Admission: RE | Admit: 2022-06-15 | Payer: Medicare HMO | Source: Ambulatory Visit

## 2022-07-08 DIAGNOSIS — J449 Chronic obstructive pulmonary disease, unspecified: Secondary | ICD-10-CM | POA: Diagnosis not present

## 2022-07-16 ENCOUNTER — Other Ambulatory Visit: Payer: Self-pay | Admitting: Family Medicine

## 2022-07-16 DIAGNOSIS — J301 Allergic rhinitis due to pollen: Secondary | ICD-10-CM

## 2022-07-31 DIAGNOSIS — R0609 Other forms of dyspnea: Secondary | ICD-10-CM | POA: Diagnosis not present

## 2022-07-31 DIAGNOSIS — R1310 Dysphagia, unspecified: Secondary | ICD-10-CM | POA: Diagnosis not present

## 2022-07-31 DIAGNOSIS — Z23 Encounter for immunization: Secondary | ICD-10-CM | POA: Diagnosis not present

## 2022-07-31 DIAGNOSIS — J449 Chronic obstructive pulmonary disease, unspecified: Secondary | ICD-10-CM | POA: Diagnosis not present

## 2022-07-31 DIAGNOSIS — G4733 Obstructive sleep apnea (adult) (pediatric): Secondary | ICD-10-CM | POA: Diagnosis not present

## 2022-07-31 DIAGNOSIS — Z9981 Dependence on supplemental oxygen: Secondary | ICD-10-CM | POA: Diagnosis not present

## 2022-08-08 DIAGNOSIS — J449 Chronic obstructive pulmonary disease, unspecified: Secondary | ICD-10-CM | POA: Diagnosis not present

## 2022-08-15 ENCOUNTER — Ambulatory Visit (INDEPENDENT_AMBULATORY_CARE_PROVIDER_SITE_OTHER): Payer: Medicare HMO | Admitting: Family Medicine

## 2022-08-15 ENCOUNTER — Encounter: Payer: Self-pay | Admitting: Family Medicine

## 2022-08-15 VITALS — BP 135/82 | HR 77 | Temp 98.2°F | Resp 20 | Wt 227.0 lb

## 2022-08-15 DIAGNOSIS — I10 Essential (primary) hypertension: Secondary | ICD-10-CM

## 2022-08-15 DIAGNOSIS — N1831 Chronic kidney disease, stage 3a: Secondary | ICD-10-CM | POA: Diagnosis not present

## 2022-08-15 DIAGNOSIS — J449 Chronic obstructive pulmonary disease, unspecified: Secondary | ICD-10-CM | POA: Diagnosis not present

## 2022-08-15 NOTE — Progress Notes (Signed)
I,Roshena L Chambers,acting as a scribe for Lelon Huh, MD.,have documented all relevant documentation on the behalf of Lelon Huh, MD,as directed by  Lelon Huh, MD while in the presence of Lelon Huh, MD.   Established patient visit   Patient: Dylan Lee   DOB: Jul 26, 1956   66 y.o. Male  MRN: [id number] Visit Date: 08/15/2022  Today's healthcare provider: Lelon Huh, MD   Chief Complaint  Patient presents with   Hypertension   Subjective    HPI  Hypertension, follow-up  BP Readings from Last 3 Encounters:  08/15/22 135/82  05/07/22 (!) 153/83  11/13/21 (!) 141/92   Wt Readings from Last 3 Encounters:  08/15/22 227 lb (103 kg)  05/07/22 224 lb (101.6 kg)  11/13/21 224 lb (101.6 kg)     He was last seen for hypertension 3 months ago.  Management since that visit includes continuing same medications.  Labs checked showing-kidney function decline. Advised to drink more water.  He reports good compliance with treatment. He is not having side effects.  He is following a Regular diet. He is not exercising. He does smoke.  Use of agents associated with hypertension: NSAIDS.   Outside blood pressures are checked occasionally. Symptoms: No chest pain No chest pressure  No palpitations No syncope  Yes dyspnea No orthopnea  No paroxysmal nocturnal dyspnea No lower extremity edema   Pertinent labs Lab Results  Component Value Date   CHOL 151 05/07/2022   HDL 33 (L) 05/07/2022   LDLCALC 91 05/07/2022   TRIG 150 (H) 05/07/2022   CHOLHDL 4.6 05/07/2022   Lab Results  Component Value Date   NA 141 05/07/2022   K 4.5 05/07/2022   CREATININE 1.67 (H) 05/07/2022   EGFR 45 (L) 05/07/2022   GLUCOSE 84 05/07/2022   TSH 2.580 01/14/2019     The 10-year ASCVD risk score (Arnett DK, et al., 2019) is: 25%  ---------------------------------------------------------------------------------------------------   Medications: Outpatient Medications  Prior to Visit  Medication Sig   aspirin 81 MG tablet Take 81 mg by mouth daily.   ipratropium (ATROVENT HFA) 17 MCG/ACT inhaler Inhale 2 puffs into the lungs every 8 (eight) hours.   methotrexate 50 MG/2ML injection INJECT 0.6 ML INTO THE SKIN ONCE A WEEK   metoprolol succinate (TOPROL-XL) 25 MG 24 hr tablet Take 1 tablet (25 mg total) by mouth daily.   montelukast (SINGULAIR) 10 MG tablet TAKE 1 TABLET BY MOUTH AT BEDTIME   rosuvastatin (CRESTOR) 40 MG tablet Take 1 tablet by mouth once daily   sacubitril-valsartan (ENTRESTO) 97-103 MG Take 1 tablet by mouth 2 (two) times daily.   SYMBICORT 160-4.5 MCG/ACT inhaler INHALE 2 PUFFS INTO LUNGS TWICE DAILY   tamsulosin (FLOMAX) 0.4 MG CAPS capsule Take 1 capsule (0.4 mg total) by mouth daily.   torsemide (DEMADEX) 20 MG tablet Take 10 mg by mouth daily.   venlafaxine XR (EFFEXOR-XR) 150 MG 24 hr capsule Take 1 capsule (150 mg total) by mouth daily. Take in addition to one 59m capsule daily   No facility-administered medications prior to visit.    Review of Systems  Constitutional:  Negative for appetite change, chills and fever.  Respiratory:  Negative for chest tightness, shortness of breath and wheezing.   Cardiovascular:  Negative for chest pain and palpitations.  Gastrointestinal:  Negative for abdominal pain, nausea and vomiting.       Objective    BP 135/82 (BP Location: Right Arm, Patient Position: Sitting, Cuff Size:  Large)   Pulse 77   Temp 98.2 F (36.8 C) (Oral)   Resp 20   Wt 227 lb (103 kg)   SpO2 97% Comment: room air  BMI 31.67 kg/m    Physical Exam    General: Appearance:    Mildly obese male in no acute distress  Eyes:    PERRL, conjunctiva/corneas clear, EOM's intact       Lungs:     Occasional expiratory wheeze, respirations unlabored  Heart:    Normal heart rate. Normal rhythm. No murmurs, rubs, or gallops.    MS:   All extremities are intact.    Neurologic:   Awake, alert, oriented x 3. No apparent  focal neurological defect.         Assessment & Plan     1. Stage 3a chronic kidney disease ([place]) Is doing better consuming more water. Advised to avoid NSAIDs - Renal function panel  2. Primary hypertension Well controlled.  Continue current medications.    3. Chronic obstructive pulmonary disease, unspecified COPD type ([place]) Having a bit more shortness of breath and wheezing with weather changes. Continues on Symbicort and has albuterol to use prn.       The entirety of the information documented in the History of Present Illness, Review of Systems and Physical Exam were personally obtained by me. Portions of this information were initially documented by the CMA and reviewed by me for thoroughness and accuracy.     Lelon Huh, MD  [facility] [phone] (phone) [phone] (fax)  [place]

## 2022-08-15 NOTE — Patient Instructions (Addendum)
Please review the attached list of medications and notify my office if there are any errors.   I recommend that you get the RSV vaccine from your pharmacy

## 2022-08-16 ENCOUNTER — Other Ambulatory Visit: Payer: Self-pay | Admitting: Family Medicine

## 2022-08-16 DIAGNOSIS — N1832 Chronic kidney disease, stage 3b: Secondary | ICD-10-CM

## 2022-08-16 LAB — RENAL FUNCTION PANEL
Albumin: 4.4 g/dL (ref 3.9–4.9)
BUN/Creatinine Ratio: 10 (ref 10–24)
BUN: 19 mg/dL (ref 8–27)
CO2: 27 mmol/L (ref 20–29)
Calcium: 9.4 mg/dL (ref 8.6–10.2)
Chloride: 101 mmol/L (ref 96–106)
Creatinine, Ser: 1.93 mg/dL — ABNORMAL HIGH (ref 0.76–1.27)
Glucose: 122 mg/dL — ABNORMAL HIGH (ref 70–99)
Phosphorus: 4.4 mg/dL — ABNORMAL HIGH (ref 2.8–4.1)
Potassium: 4.6 mmol/L (ref 3.5–5.2)
Sodium: 145 mmol/L — ABNORMAL HIGH (ref 134–144)
eGFR: 37 mL/min/{1.73_m2} — ABNORMAL LOW (ref >59)

## 2022-08-21 DIAGNOSIS — I2510 Atherosclerotic heart disease of native coronary artery without angina pectoris: Secondary | ICD-10-CM | POA: Diagnosis not present

## 2022-08-21 DIAGNOSIS — J449 Chronic obstructive pulmonary disease, unspecified: Secondary | ICD-10-CM | POA: Diagnosis not present

## 2022-08-21 DIAGNOSIS — I7121 Aneurysm of the ascending aorta, without rupture: Secondary | ICD-10-CM | POA: Diagnosis not present

## 2022-08-21 DIAGNOSIS — G4733 Obstructive sleep apnea (adult) (pediatric): Secondary | ICD-10-CM | POA: Diagnosis not present

## 2022-08-21 DIAGNOSIS — I428 Other cardiomyopathies: Secondary | ICD-10-CM | POA: Diagnosis not present

## 2022-08-21 DIAGNOSIS — R55 Syncope and collapse: Secondary | ICD-10-CM | POA: Diagnosis not present

## 2022-08-21 DIAGNOSIS — I471 Supraventricular tachycardia, unspecified: Secondary | ICD-10-CM | POA: Diagnosis not present

## 2022-08-21 DIAGNOSIS — I480 Paroxysmal atrial fibrillation: Secondary | ICD-10-CM | POA: Diagnosis not present

## 2022-08-21 DIAGNOSIS — Z720 Tobacco use: Secondary | ICD-10-CM | POA: Diagnosis not present

## 2022-08-21 DIAGNOSIS — U071 COVID-19: Secondary | ICD-10-CM | POA: Diagnosis not present

## 2022-08-21 DIAGNOSIS — I5023 Acute on chronic systolic (congestive) heart failure: Secondary | ICD-10-CM | POA: Diagnosis not present

## 2022-08-21 DIAGNOSIS — Z95810 Presence of automatic (implantable) cardiac defibrillator: Secondary | ICD-10-CM | POA: Diagnosis not present

## 2022-08-31 ENCOUNTER — Telehealth (HOSPITAL_COMMUNITY): Payer: Self-pay

## 2022-08-31 NOTE — Telephone Encounter (Signed)
Outside/paper referral received by Dr. Clayborn Bigness from [facility]. Will fax over Physician order and request further documents. Insurance benefits and eligibility to be determined.

## 2022-09-07 DIAGNOSIS — J449 Chronic obstructive pulmonary disease, unspecified: Secondary | ICD-10-CM | POA: Diagnosis not present

## 2022-09-12 DIAGNOSIS — I428 Other cardiomyopathies: Secondary | ICD-10-CM | POA: Diagnosis not present

## 2022-09-18 ENCOUNTER — Telehealth: Payer: Self-pay | Admitting: *Deleted

## 2022-09-18 NOTE — Patient Outreach (Signed)
  Care Coordination   Initial Visit Note   09/18/2022 Name: Dylan Lee MRN: [id number] DOB: Nov 23, 1955  Dylan Lee is a 67 y.o. year old male who sees Dylan Lee for primary care. I spoke with  Dylan Lee by phone today.  What matters to the patients health and wellness today?  Patient does not have time to talk today, denies any needs currently.  Agrees to receive [facility] brochure and will call with questions.    SDOH assessments and interventions completed:  No     Care Coordination Interventions:  No, not indicated   Follow up plan: No further intervention required.   Encounter Outcome:  Pt. Visit Completed   Dylan David, RN, MSN, Dylan Lee Care Management Care Management Coordinator [phone]

## 2022-10-08 DIAGNOSIS — J449 Chronic obstructive pulmonary disease, unspecified: Secondary | ICD-10-CM | POA: Diagnosis not present

## 2022-10-10 ENCOUNTER — Ambulatory Visit (INDEPENDENT_AMBULATORY_CARE_PROVIDER_SITE_OTHER): Payer: Medicare HMO

## 2022-10-10 VITALS — Ht 71.0 in | Wt 227.0 lb

## 2022-10-10 DIAGNOSIS — Z0000 Encounter for general adult medical examination without abnormal findings: Secondary | ICD-10-CM

## 2022-10-10 DIAGNOSIS — Z122 Encounter for screening for malignant neoplasm of respiratory organs: Secondary | ICD-10-CM

## 2022-10-10 NOTE — Patient Instructions (Signed)
[name] , Thank you for taking time to come for your Medicare Wellness Visit. I appreciate your ongoing commitment to your health goals. Please review the following plan we discussed and let me know if I can assist you in the future.   These are the goals we discussed:  Goals      DIET - EAT MORE FRUITS AND VEGETABLES     Exercise 3x per week (30 min per time)     Recommend to exercise for 3 days a week for at least 30 minutes at a time.         This is a list of the screening recommended for you and due dates:  Health Maintenance  Topic Date Due   COVID-19 Vaccine (1) Never done   DTaP/Tdap/Td vaccine (1 - Tdap) Never done   Zoster (Shingles) Vaccine (1 of 2) Never done   Colon Cancer Screening  02/15/2015   Medicare Annual Wellness Visit  10/11/2023   Pneumonia Vaccine  Completed   Flu Shot  Completed   Hepatitis C Screening: USPSTF Recommendation to screen - Ages 18-79 yo.  Completed   HPV Vaccine  Aged Out    Advanced directives: yes  Conditions/risks identified: falls risk  Next appointment: Follow up in one year for your annual wellness visit. 10/15/2023 '@1pm'$  telephone  Preventive Care 68 Years and Older, Male  Preventive care refers to lifestyle choices and visits with your health care provider that can promote health and wellness. What does preventive care include? A yearly physical exam. This is also called an annual well check. Dental exams once or twice a year. Routine eye exams. Ask your health care provider how often you should have your eyes checked. Personal lifestyle choices, including: Daily care of your teeth and gums. Regular physical activity. Eating a healthy diet. Avoiding tobacco and drug use. Limiting alcohol use. Practicing safe sex. Taking low doses of aspirin every day. Taking vitamin and mineral supplements as recommended by your health care provider. What happens during an annual well check? The services and screenings done by your  health care provider during your annual well check will depend on your age, overall health, lifestyle risk factors, and family history of disease. Counseling  Your health care provider may ask you questions about your: Alcohol use. Tobacco use. Drug use. Emotional well-being. Home and relationship well-being. Sexual activity. Eating habits. History of falls. Memory and ability to understand (cognition). Work and work Statistician. Screening  You may have the following tests or measurements: Height, weight, and BMI. Blood pressure. Lipid and cholesterol levels. These may be checked every 5 years, or more frequently if you are over 102 years old. Skin check. Lung cancer screening. You may have this screening every year starting at age 68 if you have a 30-pack-year history of smoking and currently smoke or have quit within the past 15 years. Fecal occult blood test (FOBT) of the stool. You may have this test every year starting at age 68. Flexible sigmoidoscopy or colonoscopy. You may have a sigmoidoscopy every 5 years or a colonoscopy every 10 years starting at age 68. Prostate cancer screening. Recommendations will vary depending on your family history and other risks. Hepatitis C blood test. Hepatitis B blood test. Sexually transmitted disease (STD) testing. Diabetes screening. This is done by checking your blood sugar (glucose) after you have not eaten for a while (fasting). You may have this done every 1-3 years. Abdominal aortic aneurysm (AAA) screening. You may need this if  you are a current or former smoker. Osteoporosis. You may be screened starting at age 88 if you are at high risk. Talk with your health care provider about your test results, treatment options, and if necessary, the need for more tests. Vaccines  Your health care provider may recommend certain vaccines, such as: Influenza vaccine. This is recommended every year. Tetanus, diphtheria, and acellular pertussis  (Tdap, Td) vaccine. You may need a Td booster every 10 years. Zoster vaccine. You may need this after age 68. Pneumococcal 13-valent conjugate (PCV13) vaccine. One dose is recommended after age 68. Pneumococcal polysaccharide (PPSV23) vaccine. One dose is recommended after age 68. Talk to your health care provider about which screenings and vaccines you need and how often you need them. This information is not intended to replace advice given to you by your health care provider. Make sure you discuss any questions you have with your health care provider. Document Released: 09/30/2015 Document Revised: 05/23/2016 Document Reviewed: 07/05/2015 Elsevier Interactive Patient Education  2017 [place] Prevention in the Home Falls can cause injuries. They can happen to people of all ages. There are many things you can do to make your home safe and to help prevent falls. What can I do on the outside of my home? Regularly fix the edges of walkways and driveways and fix any cracks. Remove anything that might make you trip as you walk through a door, such as a raised step or threshold. Trim any bushes or trees on the path to your home. Use bright outdoor lighting. Clear any walking paths of anything that might make someone trip, such as rocks or tools. Regularly check to see if handrails are loose or broken. Make sure that both sides of any steps have handrails. Any raised decks and porches should have guardrails on the edges. Have any leaves, snow, or ice cleared regularly. Use sand or salt on walking paths during winter. Clean up any spills in your garage right away. This includes oil or grease spills. What can I do in the bathroom? Use night lights. Install grab bars by the toilet and in the tub and shower. Do not use towel bars as grab bars. Use non-skid mats or decals in the tub or shower. If you need to sit down in the shower, use a plastic, non-slip stool. Keep the floor dry. Clean  up any water that spills on the floor as soon as it happens. Remove soap buildup in the tub or shower regularly. Attach bath mats securely with double-sided non-slip rug tape. Do not have throw rugs and other things on the floor that can make you trip. What can I do in the bedroom? Use night lights. Make sure that you have a light by your bed that is easy to reach. Do not use any sheets or blankets that are too big for your bed. They should not hang down onto the floor. Have a firm chair that has side arms. You can use this for support while you get dressed. Do not have throw rugs and other things on the floor that can make you trip. What can I do in the kitchen? Clean up any spills right away. Avoid walking on wet floors. Keep items that you use a lot in easy-to-reach places. If you need to reach something above you, use a strong step stool that has a grab bar. Keep electrical cords out of the way. Do not use floor polish or wax that makes floors slippery. If  you must use wax, use non-skid floor wax. Do not have throw rugs and other things on the floor that can make you trip. What can I do with my stairs? Do not leave any items on the stairs. Make sure that there are handrails on both sides of the stairs and use them. Fix handrails that are broken or loose. Make sure that handrails are as long as the stairways. Check any carpeting to make sure that it is firmly attached to the stairs. Fix any carpet that is loose or worn. Avoid having throw rugs at the top or bottom of the stairs. If you do have throw rugs, attach them to the floor with carpet tape. Make sure that you have a light switch at the top of the stairs and the bottom of the stairs. If you do not have them, ask someone to add them for you. What else can I do to help prevent falls? Wear shoes that: Do not have high heels. Have rubber bottoms. Are comfortable and fit you well. Are closed at the toe. Do not wear sandals. If you  use a stepladder: Make sure that it is fully opened. Do not climb a closed stepladder. Make sure that both sides of the stepladder are locked into place. Ask someone to hold it for you, if possible. Clearly mark and make sure that you can see: Any grab bars or handrails. First and last steps. Where the edge of each step is. Use tools that help you move around (mobility aids) if they are needed. These include: Canes. Walkers. Scooters. Crutches. Turn on the lights when you go into a dark area. Replace any light bulbs as soon as they burn out. Set up your furniture so you have a clear path. Avoid moving your furniture around. If any of your floors are uneven, fix them. If there are any pets around you, be aware of where they are. Review your medicines with your doctor. Some medicines can make you feel dizzy. This can increase your chance of falling. Ask your doctor what other things that you can do to help prevent falls. This information is not intended to replace advice given to you by your health care provider. Make sure you discuss any questions you have with your health care provider. Document Released: 06/30/2009 Document Revised: 02/09/2016 Document Reviewed: 10/08/2014 Elsevier Interactive Patient Education  2017 [employer].

## 2022-10-10 NOTE — Progress Notes (Signed)
Virtual Visit via Telephone Note  I connected with  Dylan Lee on 10/10/22 at  1:00 PM EST by telephone and verified that I am speaking with the correct person using two identifiers.  Location: Patient: home Provider: [facility] Persons participating in the virtual visit: patient/Nurse Health Advisor   I discussed the limitations, risks, security and privacy concerns of performing an evaluation and management service by telephone and the availability of in person appointments. The patient expressed understanding and agreed to proceed.  Interactive audio and video telecommunications were attempted between this nurse and patient, however failed, due to patient having technical difficulties OR patient did not have access to video capability.  We continued and completed visit with audio only.  Some vital signs may be absent or patient reported.   Roger Shelter, LPN  Subjective:   Dylan Lee is a 67 y.o. male who presents for Medicare Annual/Subsequent preventive examination.  Review of Systems     Cardiac Risk Factors include: advanced age (>3mn, >>84women);dyslipidemia;hypertension;male gender;sedentary lifestyle;obesity (BMI >30kg/m2)     Objective:    Today's Vitals   10/10/22 1256  Weight: 227 lb (103 kg)  Height: '5\' 11"'$  (1.803 m)   Body mass index is 31.66 kg/m.     10/10/2022    1:29 PM 10/09/2021    1:55 PM 09/24/2021    9:26 PM 09/08/2021    6:26 PM 09/07/2021    4:57 PM 10/03/2020    2:20 PM 06/03/2015   11:52 AM  Advanced Directives  Does Patient Have a Medical Advance Directive? Yes No No  No No No  Type of AParamedicof ADecaturLiving will        Does patient want to make changes to medical advance directive? Yes (ED - Information included in AVS)        Copy of HScipioin Chart? No - copy requested        Would patient like information on creating a medical advance directive?  No - Patient declined No -  Patient declined No - Patient declined  No - Patient declined No - patient declined information    Current Medications (verified) Outpatient Encounter Medications as of 10/10/2022  Medication Sig   amiodarone (PACERONE) 200 MG tablet Take 200 mg by mouth daily.   aspirin 81 MG tablet Take 81 mg by mouth daily.   ipratropium (ATROVENT HFA) 17 MCG/ACT inhaler Inhale 2 puffs into the lungs every 8 (eight) hours.   metoprolol succinate (TOPROL-XL) 25 MG 24 hr tablet Take 1 tablet (25 mg total) by mouth daily.   montelukast (SINGULAIR) 10 MG tablet TAKE 1 TABLET BY MOUTH AT BEDTIME   omeprazole (PRILOSEC) 20 MG capsule Take 20 mg by mouth daily.   rosuvastatin (CRESTOR) 40 MG tablet Take 1 tablet by mouth once daily   sacubitril-valsartan (ENTRESTO) 97-103 MG Take 1 tablet by mouth 2 (two) times daily.   spironolactone (ALDACTONE) 25 MG tablet Take 25 mg by mouth daily. Half tablet   SYMBICORT 160-4.5 MCG/ACT inhaler INHALE 2 PUFFS INTO LUNGS TWICE DAILY   torsemide (DEMADEX) 20 MG tablet Take 10 mg by mouth daily.   venlafaxine XR (EFFEXOR-XR) 150 MG 24 hr capsule Take 1 capsule (150 mg total) by mouth daily. Take in addition to one '75mg'$  capsule daily   methotrexate 50 MG/2ML injection INJECT 0.6 ML INTO THE SKIN ONCE A WEEK (Patient not taking: Reported on 10/10/2022)   tamsulosin (FLOMAX) 0.4 MG CAPS  capsule Take 1 capsule (0.4 mg total) by mouth daily. (Patient not taking: Reported on 10/10/2022)   No facility-administered encounter medications on file as of 10/10/2022.    Allergies (verified) Patient has no known allergies.   History: Past Medical History:  Diagnosis Date   CHF (congestive heart failure) (HCC)    Encephalopathy due to COVID-19 virus 09/07/2021   Hepatitis C    Hep C treated    History of chicken pox    History of measles    History of mumps    Hyperlipidemia    Hypertension    Kidney stones    Pneumonia due to COVID-19 virus 09/07/2021   Shingles 09/06/2015    Past Surgical History:  Procedure Laterality Date   BUNIONECTOMY     CARDIAC CATHETERIZATION N/A 03/02/2015   Procedure: Right and Left Heart Cath;  Surgeon: Yolonda Kida, MD;  Location: [facility];  Service: Cardiovascular;  Laterality: N/A;   DENTAL SURGERY     FOOT SURGERY Right    IMPLANTABLE CARDIOVERTER DEFIBRILLATOR (ICD) GENERATOR CHANGE Right 06/03/2015   Procedure: ICD generator insertion ;  Surgeon: Marzetta Board, MD;  Location: [facility];  Service: Cardiovascular;  Laterality: Right;   LITHOTRIPSY     sleep study  07/2010   severe OSA, snoring. Bilevel pressure at 19/14. Jiengel. ENT consult/ Jiengel: no ENT surgical indication for OSA   SPIROMETRY  10/20/2014   Severe obstruction   Family History  Problem Relation Age of Onset   Hypertension Mother    Bipolar disorder Mother    Ovarian cancer Mother    Heart disease Mother    Diabetes Father    Alcohol abuse Father    CAD Father    Depression Father    Alcohol abuse Sister    Congestive Heart Failure Brother    Alcohol abuse Brother    Liver cancer Brother    Social History   Socioeconomic History   Marital status: Married    Spouse name: Not on file   Number of children: 0   Years of education: Not on file   Highest education level: High school graduate  Occupational History   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 30+ years ago  Vaping Use   Vaping Use: Never used  Substance and Sexual Activity   Alcohol use: No    Alcohol/week: 0.0 standard drinks of alcohol   Drug use: Yes   Sexual activity: Not on file  Other Topics Concern   Not on file  Social History Narrative   Not on file   Social Determinants of Health   Financial Resource Strain: Low Risk   (10/10/2022)   Overall Financial Resource Strain (CARDIA)    Difficulty of Paying Living Expenses: Not hard at all  Food Insecurity: No Food Insecurity (10/10/2022)   Hunger Vital Sign    Worried About Running Out of Food in the Last Year: Never true    Ran Out of Food in the Last Year: Never true  Transportation Needs: No Transportation Needs (10/10/2022)   PRAPARE - Hydrologist (Medical): No    Lack of Transportation (Non-Medical): No  Physical Activity: Inactive (10/10/2022)  Exercise Vital Sign    Days of Exercise per Week: 0 days    Minutes of Exercise per Session: 0 min  Stress: No Stress Concern Present (10/10/2022)   [place]    Feeling of Stress : Only a little  Social Connections: Socially Isolated (10/10/2022)   Social Connection and Isolation Panel [NHANES]    Frequency of Communication with Friends and Family: Twice a week    Frequency of Social Gatherings with Friends and Family: Never    Attends Religious Services: Never    Marine scientist or Organizations: No    Attends Music therapist: Never    Marital Status: Married    Tobacco Counseling Ready to quit: Not Answered Counseling given: Not Answered Tobacco comments: smokes 1-2 cigars daily; quit cigs. 30+ years ago   Clinical Intake:  Pre-visit preparation completed: Yes  Pain : No/denies pain     BMI - recorded: 31.66 Nutritional Risks: None  How often do you need to have someone help you when you read instructions, pamphlets, or other written materials from your doctor or pharmacy?: 1 - Never  Diabetic?no  Interpreter Needed?: No  Information entered by :: B.Athalia Setterlund,LPN   Activities of Daily Living    10/10/2022    1:32 PM  In your present state of health, do you have any difficulty performing the following activities:  Hearing? 1  Comment needs appt to assess need for hearing  YTK:PTWS make appt  Vision? 0  Difficulty concentrating or making decisions? 0  Walking or climbing stairs? 1  Comment gives out when walking too far  Dressing or bathing? 0  Doing errands, shopping? 1  Comment wife assists  Preparing Food and eating ? N  Using the Toilet? N  In the past six months, have you accidently leaked urine? N  Do you have problems with loss of bowel control? N  Managing your Medications? Y  Comment wife assists  Managing your Finances? N  Housekeeping or managing your Housekeeping? Y  Comment wife helps/does    Patient Care Team: Birdie Sons, MD as PCP - General (Family Medicine) Manya Silvas, MD (Inactive) (Gastroenterology) Yolonda Kida, MD as Consulting Physician (Cardiology) Marlowe Sax, MD as Referring Physician (Internal Medicine) Ocie Doyne, [place] (Optometry)  Indicate any recent Medical Services you may have received from other than [facility] providers in the past year (date may be approximate).     Assessment:   This is a routine wellness examination for Dylan Lee.  Hearing/Vision screen Hearing Screening - Comments:: Small amount of hearing loss Vision Screening - Comments:: Glasses:vision not adequate for reading fine print. Dr Royetta Crochet  Dietary issues and exercise activities discussed: Current Exercise Habits: The patient does not participate in regular exercise at present, Exercise limited by: cardiac condition(s);Other - see comments (along with other conditions)   Goals Addressed             This Visit's Progress    DIET - EAT MORE FRUITS AND VEGETABLES   On track    Exercise 3x per week (30 min per time)   Not on track    Recommend to exercise for 3 days a week for at least 30 minutes at a time.        Depression Screen    10/10/2022    1:11 PM 10/09/2021    1:52 PM 10/06/2021    1:56 PM 04/03/2021    1:22 PM 02/17/2021  11:25 AM 12/13/2020   10:30 AM 10/03/2020    2:15 PM  PHQ 2/9 Scores  PHQ -  2 Score 0 0 '3 2 2 4 '$ 0  PHQ- 9 Score  '8 11 6 6 13     '$ Fall Risk    10/10/2022    1:03 PM 10/09/2021    1:56 PM 04/03/2021    1:21 PM 02/17/2021   11:24 AM 12/13/2020   10:31 AM  Fall Risk   Falls in the past year? 1 1 0 0 0  Number falls in past yr: 1 1 0 0 0  Injury with Fall? 0 1 0 0 0  Risk for fall due to : Impaired mobility;Impaired balance/gait;Orthopedic patient History of fall(s) No Fall Risks No Fall Risks   Follow up Education provided;Falls prevention discussed Falls prevention discussed Falls evaluation completed Falls evaluation completed Falls evaluation completed    FALL RISK PREVENTION PERTAINING TO THE HOME:  Any stairs in or around the home? Yes  If so, are there any without handrails? Yes  Home free of loose throw rugs in walkways, pet beds, electrical cords, etc? Yes  Adequate lighting in your home to reduce risk of falls? Yes   ASSISTIVE DEVICES UTILIZED TO PREVENT FALLS:  Life alert? Yes  Use of a cane, walker or w/c? Yes cane Grab bars in the bathroom? Yes  Shower chair or bench in shower? Yes  Elevated toilet seat or a handicapped toilet? Yes     10/10/2022    1:23 PM  6CIT Screen  What Year? 0 points  What month? 0 points  What time? 0 points  Count back from 20 0 points  Months in reverse 0 points  Repeat phrase 2 points  Total Score 2 points    Immunizations Immunization History  Administered Date(s) Administered   Fluad Quad(high Dose 65+) 09/13/2021   Influenza Inj Mdck Quad Pf 07/31/2022   Influenza Split 06/24/2009, 06/22/2010   Influenza,inj,Quad PF,6+ Mos 05/28/2018, 06/12/2019   Influenza-Unspecified 07/19/2017   Pneumococcal Conjugate-13 09/12/2020   Pneumococcal Polysaccharide-23 06/24/2009, 05/07/2022   Rsv, Bivalent, Protein Subunit Rsvpref,pf Evans Lance) 08/17/2022    TDAP status: Due, Education has been provided regarding the importance of this vaccine. Advised may receive this vaccine at local pharmacy or Health Dept. Aware to  provide a copy of the vaccination record if obtained from local pharmacy or Health Dept. Verbalized acceptance and understanding.  Flu Vaccine:YES  Pneumococcal vaccine status: Up to date  Covid-19 vaccine status: Declined, Education has been provided regarding the importance of this vaccine but patient still declined. Advised may receive this vaccine at local pharmacy or Health Dept.or vaccine clinic. Aware to provide a copy of the vaccination record if obtained from local pharmacy or Health Dept. Verbalized acceptance and understanding.  Qualifies for Shingles Vaccine?   Zostavax completed  NO..had shingles   Screening Tests Health Maintenance  Topic Date Due   COVID-19 Vaccine (1) Never done   DTaP/Tdap/Td (1 - Tdap) Never done   Zoster Vaccines- Shingrix (1 of 2) Never done   COLONOSCOPY (Pts 45-70yr Insurance coverage will need to be confirmed)  02/15/2015   Medicare Annual Wellness (AWV)  10/11/2023   Pneumonia Vaccine 68 Years old  Completed   INFLUENZA VACCINE  Completed   Hepatitis C Screening  Completed   HPV VACCINES  Aged Out    Health Maintenance  Health Maintenance Due  Topic Date Due   COVID-19 Vaccine (1) Never done   DTaP/Tdap/Td (1 -  Tdap) Never done   Zoster Vaccines- Shingrix (1 of 2) Never done   COLONOSCOPY (Pts 45-68yr Insurance coverage will need to be confirmed)  02/15/2015    Colorectal cancer screening: Type of screening: Cologuard. Completed yes. Repeat every 3 years  Lung Cancer Screening: (Low Dose CT Chest recommended if Age 68-80years, 30 pack-year currently smoking OR have quit w/in 15years.) does qualify.   Lung Cancer Screening Referral: yes  Additional Screening:  Hepatitis C Screening: does qualify; Completed yes  Vision Screening: Recommended annual ophthalmology exams for early detection of glaucoma and other disorders of the eye. Is the patient up to date with their annual eye exam?  No  will make appt Who is the provider or  what is the name of the office in which the patient attends annual eye exams? Dr SRoyetta CrochetIf pt is not established with a provider, would they like to be referred to a provider to establish care? No .   Dental Screening: Recommended annual dental exams for proper oral hygiene  Community Resource Referral / Chronic Care Management: CRR required this visit?  No   CCM required this visit?  No      Plan:     I have personally reviewed and noted the following in the patient's chart:   Medical and social history Use of alcohol, tobacco or illicit drugs  Current medications and supplements including opioid prescriptions. Patient is not currently taking opioid prescriptions. Functional ability and status Nutritional status Physical activity Advanced directives List of other physicians Hospitalizations, surgeries, and ER visits in previous 12 months Vitals Screenings to include cognitive, depression, and falls Referrals and appointments  In addition, I have reviewed and discussed with patient certain preventive protocols, quality metrics, and best practice recommendations. A written personalized care plan for preventive services as well as general preventive health recommendations were provided to patient.     BRoger Shelter LPN   16/03/3709  Nurse Notes: Pt complains of reoccurring sore throat for a month now. No other symptoms:uses lozenges. Referral/order for lung Ca screening made

## 2022-10-29 ENCOUNTER — Other Ambulatory Visit: Payer: Self-pay | Admitting: Nephrology

## 2022-10-29 DIAGNOSIS — N1832 Chronic kidney disease, stage 3b: Secondary | ICD-10-CM

## 2022-10-29 DIAGNOSIS — I5022 Chronic systolic (congestive) heart failure: Secondary | ICD-10-CM | POA: Diagnosis not present

## 2022-11-07 ENCOUNTER — Ambulatory Visit
Admission: RE | Admit: 2022-11-07 | Discharge: 2022-11-07 | Disposition: A | Discharge status: Home / Self Care | Payer: Medicare HMO | Source: Ambulatory Visit | Attending: Nephrology | Admitting: Nephrology

## 2022-11-07 DIAGNOSIS — N189 Chronic kidney disease, unspecified: Secondary | ICD-10-CM | POA: Diagnosis not present

## 2022-11-07 DIAGNOSIS — N1832 Chronic kidney disease, stage 3b: Secondary | ICD-10-CM | POA: Insufficient documentation

## 2022-11-08 DIAGNOSIS — J449 Chronic obstructive pulmonary disease, unspecified: Secondary | ICD-10-CM | POA: Diagnosis not present

## 2022-11-12 DIAGNOSIS — I5022 Chronic systolic (congestive) heart failure: Secondary | ICD-10-CM | POA: Diagnosis not present

## 2022-11-12 DIAGNOSIS — K551 Chronic vascular disorders of intestine: Secondary | ICD-10-CM | POA: Diagnosis not present

## 2022-11-12 DIAGNOSIS — I351 Nonrheumatic aortic (valve) insufficiency: Secondary | ICD-10-CM | POA: Diagnosis not present

## 2022-11-12 DIAGNOSIS — R69 Illness, unspecified: Secondary | ICD-10-CM | POA: Diagnosis not present

## 2022-11-12 DIAGNOSIS — Z01818 Encounter for other preprocedural examination: Secondary | ICD-10-CM | POA: Diagnosis not present

## 2022-11-12 DIAGNOSIS — I7121 Aneurysm of the ascending aorta, without rupture: Secondary | ICD-10-CM | POA: Diagnosis not present

## 2022-11-12 DIAGNOSIS — I7143 Infrarenal abdominal aortic aneurysm, without rupture: Secondary | ICD-10-CM | POA: Diagnosis not present

## 2022-11-12 DIAGNOSIS — N183 Chronic kidney disease, stage 3 unspecified: Secondary | ICD-10-CM | POA: Diagnosis not present

## 2022-11-12 DIAGNOSIS — I774 Celiac artery compression syndrome: Secondary | ICD-10-CM | POA: Diagnosis not present

## 2022-11-12 DIAGNOSIS — R42 Dizziness and giddiness: Secondary | ICD-10-CM | POA: Diagnosis not present

## 2022-11-12 DIAGNOSIS — R0602 Shortness of breath: Secondary | ICD-10-CM | POA: Diagnosis not present

## 2022-11-19 ENCOUNTER — Other Ambulatory Visit: Payer: Self-pay | Admitting: Family Medicine

## 2022-11-19 DIAGNOSIS — L4050 Arthropathic psoriasis, unspecified: Secondary | ICD-10-CM

## 2022-11-20 NOTE — Telephone Encounter (Signed)
Requested medication (s) are due for refill today: yes  Requested medication (s) are on the active medication list: yes    Last refill: 05/29/21 10m  4 refills  Future visit scheduled no  Notes to clinic:  Not delegated, please review. Thank you.  Requested Prescriptions  Pending Prescriptions Disp Refills   methotrexate 50 MG/2ML injection [Pharmacy Med Name: Methotrexate Sodium 50 MG/2ML Injection Solution] 3 mL 0    Sig: INJECT  0.6 ML (CC) SUBCUTANEOUSLY ONCE A WEEK     Not Delegated - Immunology: Immunosuppressive Agents - methotrexate Failed - 11/19/2022 10:12 AM      Failed - This refill cannot be delegated      Failed - ALT in normal range and within 90 days    ALT  Date Value Ref Range Status  05/07/2022 7 0 - 44 IU/L Final         Failed - AST in normal range and within 90 days    AST  Date Value Ref Range Status  05/07/2022 14 0 - 40 IU/L Final         Failed - Cr in normal range and within 90 days    Creatinine, Ser  Date Value Ref Range Status  08/15/2022 1.93 (H) 0.76 - 1.27 mg/dL Final         Failed - HCT in normal range and within 90 days    Hematocrit  Date Value Ref Range Status  05/07/2022 45.7 37.5 - 51.0 % Final         Failed - HGB in normal range and within 90 days    Hemoglobin  Date Value Ref Range Status  05/07/2022 15.3 13.0 - 17.7 g/dL Final         Failed - PLT in normal range and within 90 days    Platelets  Date Value Ref Range Status  05/07/2022 246 150 - 450 x10E3/uL Final         Failed - WBC in normal range and within 90 days    WBC  Date Value Ref Range Status  05/07/2022 10.4 3.4 - 10.8 x10E3/uL Final  09/24/2021 11.3 (H) 4.0 - 10.5 K/uL Final         Failed - eGFR is 10 or above and within 90 days    GFR calc Af Amer  Date Value Ref Range Status  09/12/2020 56 (L) >59 mL/min/1.73 Final    Comment:    **In accordance with recommendations from the NKF-ASN Task force,**   [facility] is in the process of updating its  eGFR calculation to the   2021 CKD-EPI creatinine equation that estimates kidney function   without a race variable.    GFR, Estimated  Date Value Ref Range Status  09/24/2021 >60 >60 mL/min Final    Comment:    (NOTE) Calculated using the CKD-EPI Creatinine Equation (2021)    eGFR  Date Value Ref Range Status  08/15/2022 37 (L) >59 mL/min/1.73 Final         Passed - Albumin in normal range and within 360 days    Albumin  Date Value Ref Range Status  08/15/2022 4.4 3.9 - 4.9 g/dL Final         Passed - Patient is not pregnant      Passed - Valid encounter within last 3 months    Recent Outpatient Visits           3 months ago Stage 3a chronic kidney disease ([place]   [facility] Caryn Section, Kirstie Peri, MD   6 months ago Annual physical exam   [facility] Birdie Sons, MD   1 year ago Acute cough   [facility] Birdie Sons, MD   1 year ago Abie, Donald E, MD   1 year ago Pneumonia due to COVID-19 virus   [place], Kirstie Peri, MD

## 2022-11-26 DIAGNOSIS — I5022 Chronic systolic (congestive) heart failure: Secondary | ICD-10-CM | POA: Diagnosis not present

## 2022-11-26 DIAGNOSIS — N1832 Chronic kidney disease, stage 3b: Secondary | ICD-10-CM | POA: Diagnosis not present

## 2022-11-26 DIAGNOSIS — R809 Proteinuria, unspecified: Secondary | ICD-10-CM | POA: Diagnosis not present

## 2022-11-27 DIAGNOSIS — I480 Paroxysmal atrial fibrillation: Secondary | ICD-10-CM | POA: Diagnosis not present

## 2022-11-29 ENCOUNTER — Other Ambulatory Visit: Payer: Self-pay | Admitting: *Deleted

## 2022-11-29 DIAGNOSIS — J449 Chronic obstructive pulmonary disease, unspecified: Secondary | ICD-10-CM | POA: Diagnosis not present

## 2022-11-29 DIAGNOSIS — I10 Essential (primary) hypertension: Secondary | ICD-10-CM

## 2022-11-29 DIAGNOSIS — I7121 Aneurysm of the ascending aorta, without rupture: Secondary | ICD-10-CM | POA: Diagnosis not present

## 2022-11-29 DIAGNOSIS — R69 Illness, unspecified: Secondary | ICD-10-CM | POA: Diagnosis not present

## 2022-11-29 DIAGNOSIS — R0602 Shortness of breath: Secondary | ICD-10-CM | POA: Diagnosis not present

## 2022-11-29 DIAGNOSIS — F17298 Nicotine dependence, other tobacco product, with other nicotine-induced disorders: Secondary | ICD-10-CM | POA: Diagnosis not present

## 2022-11-29 DIAGNOSIS — R7303 Prediabetes: Secondary | ICD-10-CM

## 2022-11-29 DIAGNOSIS — G4733 Obstructive sleep apnea (adult) (pediatric): Secondary | ICD-10-CM | POA: Diagnosis not present

## 2022-12-02 ENCOUNTER — Other Ambulatory Visit: Payer: Self-pay | Admitting: Family Medicine

## 2022-12-02 DIAGNOSIS — E785 Hyperlipidemia, unspecified: Secondary | ICD-10-CM

## 2022-12-07 DIAGNOSIS — J449 Chronic obstructive pulmonary disease, unspecified: Secondary | ICD-10-CM | POA: Diagnosis not present

## 2022-12-11 ENCOUNTER — Telehealth: Payer: Self-pay

## 2022-12-11 NOTE — Progress Notes (Signed)
Care Management & Coordination Services Pharmacy Team  Reason for Encounter: Appointment Reminder  Contacted patient to confirm in office appointment with Daron Offer, PharmD on 12/14/2022 at 10:00 am.  Spoke with  spouse  on 12/11/2022   Do you have any problems getting your medications? Yes If yes what types of problems are you experiencing? Financial barriers, patient wife states they will have affordability issue when the patient is in the "donut hole" with his insurance.Patient wife states patient is not in the "donut hole" at this time.   What is your top health concern you would like to discuss at your upcoming visit?  Overall health  Have you seen any other providers since your last visit with PCP? Yes   Chart review:  Recent office visits:  10/10/2022 Laqueta Jean LPN (PCP Office) Medicare Wellness completed, No medication changes noted 08/15/2022 Dr. Caryn Section MD (PCP) No Medication changes noted  Recent consult visits:  11/29/2022 Dr. Raul Del MD (Pulmonology)No Medication changes noted, follow up in 18 weeks 11/26/2022 Dr. Holley Raring MD (Nephrology) Start Jardiance 10 mg daily, Return in about 4 months  11/12/2022 Tanzania Zwischenberger (Cardiothoracic Surgery) Unable to see note 10/29/2022 Dr. Holley Raring MD (Nephrology) No Medication changes noted, return in 4 weeks 08/21/2022 Dr. Clayborn Bigness MD (Cardiology) No Medication changes noted, return in 4 months 07/31/2022 Dr. Raul Del MD (Pulmonology) No Medication changes noted  Hospital visits:  None in previous 6 months   Star Rating Drugs:  Medication:Rosuvastatin 40 mg Last Fill:  12/03/2022 90 Day Supply at [facility]  Medication: Entresto 97-103 mg Last Fill:  11/20/2022 90 Day Supply at [facility]  Medication: Jardiance 100 mg Last Fill: 11/26/2022 90 Day Supply at [facility].   Care Gaps: Annual wellness visit in last year? YesLast completed 10/10/2022 COVID-19 Vaccine Dtap Vaccine Shingrix  Vaccine Colonoscopy  Anderson [place] Clinical Pharmacist Assistant [phone]

## 2022-12-12 DIAGNOSIS — Z720 Tobacco use: Secondary | ICD-10-CM | POA: Diagnosis not present

## 2022-12-12 DIAGNOSIS — I2510 Atherosclerotic heart disease of native coronary artery without angina pectoris: Secondary | ICD-10-CM | POA: Diagnosis not present

## 2022-12-12 DIAGNOSIS — U071 COVID-19: Secondary | ICD-10-CM | POA: Diagnosis not present

## 2022-12-12 DIAGNOSIS — I7121 Aneurysm of the ascending aorta, without rupture: Secondary | ICD-10-CM | POA: Diagnosis not present

## 2022-12-12 DIAGNOSIS — R55 Syncope and collapse: Secondary | ICD-10-CM | POA: Diagnosis not present

## 2022-12-12 DIAGNOSIS — J449 Chronic obstructive pulmonary disease, unspecified: Secondary | ICD-10-CM | POA: Diagnosis not present

## 2022-12-12 DIAGNOSIS — G4733 Obstructive sleep apnea (adult) (pediatric): Secondary | ICD-10-CM | POA: Diagnosis not present

## 2022-12-12 DIAGNOSIS — I5022 Chronic systolic (congestive) heart failure: Secondary | ICD-10-CM | POA: Diagnosis not present

## 2022-12-12 DIAGNOSIS — I471 Supraventricular tachycardia, unspecified: Secondary | ICD-10-CM | POA: Diagnosis not present

## 2022-12-12 DIAGNOSIS — I428 Other cardiomyopathies: Secondary | ICD-10-CM | POA: Diagnosis not present

## 2022-12-12 DIAGNOSIS — Z95810 Presence of automatic (implantable) cardiac defibrillator: Secondary | ICD-10-CM | POA: Diagnosis not present

## 2022-12-12 DIAGNOSIS — I480 Paroxysmal atrial fibrillation: Secondary | ICD-10-CM | POA: Diagnosis not present

## 2022-12-13 ENCOUNTER — Telehealth: Payer: Self-pay | Admitting: Family Medicine

## 2022-12-13 ENCOUNTER — Other Ambulatory Visit: Payer: Self-pay

## 2022-12-13 DIAGNOSIS — L4050 Arthropathic psoriasis, unspecified: Secondary | ICD-10-CM

## 2022-12-13 NOTE — Telephone Encounter (Signed)
[facility] faxed refill request for the following medications:  Methotrexate 25mg /ml inj   Please advise.

## 2022-12-14 ENCOUNTER — Ambulatory Visit: Payer: Medicare HMO

## 2022-12-14 DIAGNOSIS — I5022 Chronic systolic (congestive) heart failure: Secondary | ICD-10-CM

## 2022-12-14 DIAGNOSIS — J449 Chronic obstructive pulmonary disease, unspecified: Secondary | ICD-10-CM

## 2022-12-14 NOTE — Patient Instructions (Addendum)
Visit Information It was great speaking with you today!  Please let me know if you have any questions about our visit.  Plan:  Continue your current medications  Print copy of patient instructions, educational materials, and care plan provided in person.  Telephone follow up appointment with pharmacy team member scheduled for: 03/18/2023 at 11:00 AM   Junius Argyle, PharmD, Para March, CPP  Clinical Pharmacist Practitioner  [facility] Pharmacist Assistant  [phone]

## 2022-12-14 NOTE — Progress Notes (Unsigned)
Care Management & Coordination Services Pharmacy Note  12/14/2022 Name:  Dylan Lee MRN:  [id number] DOB:  10-Jul-1956  Summary: Patient presents for follow-up consult.   Recommendations/Changes made from today's visit: Continue current medications.   Follow up plan: CPP follow-up 3 months  Subjective: Dylan Lee is an 67 y.o. year old male who is a primary patient of Fisher, Kirstie Peri, MD.  The care coordination team was consulted for assistance with disease management and care coordination needs.    Engaged with patient by telephone for follow up visit.  Recent office visits: 10/10/2022 Laqueta Jean LPN (PCP Office) Medicare Wellness completed, No medication changes noted 08/15/2022 Dr. Caryn Section MD (PCP) No Medication changes noted  Recent consult visits: 11/29/2022 Dr. Raul Del MD (Pulmonology)No Medication changes noted, follow up in 18 weeks 11/26/2022 Dr. Holley Raring MD (Nephrology) Start Jardiance 10 mg daily, Return in about 4 months  11/12/2022 Tanzania Zwischenberger (Cardiothoracic Surgery) Unable to see note 10/29/2022 Dr. Holley Raring MD (Nephrology) No Medication changes noted, return in 4 weeks 08/21/2022 Dr. Clayborn Bigness MD (Cardiology) No Medication changes noted, return in 4 months 07/31/2022 Dr. Raul Del MD (Pulmonology) No Medication changes noted  Hospital visits: None in previous 6 months    Objective:  Lab Results  Component Value Date   CREATININE 1.93 (H) 08/15/2022   BUN 19 08/15/2022   EGFR 37 (L) 08/15/2022   GFRNONAA >60 09/24/2021   GFRAA 56 (L) 09/12/2020   NA 145 (H) 08/15/2022   K 4.6 08/15/2022   CALCIUM 9.4 08/15/2022   CO2 27 08/15/2022   GLUCOSE 122 (H) 08/15/2022    Lab Results  Component Value Date/Time   HGBA1C 5.9 (H) 05/07/2022 03:21 PM   HGBA1C 6.2 (A) 10/06/2021 02:05 PM   HGBA1C 5.7 (A) 09/12/2020 10:21 AM   HGBA1C 6.0 (H) 01/14/2019 09:36 AM   HGBA1C 5.8 09/06/2014 12:00 AM    Last diabetic Eye exam: No results found  for: "HMDIABEYEEXA"  Last diabetic Foot exam: No results found for: "HMDIABFOOTEX"   Lab Results  Component Value Date   CHOL 151 05/07/2022   HDL 33 (L) 05/07/2022   LDLCALC 91 05/07/2022   TRIG 150 (H) 05/07/2022   CHOLHDL 4.6 05/07/2022       Latest Ref Rng & Units 08/15/2022   10:38 AM 05/07/2022    3:21 PM 09/24/2021    9:28 PM  Hepatic Function  Total Protein 6.0 - 8.5 g/dL  6.5  5.7   Albumin 3.9 - 4.9 g/dL 4.4  4.3  2.5   AST 0 - 40 IU/L  14  30   ALT 0 - 44 IU/L  7  34   Alk Phosphatase 44 - 121 IU/L  72  36   Total Bilirubin 0.0 - 1.2 mg/dL  0.4  0.8     Lab Results  Component Value Date/Time   TSH 2.580 01/14/2019 09:36 AM   TSH 3.43 09/06/2014 12:00 AM       Latest Ref Rng & Units 05/07/2022    3:21 PM 09/24/2021    9:28 PM 09/12/2021    6:07 AM  CBC  WBC 3.4 - 10.8 x10E3/uL 10.4  11.3  18.3   Hemoglobin 13.0 - 17.7 g/dL 15.3  11.8  14.8   Hematocrit 37.5 - 51.0 % 45.7  36.0  43.6   Platelets 150 - 450 x10E3/uL 246  166  669     No results found for: "VD25OH", "VITAMINB12"  Clinical ASCVD: No  The 10-year ASCVD risk  score (Arnett DK, et al., 2019) is: 18%   Values used to calculate the score:     Age: 52 years     Sex: Male     Is Non-Hispanic African American: No     Diabetic: No     Tobacco smoker: Yes     Systolic Blood Pressure: [id number] mmHg     Is BP treated: Yes     HDL Cholesterol: 33 mg/dL     Total Cholesterol: 151 mg/dL       10/10/2022    1:11 PM 10/09/2021    1:52 PM 10/06/2021    1:56 PM  Depression screen PHQ 2/9  Decreased Interest 0 0 0  Down, Depressed, Hopeless 0 0 3  PHQ - 2 Score 0 0 3  Altered sleeping  0 0  Tired, decreased energy  3 3  Change in appetite  0 0  Feeling bad or failure about yourself   3 3  Trouble concentrating  1 1  Moving slowly or fidgety/restless  1 1  Suicidal thoughts  0 0  PHQ-9 Score  8 11  Difficult doing work/chores  Somewhat difficult Somewhat difficult     Social History   Tobacco Use   Smoking Status Some Days   Types: Cigars   Last attempt to quit: 09/17/1988   Years since quitting: 34.2  Smokeless Tobacco Never  Tobacco Comments   smokes 1-2 cigars daily; quit cigs. 30+ years ago   BP Readings from Last 3 Encounters:  08/15/22 135/82  05/07/22 (!) 153/83  11/13/21 (!) 141/92   Pulse Readings from Last 3 Encounters:  08/15/22 77  05/07/22 (!) 57  11/13/21 78   Wt Readings from Last 3 Encounters:  10/10/22 227 lb (103 kg)  08/15/22 227 lb (103 kg)  05/07/22 224 lb (101.6 kg)   BMI Readings from Last 3 Encounters:  10/10/22 31.66 kg/m  08/15/22 31.67 kg/m  05/07/22 31.26 kg/m    No Known Allergies  Medications Reviewed Today     Reviewed by Roger Shelter, LPN (Licensed Practical Nurse) on 10/10/22 at 19  Med List Status: <None>   Medication Order Taking? Sig Documenting Provider Last Dose Status Informant  amiodarone (PACERONE) 200 MG tablet [id number] Yes Take 200 mg by mouth daily. [provider] Taking Active   aspirin 81 MG tablet [id number] Yes Take 81 mg by mouth daily. [provider] Taking Active Pharmacy Records  ipratropium (ATROVENT HFA) 17 MCG/ACT inhaler [id number] Yes Inhale 2 puffs into the lungs every 8 (eight) hours. Lorella Nimrod, MD Taking Active   methotrexate 50 MG/2ML injection [id number] No INJECT 0.6 ML INTO THE SKIN ONCE A WEEK  Patient not taking: Reported on 10/10/2022   Birdie Sons, MD Not Taking Active Pharmacy Records           Med Note [place], [facility]   Fri Sep 08, 2021  2:36 AM) Last filled 07/17/21 28 day supply  metoprolol succinate (TOPROL-XL) 25 MG 24 hr tablet [id number] Yes Take 1 tablet (25 mg total) by mouth daily. Lorella Nimrod, MD Taking Active   montelukast (SINGULAIR) 10 MG tablet [id number] Yes TAKE 1 TABLET BY MOUTH AT BEDTIME Birdie Sons, MD Taking Active   omeprazole (PRILOSEC) 20 MG capsule [id number] Yes Take 20 mg by mouth daily. [provider] Taking Active    rosuvastatin (CRESTOR) 40 MG tablet [id number] Yes Take 1 tablet by mouth once daily Fisher, Kirstie Peri, MD Taking Active   sacubitril-valsartan (  ENTRESTO) 97-103 MG [id number] Yes Take 1 tablet by mouth 2 (two) times daily. Yolonda Kida, MD Taking Active Pharmacy Records           Med Note Girard Cooter May 07, 2022  3:36 PM)    spironolactone (ALDACTONE) 25 MG tablet [id number] Yes Take 25 mg by mouth daily. Half tablet [provider] Taking Active   SYMBICORT 160-4.5 MCG/ACT inhaler [id number] Yes INHALE 2 PUFFS INTO LUNGS TWICE DAILY Fisher, Kirstie Peri, MD Taking Active Pharmacy Records           Med Note [place], [facility]   Fri Sep 08, 2021  2:40 AM) Last filled 07/05/21 30 day supply  tamsulosin (FLOMAX) 0.4 MG CAPS capsule [id number] No Take 1 capsule (0.4 mg total) by mouth daily.  Patient not taking: Reported on 10/10/2022   Birdie Sons, MD Not Taking Active Pharmacy Records  torsemide [facility]) 20 MG tablet [id number] Yes Take 10 mg by mouth daily. [provider] Taking Active Pharmacy Records           Med Note ([place]   Wed Oct 10, 2022  1:09 PM) 20mg :two tablets daily  venlafaxine XR (EFFEXOR-XR) 150 MG 24 hr capsule [id number] Yes Take 1 capsule (150 mg total) by mouth daily. Take in addition to one 75mg  capsule daily Fisher, Kirstie Peri, MD Taking Active             SDOH:  (Social Determinants of Health) assessments and interventions performed: Yes SDOH Interventions    Flowsheet Row Clinical Support from 10/10/2022 in [place] from 10/09/2021 in [place] Office Visit from 10/06/2021 in [place] Office Visit from 12/13/2020 in [place] from 10/03/2020 in [place] Office Visit from 09/12/2020 in [place] Interventions         Food Insecurity Interventions Intervention Not Indicated Intervention Not Indicated -- -- -- --  Housing Interventions Intervention Not Indicated Intervention Not Indicated -- -- -- --  Transportation Interventions Intervention Not Indicated Intervention Not Indicated -- -- -- --  Utilities Interventions Intervention Not Indicated -- -- -- -- --  Alcohol Usage Interventions Intervention Not Indicated (Score <7) -- -- -- -- --  Depression Interventions/Treatment  -- Medication Currently on Treatment Currently on Treatment -- Medication  Financial Strain Interventions Intervention Not Indicated Intervention Not Indicated -- -- -- --  Physical Activity Interventions Intervention Not Indicated Intervention Not Indicated -- -- Patient Refused --  Stress Interventions Intervention Not Indicated Intervention Not Indicated -- -- -- --  Social Connections Interventions Intervention Not Indicated Intervention Not Indicated -- -- -- --       Medication Assistance: None required.  Patient affirms current coverage meets needs.  Medication Access: Within the past 30 days, how often has patient missed a dose of medication? None Is a pillbox or other method used to improve adherence? Yes  Factors that may affect medication adherence? no barriers identified Are meds synced by current pharmacy? No  Are meds delivered by current pharmacy? No  Does patient experience delays in picking up medications due to transportation concerns? No   [facility] Services Reviewed: Is patient disadvantaged to use [facility]?: No  Current Rx insurance plan: [employer] Name and location of Current pharmacy:  [facility] [place]  [place] [address] Phone: [phone] Fax: [phone]  [facility] services reviewed with patient today?: Yes  Patient requests to transfer care to [facility]?: No  Reason patient declined to change pharmacies: Loyalty to  other pharmacy/Patient preference  Compliance/Adherence/Medication fill history: Care Gaps: Annual wellness visit in last year? YesLast completed 10/10/2022 COVID-19 Vaccine Dtap Vaccine Shingrix Vaccine Colonoscopy  Star-Rating Drugs: Medication:Rosuvastatin 40 mg Last Fill:        12/03/2022 90 Day Supply at [facility]  Medication: Entresto 97-103 mg Last Fill:       11/20/2022 90 Day Supply at [facility]  Medication: Jardiance 100 mg Last Fill: 11/26/2022 90 Day Supply at [facility].    Assessment/Plan   Heart Failure (Goal: manage symptoms and prevent exacerbations) -Controlled -Last ejection fraction: 35-40% (Date: Jan 2023) -HF type: HFrEF (EF < 40%) -NYHA Class: II (slight limitation of activity) -AHA HF Stage: C (Heart disease and symptoms present) -Current treatment: Metoprolol XL 25 mg  Entresto 97-103 mg  Jardiance 10 mg daily  Spironolactone 25 mg  Torsemide 40 mg  -Medications previously tried: NA -Recommended to continue current medication  Atrial Fibrillation (Goal: prevent stroke and major bleeding) -Controlled -Current treatment: Rate control: Amiodarone 200 mg daily  Anticoagulation: None -Medications previously tried: NA -Unclear if there is a reason patient is not on anticoagulation.  -Recommended to continue current medication  Hyperlipidemia: (LDL goal < 70) -Controlled -History of CAD  -Current treatment: Rosuvastatin 40 mg  -Current treatment: Aspirin 81 40 mg  -Medications previously tried: NA \ -Recommended to continue current medication  COPD (Goal: control symptoms and prevent exacerbations) -Controlled -History of OSA, on chronic O2  -Current treatment  Atrovent HFA  Symbicort  Montelukast 10 mg  -Medications previously tried: Albuterol, Spiriva   -Gold Grade: Gold 4 (FEV1<40%) -Current COPD Classification:  A (low sx, 0-1 moderate exacerbations, no hospitalizations) -MMRC/CAT score: FEV1 39%  -Patient  reports consistent use of maintenance inhaler -Frequency of rescue inhaler use: 2-3x daily  -Recommended to continue current medication  Chronic Kidney Disease Stage 3b  -All medications assessed for renal dosing and appropriateness in chronic kidney disease. -Recommended to continue current medication  Junius Argyle, PharmD, BCACP, CPP  Clinical Pharmacist Practitioner  [facility] [phone]

## 2022-12-17 MED ORDER — METHOTREXATE SODIUM CHEMO INJECTION 50 MG/2ML: INTRAMUSCULAR | 5 refills | Status: DC

## 2023-01-01 DIAGNOSIS — H01009 Unspecified blepharitis unspecified eye, unspecified eyelid: Secondary | ICD-10-CM | POA: Diagnosis not present

## 2023-01-07 DIAGNOSIS — J449 Chronic obstructive pulmonary disease, unspecified: Secondary | ICD-10-CM | POA: Diagnosis not present

## 2023-01-07 NOTE — Progress Notes (Unsigned)
Vivien Rota DeSanto,acting as a scribe for Mila Merry, MD.,have documented all relevant documentation on the behalf of Mila Merry, MD,as directed by  Mila Merry, MD while in the presence of Mila Merry, MD.     Established patient visit   Patient: Dylan Lee   DOB: 10/01/1955   67 y.o. Male  MRN: [id number] Visit Date: 01/08/2023  Today's healthcare provider: Mila Merry, MD   No chief complaint on file.  Subjective    HPI  Hypertension, follow-up  BP Readings from Last 3 Encounters:  08/15/22 135/82  05/07/22 (!) 153/83  11/13/21 (!) 141/92   Wt Readings from Last 3 Encounters:  10/10/22 227 lb (103 kg)  08/15/22 227 lb (103 kg)  05/07/22 224 lb (101.6 kg)     He was last seen for hypertension {NUMBERS 1-12:18279} {days/wks/mos/yrs:310907} ago.  BP at that visit was ***. Management since that visit includes ***.  He reports {excellent/good/fair/poor:19665} compliance with treatment. He {is/is not:9024} having side effects. {document side effects if present:1} He is following a {diet:21022986} diet. He {is/is not:9024} exercising. He {does/does not:200015} smoke.  Use of agents associated with hypertension: {bp agents assoc with hypertension:511::"none"}.   Outside blood pressures are {***enter patient reported home BP readings, or 'not being checked':1}. Symptoms: {Yes/No:20286} chest pain {Yes/No:20286} chest pressure  {Yes/No:20286} palpitations {Yes/No:20286} syncope  {Yes/No:20286} dyspnea {Yes/No:20286} orthopnea  {Yes/No:20286} paroxysmal nocturnal dyspnea {Yes/No:20286} lower extremity edema   Pertinent labs Lab Results  Component Value Date   CHOL 151 05/07/2022   HDL 33 (L) 05/07/2022   LDLCALC 91 05/07/2022   TRIG 150 (H) 05/07/2022   CHOLHDL 4.6 05/07/2022   Lab Results  Component Value Date   NA 145 (H) 08/15/2022   K 4.6 08/15/2022   CREATININE 1.93 (H) 08/15/2022   EGFR 37 (L) 08/15/2022   GLUCOSE 122 (H) 08/15/2022   TSH  2.580 01/14/2019     The ASCVD Risk score (Arnett DK, et al., 2019) failed to calculate for the following reasons:   The systolic blood pressure is missing  --------------------------------------------------------------------------------------------------- Prediabetes, Follow-up  Lab Results  Component Value Date   HGBA1C 5.9 (H) 05/07/2022   HGBA1C 6.2 (A) 10/06/2021   HGBA1C 5.7 (A) 09/12/2020   GLUCOSE 122 (H) 08/15/2022   GLUCOSE 84 05/07/2022   GLUCOSE 152 (H) 09/24/2021    Last seen for for this8 months ago.  Management since that visit includes none. Current diet: {diet habits:16563} Current exercise: {exercise types:16438}  Pertinent Labs:    Component Value Date/Time   CHOL 151 05/07/2022 1521   TRIG 150 (H) 05/07/2022 1521   CHOLHDL 4.6 05/07/2022 1521   CREATININE 1.93 (H) 08/15/2022 1038    Wt Readings from Last 3 Encounters:  10/10/22 227 lb (103 kg)  08/15/22 227 lb (103 kg)  05/07/22 224 lb (101.6 kg)    -----------------------------------------------------------------------------------------   Medications: Outpatient Medications Prior to Visit  Medication Sig   albuterol (VENTOLIN HFA) 108 (90 Base) MCG/ACT inhaler Inhale 2 puffs into the lungs every 6 (six) hours as needed for wheezing or shortness of breath.   amiodarone (PACERONE) 200 MG tablet Take 200 mg by mouth daily.   aspirin 81 MG tablet Take 81 mg by mouth daily.   ipratropium (ATROVENT HFA) 17 MCG/ACT inhaler Inhale 2 puffs into the lungs every 8 (eight) hours.   methotrexate 50 MG/2ML injection INJECT  0.6 ML (CC) SUBCUTANEOUSLY ONCE A WEEK   montelukast (SINGULAIR) 10 MG tablet TAKE 1 TABLET BY MOUTH AT BEDTIME  omeprazole (PRILOSEC) 20 MG capsule Take 20 mg by mouth daily.   rosuvastatin (CRESTOR) 40 MG tablet Take 1 tablet by mouth once daily   sacubitril-valsartan (ENTRESTO) 97-103 MG Take 1 tablet by mouth 2 (two) times daily.   spironolactone (ALDACTONE) 25 MG tablet Take 12.5  mg by mouth daily. Half tablet   SYMBICORT 160-4.5 MCG/ACT inhaler INHALE 2 PUFFS INTO LUNGS TWICE DAILY   torsemide (DEMADEX) 20 MG tablet Take 40 mg by mouth daily.   venlafaxine XR (EFFEXOR-XR) 150 MG 24 hr capsule Take 1 capsule (150 mg total) by mouth daily. Take in addition to one  capsule daily   No facility-administered medications prior to visit.    Review of Systems  {Labs  Heme  Chem  Endocrine  Serology  Results Review (optional):23779}   Objective    There were no vitals taken for this visit. {Show previous vital signs (optional):23777}  Physical Exam  ***  No results found for any visits on 01/08/23.  Assessment & Plan     ***  No follow-ups on file.      {provider attestation***:1}   Mila Merry, MD  [facility] [phone] (phone) [phone] (fax)  [facility]

## 2023-01-08 ENCOUNTER — Encounter: Payer: Self-pay | Admitting: Family Medicine

## 2023-01-08 ENCOUNTER — Ambulatory Visit (INDEPENDENT_AMBULATORY_CARE_PROVIDER_SITE_OTHER): Payer: Medicare HMO | Admitting: Family Medicine

## 2023-01-08 VITALS — BP 110/65 | HR 70 | Temp 97.8°F | Resp 20 | Wt 224.0 lb

## 2023-01-08 DIAGNOSIS — R1310 Dysphagia, unspecified: Secondary | ICD-10-CM

## 2023-01-08 DIAGNOSIS — R768 Other specified abnormal immunological findings in serum: Secondary | ICD-10-CM | POA: Diagnosis not present

## 2023-01-08 DIAGNOSIS — I10 Essential (primary) hypertension: Secondary | ICD-10-CM | POA: Diagnosis not present

## 2023-01-08 DIAGNOSIS — Z7960 Long term (current) use of unspecified immunomodulators and immunosuppressants: Secondary | ICD-10-CM | POA: Diagnosis not present

## 2023-01-08 DIAGNOSIS — R7303 Prediabetes: Secondary | ICD-10-CM

## 2023-01-08 DIAGNOSIS — L400 Psoriasis vulgaris: Secondary | ICD-10-CM | POA: Diagnosis not present

## 2023-01-08 DIAGNOSIS — Z111 Encounter for screening for respiratory tuberculosis: Secondary | ICD-10-CM | POA: Diagnosis not present

## 2023-01-08 DIAGNOSIS — L4050 Arthropathic psoriasis, unspecified: Secondary | ICD-10-CM | POA: Diagnosis not present

## 2023-01-08 DIAGNOSIS — N1832 Chronic kidney disease, stage 3b: Secondary | ICD-10-CM

## 2023-01-08 LAB — POCT GLYCOSYLATED HEMOGLOBIN (HGB A1C)
Est. average glucose Bld gHb Est-mCnc: 111
Hemoglobin A1C: 5.5 % (ref 4.0–5.6)

## 2023-01-08 NOTE — Patient Instructions (Signed)
.   Please review the attached list of medications and notify my office if there are any errors.   . Please bring all of your medications to every appointment so we can make sure that our medication list is the same as yours.   

## 2023-01-30 ENCOUNTER — Ambulatory Visit: Admission: RE | Admit: 2023-01-30 | Payer: Medicare HMO | Source: Ambulatory Visit

## 2023-02-04 ENCOUNTER — Other Ambulatory Visit: Payer: Self-pay | Admitting: Emergency Medicine

## 2023-02-04 DIAGNOSIS — Z122 Encounter for screening for malignant neoplasm of respiratory organs: Secondary | ICD-10-CM

## 2023-02-04 DIAGNOSIS — Z87891 Personal history of nicotine dependence: Secondary | ICD-10-CM

## 2023-02-04 DIAGNOSIS — F17210 Nicotine dependence, cigarettes, uncomplicated: Secondary | ICD-10-CM

## 2023-02-06 DIAGNOSIS — J449 Chronic obstructive pulmonary disease, unspecified: Secondary | ICD-10-CM | POA: Diagnosis not present

## 2023-02-17 ENCOUNTER — Other Ambulatory Visit: Payer: Self-pay | Admitting: Family Medicine

## 2023-02-17 DIAGNOSIS — F32A Depression, unspecified: Secondary | ICD-10-CM

## 2023-02-18 NOTE — Telephone Encounter (Signed)
Requested Prescriptions  Pending Prescriptions Disp Refills   venlafaxine XR (EFFEXOR-XR) 150 MG 24 hr capsule [Pharmacy Med Name: Venlafaxine HCl ER 150 MG Oral Capsule Extended Release 24 Hour] 90 capsule 0    Sig: TAKE 1 CAPSULE BY MOUTH ONCE DAILY TAKE  IN  ADDITION  TO  ONE  75MG   CAPSULE  DAILY     Psychiatry: Antidepressants - SNRI - desvenlafaxine & venlafaxine Failed - 02/17/2023  1:07 PM      Failed - Cr in normal range and within 360 days    Creatinine, Ser  Date Value Ref Range Status  08/15/2022 1.93 (H) 0.76 - 1.27 mg/dL Final         Failed - Lipid Panel in normal range within the last 12 months    Cholesterol, Total  Date Value Ref Range Status  05/07/2022 151 100 - 199 mg/dL Final   LDL Chol Calc (NIH)  Date Value Ref Range Status  05/07/2022 91 0 - 99 mg/dL Final   HDL  Date Value Ref Range Status  05/07/2022 33 (L) >39 mg/dL Final   Triglycerides  Date Value Ref Range Status  05/07/2022 150 (H) 0 - 149 mg/dL Final         Passed - Completed PHQ-2 or PHQ-9 in the last 360 days      Passed - Last BP in normal range    BP Readings from Last 1 Encounters:  01/08/23 110/65         Passed - Valid encounter within last 6 months    Recent Outpatient Visits           1 month ago Pre-diabetes   [facility] [name], MD   6 months ago Stage 3a chronic kidney disease [facility])   [facility] [name], MD   9 months ago Annual physical exam   [facility] [name], MD   1 year ago Acute cough    [facility] [name], MD   1 year ago Pre-diabetes   [facility] [name], MD       Future Appointments             In 4 months Fisher, Demetrios Isaacs, MD [facility]

## 2023-02-19 DIAGNOSIS — G4733 Obstructive sleep apnea (adult) (pediatric): Secondary | ICD-10-CM | POA: Diagnosis not present

## 2023-02-26 DIAGNOSIS — I480 Paroxysmal atrial fibrillation: Secondary | ICD-10-CM | POA: Diagnosis not present

## 2023-02-27 ENCOUNTER — Other Ambulatory Visit: Payer: Self-pay | Admitting: Family Medicine

## 2023-02-27 DIAGNOSIS — J301 Allergic rhinitis due to pollen: Secondary | ICD-10-CM

## 2023-02-28 NOTE — Telephone Encounter (Signed)
Note in chart indicting he needs OV however he was seen 01/08/2023.   Note written prior to this date.   90 day refill given.

## 2023-03-04 ENCOUNTER — Encounter: Payer: Self-pay | Admitting: Physician Assistant

## 2023-03-04 ENCOUNTER — Ambulatory Visit (INDEPENDENT_AMBULATORY_CARE_PROVIDER_SITE_OTHER): Payer: Medicare HMO | Admitting: Physician Assistant

## 2023-03-04 DIAGNOSIS — F17210 Nicotine dependence, cigarettes, uncomplicated: Secondary | ICD-10-CM

## 2023-03-04 NOTE — Progress Notes (Signed)
Virtual Visit via Telephone Note  I connected with Dent Kelner on 03/04/23 at  8:58 AM  by telephone and verified that I am speaking with the correct person using two identifiers.  Location: Patient: home Provider: working virtually from home   I discussed the limitations, risks, security and privacy concerns of performing an evaluation and management service by telephone and the availability of in person appointments. I also discussed with the patient that there may be a patient responsible charge related to this service. The patient expressed understanding and agreed to proceed.     Shared Decision Making Visit Lung Cancer Screening Program [phone])   Eligibility: Age 67 Pack Years Smoking History Calculation 45 (# packs/per year x # years smoked) Recent History of coughing up blood  No Unexplained weight loss? No ( >Than 15 pounds within the last 6 months ) Prior History Lung / other cancer No (Diagnosis within the last 5 years already requiring surveillance chest CT Scans). Smoking Status Current Smoker  Visit Components: Discussion included one or more decision making aids. Yes Discussion included risk/benefits of screening. Yes Discussion included potential follow up diagnostic testing for abnormal scans. Yes Discussion included meaning and risk of over diagnosis. Yes Discussion included meaning and risk of False Positives. Yes Discussion included meaning of total radiation exposure. Yes  Counseling Included: Importance of adherence to annual lung cancer LDCT screening. Yes Impact of comorbidities on ability to participate in the program. Yes Ability and willingness to under diagnostic treatment: Yes  Smoking Cessation Counseling: Current Smokers:  Discussed importance of smoking cessation. Yes Information about tobacco cessation classes and interventions provided to patient. Yes Symptomatic Patient. No Diagnosis Code: Tobacco Use Z72.0 Asymptomatic Patient  Yes  Counseling (Intermediate counseling: > three minutes counseling) G2952 Information about tobacco cessation classes and interventions provided to patient. Yes Written Order for Lung Cancer Screening with LDCT placed in Epic. Yes (CT Chest Lung Cancer Screening Low Dose W/O CM) [id number] Z12.2-Screening of respiratory organs Z87.891-Personal history of nicotine dependence   I have spent 25 minutes of face to face/ virtual visit  time with the patient discussing the risks and benefits of lung cancer screening. We discussed the above noted topics. We paused at intervals to allow for questions to be asked and answered to ensure understanding.We discussed that the single most powerful action that anyone can take to decrease their risk of developing lung cancer is to quit smoking.  We discussed options for tools to aid in quitting smoking including nicotine replacement therapy, non-nicotine medications, support groups, Quit Smart classes, and behavior modification. We discussed that often times setting smaller, more achievable goals, such as eliminating 1 cigarette a day for a week and then 2 cigarettes a day for a week can be helpful in slowly decreasing the number of cigarettes smoked. I provided  them  with smoking cessation  information  with contact information for community resources, classes, free nicotine replacement therapy, and access to mobile apps, text messaging, and on-line smoking cessation help. I have also provided  them  the office contact information in the event they have any questions. We discussed the time and location of the scan, and that either Abigail Miyamoto RN, Karlton Lemon, RN  or I will call / send a letter with the results within 24-72 hours of receiving them. The patient verbalized understanding of all of  the above and had no further questions upon leaving the office. They have my contact information in the event they have any  further questions.  I spent two minutes counseling  on smoking cessation and the health risks of continued tobacco abuse.  I explained to the patient that there has been a high incidence of coronary artery disease noted on these exams. I explained that this is a non-gated exam therefore degree or severity cannot be determined. This patient is on statin therapy. I have asked the patient to follow-up with their PCP regarding any incidental finding of coronary artery disease and management with diet or medication as their PCP  feels is clinically indicated. The patient verbalized understanding of the above and had no further questions upon completion of the visit.    Darcella Gasman Solyana Nonaka, PA-C

## 2023-03-04 NOTE — Patient Instructions (Signed)
Thank you for participating in the [facility]. It was our pleasure to meet you today. We will call you with the results of your scan within the next few days. Your scan will be assigned a Lung RADS category score by the physicians reading the scans.  This Lung RADS score determines follow up scanning.  See below for description of categories, and follow up screening recommendations. We will be in touch to schedule your follow up screening annually or based on recommendations of our providers. We will fax a copy of your scan results to your Primary Care Physician, or the physician who referred you to the program, to ensure they have the results. Please call the office if you have any questions or concerns regarding your scanning experience or results.  Our office number is [phone]. Please speak with Denise Phelps, RN. , or  Denise Buckner RN, They are  our Lung Cancer Screening RN.'s If They are unavailable when you call, Please leave a message on the voice mail. We will return your call at our earliest convenience.This voice mail is monitored several times a day.  Remember, if your scan is normal, we will scan you annually as long as you continue to meet the criteria for the program. (Age 50-80, Current smoker or smoker who has quit within the last 15 years). If you are a smoker, remember, quitting is the single most powerful action that you can take to decrease your risk of lung cancer and other pulmonary, breathing related problems. We know quitting is hard, and we are here to help.  Please let us know if there is anything we can do to help you meet your goal of quitting. If you are a former smoker, congratulations. We are proud of you! Remain smoke free! Remember you can refer friends or family members through the number above.  We will screen them to make sure they meet criteria for the program. Thank you for helping us take better care of you by  participating in Lung Screening.  You can receive free nicotine replacement therapy ( patches, gum or mints) by calling 1-800-QUIT NOW. Please call so we can get you on the path to becoming  a non-smoker. I know it is hard, but you can do this!  Lung RADS Categories:  Lung RADS 1: no nodules or definitely non-concerning nodules.  Recommendation is for a repeat annual scan in 12 months.  Lung RADS 2:  nodules that are non-concerning in appearance and behavior with a very low likelihood of becoming an active cancer. Recommendation is for a repeat annual scan in 12 months.  Lung RADS 3: nodules that are probably non-concerning , includes nodules with a low likelihood of becoming an active cancer.  Recommendation is for a 6-month repeat screening scan. Often noted after an upper respiratory illness. We will be in touch to make sure you have no questions, and to schedule your 6-month scan.  Lung RADS 4 A: nodules with concerning findings, recommendation is most often for a follow up scan in 3 months or additional testing based on our provider's assessment of the scan. We will be in touch to make sure you have no questions and to schedule the recommended 3 month follow up scan.  Lung RADS 4 B:  indicates findings that are concerning. We will be in touch with you to schedule additional diagnostic testing based on our provider's  assessment of the scan.  Other options for assistance in smoking cessation (   As covered by your insurance benefits)  Hypnosis for smoking cessation  [employer] [phone]  Acupuncture for smoking cessation  [facility] [phone]   

## 2023-03-05 ENCOUNTER — Ambulatory Visit
Admission: RE | Admit: 2023-03-05 | Discharge: 2023-03-05 | Disposition: A | Discharge status: Home / Self Care | Payer: Medicare HMO | Source: Ambulatory Visit | Attending: Acute Care | Admitting: Acute Care

## 2023-03-05 DIAGNOSIS — F17210 Nicotine dependence, cigarettes, uncomplicated: Secondary | ICD-10-CM | POA: Insufficient documentation

## 2023-03-05 DIAGNOSIS — Z87891 Personal history of nicotine dependence: Secondary | ICD-10-CM | POA: Insufficient documentation

## 2023-03-05 DIAGNOSIS — Z122 Encounter for screening for malignant neoplasm of respiratory organs: Secondary | ICD-10-CM | POA: Diagnosis not present

## 2023-03-06 ENCOUNTER — Other Ambulatory Visit: Payer: Self-pay | Admitting: Family Medicine

## 2023-03-06 DIAGNOSIS — E785 Hyperlipidemia, unspecified: Secondary | ICD-10-CM

## 2023-03-06 NOTE — Telephone Encounter (Signed)
Rx 12/03/22 #90 1RF- too soon Requested Prescriptions  Pending Prescriptions Disp Refills   rosuvastatin (CRESTOR) 40 MG tablet [Pharmacy Med Name: Rosuvastatin Calcium 40 MG Oral Tablet] 90 tablet 0    Sig: Take 1 tablet by mouth once daily     Cardiovascular:  Antilipid - Statins 2 Failed - 03/06/2023  9:09 AM      Failed - Cr in normal range and within 360 days    Creatinine, Ser  Date Value Ref Range Status  08/15/2022 1.93 (H) 0.76 - 1.27 mg/dL Final         Failed - Lipid Panel in normal range within the last 12 months    Cholesterol, Total  Date Value Ref Range Status  05/07/2022 151 100 - 199 mg/dL Final   LDL Chol Calc (NIH)  Date Value Ref Range Status  05/07/2022 91 0 - 99 mg/dL Final   HDL  Date Value Ref Range Status  05/07/2022 33 (L) >39 mg/dL Final   Triglycerides  Date Value Ref Range Status  05/07/2022 150 (H) 0 - 149 mg/dL Final         Passed - Patient is not pregnant      Passed - Valid encounter within last 12 months    Recent Outpatient Visits           1 month ago Pre-diabetes   [facility] [name], MD   6 months ago Stage 3a chronic kidney disease [facility])   [facility] [name], MD   10 months ago Annual physical exam   [facility] [name], MD   1 year ago Acute cough   [facility] [name], MD   1 year ago Pre-diabetes   [facility] [name], MD       Future Appointments             In 4 months Fisher, Demetrios Isaacs, MD [facility]

## 2023-03-09 DIAGNOSIS — J449 Chronic obstructive pulmonary disease, unspecified: Secondary | ICD-10-CM | POA: Diagnosis not present

## 2023-03-12 ENCOUNTER — Other Ambulatory Visit: Payer: Self-pay

## 2023-03-12 DIAGNOSIS — Z122 Encounter for screening for malignant neoplasm of respiratory organs: Secondary | ICD-10-CM

## 2023-03-12 DIAGNOSIS — Z87891 Personal history of nicotine dependence: Secondary | ICD-10-CM

## 2023-03-12 DIAGNOSIS — F17210 Nicotine dependence, cigarettes, uncomplicated: Secondary | ICD-10-CM

## 2023-03-13 ENCOUNTER — Telehealth: Payer: Self-pay | Admitting: Family Medicine

## 2023-03-13 ENCOUNTER — Encounter: Payer: Self-pay | Admitting: Pharmacist

## 2023-03-13 DIAGNOSIS — I7121 Aneurysm of the ascending aorta, without rupture: Secondary | ICD-10-CM

## 2023-03-13 NOTE — Progress Notes (Signed)
Patient previously followed by [facility] pharmacist. Per clinical review, no pharmacist appointment needed at this time. Care guide directed to contact patient and cancel appointment and notify pharmacy team of any patient concerns.  

## 2023-03-13 NOTE — Telephone Encounter (Signed)
Chest CT shows aneurysm of artery in chest, it's not changed in the last year and not quite large enough to be dangerous, but should be followed by cardio-vascular surgeon. Have placed referral [facility] to follow up.

## 2023-03-15 NOTE — Telephone Encounter (Signed)
Patient was advised.  

## 2023-03-21 DIAGNOSIS — G4733 Obstructive sleep apnea (adult) (pediatric): Secondary | ICD-10-CM | POA: Diagnosis not present

## 2023-04-01 DIAGNOSIS — F17218 Nicotine dependence, cigarettes, with other nicotine-induced disorders: Secondary | ICD-10-CM | POA: Diagnosis not present

## 2023-04-01 DIAGNOSIS — G4733 Obstructive sleep apnea (adult) (pediatric): Secondary | ICD-10-CM | POA: Diagnosis not present

## 2023-04-01 DIAGNOSIS — I7121 Aneurysm of the ascending aorta, without rupture: Secondary | ICD-10-CM | POA: Diagnosis not present

## 2023-04-01 DIAGNOSIS — R0609 Other forms of dyspnea: Secondary | ICD-10-CM | POA: Diagnosis not present

## 2023-04-01 DIAGNOSIS — J449 Chronic obstructive pulmonary disease, unspecified: Secondary | ICD-10-CM | POA: Diagnosis not present

## 2023-04-04 DIAGNOSIS — N1832 Chronic kidney disease, stage 3b: Secondary | ICD-10-CM | POA: Diagnosis not present

## 2023-04-04 DIAGNOSIS — R809 Proteinuria, unspecified: Secondary | ICD-10-CM | POA: Diagnosis not present

## 2023-04-04 DIAGNOSIS — I5022 Chronic systolic (congestive) heart failure: Secondary | ICD-10-CM | POA: Diagnosis not present

## 2023-04-08 DIAGNOSIS — J449 Chronic obstructive pulmonary disease, unspecified: Secondary | ICD-10-CM | POA: Diagnosis not present

## 2023-04-09 DIAGNOSIS — L400 Psoriasis vulgaris: Secondary | ICD-10-CM | POA: Diagnosis not present

## 2023-04-09 DIAGNOSIS — Z7960 Long term (current) use of unspecified immunomodulators and immunosuppressants: Secondary | ICD-10-CM | POA: Diagnosis not present

## 2023-04-09 DIAGNOSIS — L4050 Arthropathic psoriasis, unspecified: Secondary | ICD-10-CM | POA: Diagnosis not present

## 2023-04-09 DIAGNOSIS — R768 Other specified abnormal immunological findings in serum: Secondary | ICD-10-CM | POA: Diagnosis not present

## 2023-04-21 DIAGNOSIS — G4733 Obstructive sleep apnea (adult) (pediatric): Secondary | ICD-10-CM | POA: Diagnosis not present

## 2023-05-09 DIAGNOSIS — J449 Chronic obstructive pulmonary disease, unspecified: Secondary | ICD-10-CM | POA: Diagnosis not present

## 2023-05-13 ENCOUNTER — Other Ambulatory Visit: Payer: Self-pay | Admitting: Family Medicine

## 2023-05-13 DIAGNOSIS — F32A Depression, unspecified: Secondary | ICD-10-CM

## 2023-05-23 ENCOUNTER — Other Ambulatory Visit: Payer: Self-pay | Admitting: Family Medicine

## 2023-05-23 DIAGNOSIS — J301 Allergic rhinitis due to pollen: Secondary | ICD-10-CM

## 2023-05-24 ENCOUNTER — Other Ambulatory Visit: Payer: Self-pay | Admitting: Family Medicine

## 2023-05-24 DIAGNOSIS — E785 Hyperlipidemia, unspecified: Secondary | ICD-10-CM

## 2023-05-28 DIAGNOSIS — I480 Paroxysmal atrial fibrillation: Secondary | ICD-10-CM | POA: Diagnosis not present

## 2023-06-09 DIAGNOSIS — J449 Chronic obstructive pulmonary disease, unspecified: Secondary | ICD-10-CM | POA: Diagnosis not present

## 2023-06-12 NOTE — Progress Notes (Signed)
[facility] Quality Team Note  Name: Dylan Lee Date of Birth: 1956-06-26 MRN: [id number] Date: 06/12/2023  [facility] Quality Team has reviewed this patient's chart, please see recommendations below:  [facility] Quality Other; (COL GAP- COLON CANCER SCREENING, PATIENT NEEDS A COLON SCREENING BEFORE 09/17/2023 FOR GAP CLOSURE. PLEASE ADDRESS AT UPCOMING VISIT WITH PCP 07/10/2023)

## 2023-06-20 DIAGNOSIS — I714 Abdominal aortic aneurysm, without rupture, unspecified: Secondary | ICD-10-CM | POA: Diagnosis not present

## 2023-06-20 DIAGNOSIS — I5022 Chronic systolic (congestive) heart failure: Secondary | ICD-10-CM | POA: Diagnosis not present

## 2023-06-20 DIAGNOSIS — I480 Paroxysmal atrial fibrillation: Secondary | ICD-10-CM | POA: Diagnosis not present

## 2023-06-20 DIAGNOSIS — I7121 Aneurysm of the ascending aorta, without rupture: Secondary | ICD-10-CM | POA: Diagnosis not present

## 2023-06-20 DIAGNOSIS — Z95810 Presence of automatic (implantable) cardiac defibrillator: Secondary | ICD-10-CM | POA: Diagnosis not present

## 2023-06-20 DIAGNOSIS — I428 Other cardiomyopathies: Secondary | ICD-10-CM | POA: Diagnosis not present

## 2023-06-20 DIAGNOSIS — J449 Chronic obstructive pulmonary disease, unspecified: Secondary | ICD-10-CM | POA: Diagnosis not present

## 2023-06-20 DIAGNOSIS — R55 Syncope and collapse: Secondary | ICD-10-CM | POA: Diagnosis not present

## 2023-06-20 DIAGNOSIS — I2510 Atherosclerotic heart disease of native coronary artery without angina pectoris: Secondary | ICD-10-CM | POA: Diagnosis not present

## 2023-06-20 DIAGNOSIS — G4733 Obstructive sleep apnea (adult) (pediatric): Secondary | ICD-10-CM | POA: Diagnosis not present

## 2023-06-20 DIAGNOSIS — Z720 Tobacco use: Secondary | ICD-10-CM | POA: Diagnosis not present

## 2023-06-20 DIAGNOSIS — I471 Supraventricular tachycardia, unspecified: Secondary | ICD-10-CM | POA: Diagnosis not present

## 2023-06-26 ENCOUNTER — Other Ambulatory Visit: Payer: Self-pay | Admitting: Internal Medicine

## 2023-06-26 DIAGNOSIS — I714 Abdominal aortic aneurysm, without rupture, unspecified: Secondary | ICD-10-CM

## 2023-07-09 DIAGNOSIS — J449 Chronic obstructive pulmonary disease, unspecified: Secondary | ICD-10-CM | POA: Diagnosis not present

## 2023-07-10 ENCOUNTER — Encounter: Payer: Self-pay | Admitting: Family Medicine

## 2023-07-10 ENCOUNTER — Ambulatory Visit (INDEPENDENT_AMBULATORY_CARE_PROVIDER_SITE_OTHER): Payer: Medicare HMO | Admitting: Family Medicine

## 2023-07-10 VITALS — BP 111/63 | HR 69 | Ht 70.0 in | Wt 210.6 lb

## 2023-07-10 DIAGNOSIS — E785 Hyperlipidemia, unspecified: Secondary | ICD-10-CM | POA: Diagnosis not present

## 2023-07-10 DIAGNOSIS — L4050 Arthropathic psoriasis, unspecified: Secondary | ICD-10-CM | POA: Diagnosis not present

## 2023-07-10 DIAGNOSIS — J449 Chronic obstructive pulmonary disease, unspecified: Secondary | ICD-10-CM

## 2023-07-10 DIAGNOSIS — N1831 Chronic kidney disease, stage 3a: Secondary | ICD-10-CM | POA: Diagnosis not present

## 2023-07-10 DIAGNOSIS — I7121 Aneurysm of the ascending aorta, without rupture: Secondary | ICD-10-CM

## 2023-07-10 DIAGNOSIS — I10 Essential (primary) hypertension: Secondary | ICD-10-CM

## 2023-07-10 DIAGNOSIS — Z125 Encounter for screening for malignant neoplasm of prostate: Secondary | ICD-10-CM

## 2023-07-10 DIAGNOSIS — Z23 Encounter for immunization: Secondary | ICD-10-CM | POA: Diagnosis not present

## 2023-07-10 DIAGNOSIS — N1832 Chronic kidney disease, stage 3b: Secondary | ICD-10-CM | POA: Diagnosis not present

## 2023-07-10 DIAGNOSIS — R7303 Prediabetes: Secondary | ICD-10-CM

## 2023-07-10 DIAGNOSIS — B182 Chronic viral hepatitis C: Secondary | ICD-10-CM

## 2023-07-10 DIAGNOSIS — R1313 Dysphagia, pharyngeal phase: Secondary | ICD-10-CM

## 2023-07-10 DIAGNOSIS — I5022 Chronic systolic (congestive) heart failure: Secondary | ICD-10-CM

## 2023-07-10 DIAGNOSIS — Z1211 Encounter for screening for malignant neoplasm of colon: Secondary | ICD-10-CM

## 2023-07-10 NOTE — Patient Instructions (Signed)
.   Please review the attached list of medications and notify my office if there are any errors.   . Please bring all of your medications to every appointment so we can make sure that our medication list is the same as yours.   

## 2023-07-11 ENCOUNTER — Telehealth: Payer: Self-pay

## 2023-07-11 ENCOUNTER — Other Ambulatory Visit: Payer: Self-pay

## 2023-07-11 DIAGNOSIS — Z1211 Encounter for screening for malignant neoplasm of colon: Secondary | ICD-10-CM

## 2023-07-11 LAB — LIPID PANEL
Chol/HDL Ratio: 4.2 ratio (ref 0.0–5.0)
Cholesterol, Total: 130 mg/dL (ref 100–199)
HDL: 31 mg/dL — ABNORMAL LOW (ref >39)
LDL Chol Calc (NIH): 70 mg/dL (ref 0–99)
Triglycerides: 172 mg/dL — ABNORMAL HIGH (ref 0–149)
VLDL Cholesterol Cal: 29 mg/dL (ref 5–40)

## 2023-07-11 LAB — CBC
Hematocrit: 49.1 % (ref 37.5–51.0)
Hemoglobin: 15.7 g/dL (ref 13.0–17.7)
MCH: 31.3 pg (ref 26.6–33.0)
MCHC: 32 g/dL (ref 31.5–35.7)
MCV: 98 fL — ABNORMAL HIGH (ref 79–97)
Platelets: 263 10*3/uL (ref 150–450)
RBC: 5.01 x10E6/uL (ref 4.14–5.80)
RDW: 13 % (ref 11.6–15.4)
WBC: 9.2 10*3/uL (ref 3.4–10.8)

## 2023-07-11 LAB — PARATHYROID HORMONE, INTACT (NO CA): PTH: 29 pg/mL (ref 15–65)

## 2023-07-11 LAB — RENAL FUNCTION PANEL
Albumin: 4.1 g/dL (ref 3.9–4.9)
BUN/Creatinine Ratio: 7 — ABNORMAL LOW (ref 10–24)
BUN: 14 mg/dL (ref 8–27)
CO2: 26 mmol/L (ref 20–29)
Calcium: 9.3 mg/dL (ref 8.6–10.2)
Chloride: 101 mmol/L (ref 96–106)
Creatinine, Ser: 2.07 mg/dL — ABNORMAL HIGH (ref 0.76–1.27)
Glucose: 136 mg/dL — ABNORMAL HIGH (ref 70–99)
Phosphorus: 3.3 mg/dL (ref 2.8–4.1)
Potassium: 5.3 mmol/L — ABNORMAL HIGH (ref 3.5–5.2)
Sodium: 141 mmol/L (ref 134–144)
eGFR: 34 mL/min/{1.73_m2} — ABNORMAL LOW (ref >59)

## 2023-07-11 LAB — HEMOGLOBIN A1C
Est. average glucose Bld gHb Est-mCnc: 117 mg/dL
Hgb A1c MFr Bld: 5.7 % — ABNORMAL HIGH (ref 4.8–5.6)

## 2023-07-11 LAB — TSH: TSH: 2.49 u[IU]/mL (ref 0.450–4.500)

## 2023-07-11 LAB — PSA: Prostate Specific Ag, Serum: 0.9 ng/mL (ref 0.0–4.0)

## 2023-07-11 MED ORDER — NA SULFATE-K SULFATE-MG SULF 17.5-3.13-1.6 GM/177ML PO SOLN: 1.0000 | Freq: Once | ORAL | 0 refills | Status: AC

## 2023-07-11 NOTE — Telephone Encounter (Signed)
Gastroenterology Pre-Procedure Review  Request Date: 08/07/23 Requesting Physician: Dr. Allegra Lai  PATIENT REVIEW QUESTIONS: The patient responded to the following health history questions as indicated:    1. Are you having any GI issues? no 2. Do you have a personal history of Polyps? no 3. Do you have a family history of Colon Cancer or Polyps? no 4. Diabetes Mellitus? no 5. Joint replacements in the past 12 months?no 6. Major health problems in the past 3 months?no 7. Any artificial heart valves, MVP, or defibrillator?no 8. Cardiac history? Cardiac clearnace    MEDICATIONS & ALLERGIES:    Patient reports the following regarding taking any anticoagulation/antiplatelet therapy:   Plavix, Coumadin, Eliquis, Xarelto, Lovenox, Pradaxa, Brilinta, or Effient? no Aspirin? no  Patient confirms/reports the following medications:  Current Outpatient Medications  Medication Sig Dispense Refill   albuterol (VENTOLIN HFA) 108 (90 Base) MCG/ACT inhaler Inhale 2 puffs into the lungs every 6 (six) hours as needed for wheezing or shortness of breath.     amiodarone (PACERONE) 200 MG tablet Take 200 mg by mouth daily.     aspirin 81 MG tablet Take 81 mg by mouth daily.     ipratropium (ATROVENT HFA) 17 MCG/ACT inhaler Inhale 2 puffs into the lungs every 8 (eight) hours. 1 each 12   JARDIANCE 10 MG TABS tablet Take 10 mg by mouth daily.     methotrexate 50 MG/2ML injection INJECT  0.6 ML (CC) SUBCUTANEOUSLY ONCE A WEEK 3 mL 5   montelukast (SINGULAIR) 10 MG tablet TAKE 1 TABLET BY MOUTH AT BEDTIME. NEED APPT FOR FURTHER REFILL 90 tablet 0   omeprazole (PRILOSEC) 20 MG capsule Take 20 mg by mouth daily.     rosuvastatin (CRESTOR) 40 MG tablet Take 1 tablet by mouth once daily 90 tablet 0   sacubitril-valsartan (ENTRESTO) 97-103 MG Take 1 tablet by mouth 2 (two) times daily.     spironolactone (ALDACTONE) 25 MG tablet Take 12.5 mg by mouth daily. Half tablet     SYMBICORT 160-4.5 MCG/ACT inhaler INHALE 2  PUFFS INTO LUNGS TWICE DAILY 11 g 12   torsemide (DEMADEX) 20 MG tablet Take 40 mg by mouth daily.     venlafaxine XR (EFFEXOR-XR) 150 MG 24 hr capsule TAKE 1 CAPSULE BY MOUTH ONCE DAILY TAKE  IN  ADDITION  TO  ONE  75  MG  DAILY 90 capsule 0   No current facility-administered medications for this visit.    Patient confirms/reports the following allergies:  No Known Allergies  No orders of the defined types were placed in this encounter.   AUTHORIZATION INFORMATION Primary Insurance: 1D#: Group #:  Secondary Insurance: 1D#: Group #:  SCHEDULE INFORMATION: Date: 08/07/23 Time: Location: [facility]

## 2023-07-15 ENCOUNTER — Telehealth: Payer: Self-pay

## 2023-07-15 ENCOUNTER — Encounter: Payer: Self-pay | Admitting: Family Medicine

## 2023-07-15 NOTE — Telephone Encounter (Signed)
Cardiac clearance has been granted from Dr. Juliann Pares for patients scheduled colonoscopy 08/07/23 with Dr. Allegra Lai.  Fax received 07/12/23.  Has been placed in basket to scan to chart.  Thanks,  [place], [place]

## 2023-07-20 NOTE — Progress Notes (Signed)
Established patient visit   Patient: Dylan Lee   DOB: [birth year]   67 y.o. Male  MRN: [id number] Visit Date: 07/10/2023  Today's healthcare provider: Mila Merry, MD   Chief Complaint  Patient presents with   Medical Management of Chronic Issues    6 month follow-up   Subjective    HPI Discussed the use of AI scribe software for clinical note transcription with the patient, who gave verbal consent to proceed.  History of Present Illness   The patient presents for follow up of many chronic medical condistions including HFpEF, CAD, COPD, hypertension, hyperlipidemia, CKD and prediabetes. He reports a decrease in the frequency of food getting stuck in his throat. Previously, this was occurring every other day, but has now reduced to approximately once every two weeks. The patient also reports intermittent stomach trouble, characterized by diarrhea and excess gas, occurring about four times in the last month. He has been managing these symptoms with over-the-counter Pepto-Bismol.  The patient also reports occasional shortness of breath with long walks, a symptom he has experienced since he started working. He has been feeling generally well, with some days of fatigue. His last cardiology follow up was with Dr. Juliann Pares on 10/3. He was referred to CHF clinic and schedule for chest CTA, although it doesn't appear either of these have been scheduled yet. He is followed at [facility] CV surgery for thoracic aneurysm with next appt scheduled for 10/2023   The patient has a history of colon polyps and is due for a colon cancer screening. He also has a history of kidney disease and is due for a follow-up with a kidney specialist in a couple of weeks. He has been having regular blood work done at the kidney specialist's office.       Medications: Outpatient Medications Prior to Visit  Medication Sig   albuterol (VENTOLIN HFA) 108 (90 Base) MCG/ACT inhaler Inhale 2 puffs into the lungs  every 6 (six) hours as needed for wheezing or shortness of breath.   amiodarone (PACERONE) 200 MG tablet Take 200 mg by mouth daily.   aspirin 81 MG tablet Take 81 mg by mouth daily.   ipratropium (ATROVENT HFA) 17 MCG/ACT inhaler Inhale 2 puffs into the lungs every 8 (eight) hours.   JARDIANCE 10 MG TABS tablet Take 10 mg by mouth daily.   montelukast (SINGULAIR) 10 MG tablet TAKE 1 TABLET BY MOUTH AT BEDTIME. NEED APPT FOR FURTHER REFILL   omeprazole (PRILOSEC) 20 MG capsule Take 20 mg by mouth daily.   rosuvastatin (CRESTOR) 40 MG tablet Take 1 tablet by mouth once daily   sacubitril-valsartan (ENTRESTO) 97-103 MG Take 1 tablet by mouth 2 (two) times daily.   spironolactone (ALDACTONE) 25 MG tablet Take 12.5 mg by mouth daily. Half tablet   torsemide (DEMADEX) 20 MG tablet Take 40 mg by mouth daily.   venlafaxine XR (EFFEXOR-XR) 150 MG 24 hr capsule TAKE 1 CAPSULE BY MOUTH ONCE DAILY TAKE  IN  ADDITION  TO  ONE  75  MG  DAILY   [DISCONTINUED] methotrexate 50 MG/2ML injection INJECT  0.6 ML (CC) SUBCUTANEOUSLY ONCE A WEEK   [DISCONTINUED] SYMBICORT 160-4.5 MCG/ACT inhaler INHALE 2 PUFFS INTO LUNGS TWICE DAILY   No facility-administered medications prior to visit.    Review of Systems  Constitutional:  Negative for appetite change, chills and fever.  Respiratory:  Negative for chest tightness, shortness of breath and wheezing.   Cardiovascular:  Negative for chest  pain and palpitations.  Gastrointestinal:  Negative for abdominal pain, nausea and vomiting.       Objective    BP 111/63 (BP Location: Left Arm, Patient Position: Sitting, Cuff Size: Normal)   Pulse 69   Ht 5\' 10"  (1.778 m)   Wt 210 lb 9.6 oz (95.5 kg)   SpO2 97%   BMI 30.22 kg/m     General: Appearance:    Obese male in no acute distress  Eyes:    PERRL, conjunctiva/corneas clear, EOM's intact       Lungs:     Clear to auscultation bilaterally, respirations unlabored  Heart:    Normal heart rate. Normal rhythm.  No murmurs, rubs, or gallops.    MS:   All extremities are intact.    Neurologic:   Awake, alert, oriented x 3. No apparent focal neurological defect.         Assessment & Plan     1. Pharyngeal dysphagia  - DG SWALLOW FUNC OP MEDICARE SPEECH PATH; Future  2. Chronic systolic heart failure (HCC) Followed by Dr. Juliann Pares, stable on current medications.   3. Chronic obstructive pulmonary disease, unspecified COPD type (HCC) Stable on current inhalers.   4. Chronic kidney disease, stage 3b (HCC) Check labs today.   5. Dyslipidemia (high LDL; low HDL) He is tolerating rosuvastatin well with no adverse effects.   - TSH - Lipid panel  6. Stage 3a chronic kidney disease (HCC)  - Renal function panel - CBC - Parathyroid hormone, intact (no Ca)  7. Psoriatic arthritis (HCC) Stable. Followed by Dr. Allena Katz.   8. Aneurysm of ascending aorta without rupture (HCC) Follow by [facility] vascular surgery.   9. Chronic hepatitis C without hepatic coma [facility]) s/p [facility] Hep[facility] consult 09/2010. Not candidate for triple therapy due to comorbidites, obesity, cigar smoking   10. Pre-diabetes  - Hemoglobin A1c  11. Prostate cancer screening  - PSA  12. Colon cancer screening  - Ambulatory referral to Gastroenterology  13. Influenza vaccine needed  - Flu Vaccine Trivalent High Dose (Fluad)  14. Primary hypertension Well controlled.  Continue current medications.     Return in about 6 months (around 01/08/2024) for Hypertension.         Mila Merry, MD  [facility] [phone] (phone) [phone] (fax)  [facility]

## 2023-07-22 ENCOUNTER — Telehealth: Payer: Self-pay

## 2023-07-22 NOTE — Telephone Encounter (Signed)
The patient wife Dylan Lee) called in to change her husband location for his procedure. She want the procedure to be done in [place] not [place].

## 2023-07-31 ENCOUNTER — Encounter: Payer: Self-pay | Admitting: Gastroenterology

## 2023-08-05 DIAGNOSIS — N1832 Chronic kidney disease, stage 3b: Secondary | ICD-10-CM | POA: Diagnosis not present

## 2023-08-05 DIAGNOSIS — N2581 Secondary hyperparathyroidism of renal origin: Secondary | ICD-10-CM | POA: Diagnosis not present

## 2023-08-05 DIAGNOSIS — I5022 Chronic systolic (congestive) heart failure: Secondary | ICD-10-CM | POA: Diagnosis not present

## 2023-08-07 ENCOUNTER — Ambulatory Visit: Payer: Medicare HMO | Admitting: General Practice

## 2023-08-07 ENCOUNTER — Encounter
Admission: RE | Disposition: A | Discharge status: Home / Self Care | Payer: Self-pay | Source: Home / Self Care | Attending: Gastroenterology

## 2023-08-07 ENCOUNTER — Ambulatory Visit
Admission: RE | Admit: 2023-08-07 | Discharge: 2023-08-07 | Disposition: A | Discharge status: Home / Self Care | Payer: Medicare HMO | Attending: Gastroenterology | Admitting: Gastroenterology

## 2023-08-07 DIAGNOSIS — Z1211 Encounter for screening for malignant neoplasm of colon: Secondary | ICD-10-CM | POA: Diagnosis not present

## 2023-08-07 DIAGNOSIS — Z95810 Presence of automatic (implantable) cardiac defibrillator: Secondary | ICD-10-CM | POA: Diagnosis not present

## 2023-08-07 DIAGNOSIS — K635 Polyp of colon: Secondary | ICD-10-CM

## 2023-08-07 DIAGNOSIS — J449 Chronic obstructive pulmonary disease, unspecified: Secondary | ICD-10-CM | POA: Insufficient documentation

## 2023-08-07 DIAGNOSIS — I509 Heart failure, unspecified: Secondary | ICD-10-CM | POA: Diagnosis not present

## 2023-08-07 DIAGNOSIS — K219 Gastro-esophageal reflux disease without esophagitis: Secondary | ICD-10-CM | POA: Diagnosis not present

## 2023-08-07 DIAGNOSIS — D125 Benign neoplasm of sigmoid colon: Secondary | ICD-10-CM | POA: Diagnosis not present

## 2023-08-07 DIAGNOSIS — D122 Benign neoplasm of ascending colon: Secondary | ICD-10-CM | POA: Insufficient documentation

## 2023-08-07 DIAGNOSIS — I5022 Chronic systolic (congestive) heart failure: Secondary | ICD-10-CM | POA: Diagnosis not present

## 2023-08-07 DIAGNOSIS — D123 Benign neoplasm of transverse colon: Secondary | ICD-10-CM | POA: Diagnosis not present

## 2023-08-07 DIAGNOSIS — K644 Residual hemorrhoidal skin tags: Secondary | ICD-10-CM | POA: Insufficient documentation

## 2023-08-07 DIAGNOSIS — F17290 Nicotine dependence, other tobacco product, uncomplicated: Secondary | ICD-10-CM | POA: Insufficient documentation

## 2023-08-07 DIAGNOSIS — G4733 Obstructive sleep apnea (adult) (pediatric): Secondary | ICD-10-CM | POA: Diagnosis not present

## 2023-08-07 DIAGNOSIS — K562 Volvulus: Secondary | ICD-10-CM

## 2023-08-07 DIAGNOSIS — Z9981 Dependence on supplemental oxygen: Secondary | ICD-10-CM | POA: Diagnosis not present

## 2023-08-07 DIAGNOSIS — F17210 Nicotine dependence, cigarettes, uncomplicated: Secondary | ICD-10-CM | POA: Insufficient documentation

## 2023-08-07 DIAGNOSIS — I2510 Atherosclerotic heart disease of native coronary artery without angina pectoris: Secondary | ICD-10-CM | POA: Diagnosis not present

## 2023-08-07 DIAGNOSIS — N1832 Chronic kidney disease, stage 3b: Secondary | ICD-10-CM | POA: Diagnosis not present

## 2023-08-07 DIAGNOSIS — D179 Benign lipomatous neoplasm, unspecified: Secondary | ICD-10-CM

## 2023-08-07 DIAGNOSIS — I130 Hypertensive heart and chronic kidney disease with heart failure and stage 1 through stage 4 chronic kidney disease, or unspecified chronic kidney disease: Secondary | ICD-10-CM | POA: Diagnosis not present

## 2023-08-07 DIAGNOSIS — D124 Benign neoplasm of descending colon: Secondary | ICD-10-CM | POA: Insufficient documentation

## 2023-08-07 DIAGNOSIS — I110 Hypertensive heart disease with heart failure: Secondary | ICD-10-CM | POA: Insufficient documentation

## 2023-08-07 DIAGNOSIS — K649 Unspecified hemorrhoids: Secondary | ICD-10-CM | POA: Diagnosis not present

## 2023-08-07 HISTORY — PX: COLONOSCOPY WITH PROPOFOL: SHX5780

## 2023-08-07 HISTORY — PX: POLYPECTOMY: SHX5525

## 2023-08-07 HISTORY — PX: HEMOSTASIS CLIP PLACEMENT: SHX6857

## 2023-08-07 HISTORY — PX: HOT HEMOSTASIS: SHX5433

## 2023-08-07 SURGERY — COLONOSCOPY WITH PROPOFOL
Anesthesia: General

## 2023-08-07 MED ORDER — LIDOCAINE HCL (PF) 2 % IJ SOLN
INTRAMUSCULAR | Status: DC | PRN
  Administered 2023-08-07 (×2): 100 mg via INTRADERMAL

## 2023-08-07 MED ORDER — SODIUM CHLORIDE 0.9 % IV SOLN
INTRAVENOUS | Status: DC
Start: 2023-08-07 — End: 2023-08-07
  Administered 2023-08-07: 20 mL/h via INTRAVENOUS

## 2023-08-07 MED ORDER — PROPOFOL 500 MG/50ML IV EMUL
INTRAVENOUS | Status: DC | PRN
  Administered 2023-08-07: 50 mg via INTRAVENOUS
  Administered 2023-08-07: 30 ug/kg/min via INTRAVENOUS
  Administered 2023-08-07: 50 mg via INTRAVENOUS

## 2023-08-07 MED ORDER — PHENYLEPHRINE HCL-NACL 20-0.9 MG/250ML-% IV SOLN
INTRAVENOUS | Status: DC | PRN
Start: 2023-08-07 — End: 2023-08-07
  Administered 2023-08-07: 20 ug/min via INTRAVENOUS

## 2023-08-07 NOTE — H&P (Signed)
Dylan Repress, MD [address]  [address]  [place], [place] [address]  Main: [phone]  Fax: [phone] Pager: [phone]  Primary Care Physician:  [name], MD Primary Gastroenterologist:  Dr. Arlyss Lee  Pre-Procedure History & Physical: HPI:  Dylan Lee is a 67 y.o. male is here for an colonoscopy.   Past Medical History:  Diagnosis Date   CHF (congestive heart failure) (HCC)    Encephalopathy due to COVID-19 virus 09/07/2021   Hepatitis C    Hep C treated    History of chicken pox    History of measles    History of mumps    Hyperlipidemia    Hypertension    Kidney stones    Pneumonia due to COVID-19 virus 09/07/2021   Shingles 09/06/2015    Past Surgical History:  Procedure Laterality Date   BUNIONECTOMY     CARDIAC CATHETERIZATION N/A 03/02/2015   Procedure: Right and Left Heart Cath;  Surgeon: Alwyn Pea, MD;  Location: [facility] INVASIVE CV LAB;  Service: Cardiovascular;  Laterality: N/A;   DENTAL SURGERY     FOOT SURGERY Right    IMPLANTABLE CARDIOVERTER DEFIBRILLATOR (ICD) GENERATOR CHANGE Right 06/03/2015   Procedure: ICD generator insertion ;  Surgeon: Sharion Settler, MD;  Location: [facility] ORS;  Service: Cardiovascular;  Laterality: Right;   LITHOTRIPSY     sleep study  07/2010   severe OSA, snoring. Bilevel pressure at 19/14. Jiengel. ENT consult/ Jiengel: no ENT surgical indication for OSA   SPIROMETRY  10/20/2014   Severe obstruction    Prior to Admission medications   Medication Sig Start Date End Date Taking? Authorizing Provider  albuterol (VENTOLIN HFA) 108 (90 Base) MCG/ACT inhaler Inhale 2 puffs into the lungs every 6 (six) hours as needed for wheezing or shortness of breath.    [provider]  amiodarone (PACERONE) 200 MG tablet Take 200 mg by mouth daily.    [provider]  aspirin 81 MG tablet Take 81 mg by mouth daily.    [provider]  ipratropium (ATROVENT HFA) 17 MCG/ACT inhaler  Inhale 2 puffs into the lungs every 8 (eight) hours. 09/13/21   Arnetha Courser, MD  JARDIANCE 10 MG TABS tablet Take 10 mg by mouth daily.    Lateef, Munsoor, MD  montelukast (SINGULAIR) 10 MG tablet TAKE 1 TABLET BY MOUTH AT BEDTIME. NEED APPT FOR FURTHER REFILL 05/24/23   [name], MD  omeprazole (PRILOSEC) 20 MG capsule Take 20 mg by mouth daily.    [provider]  rosuvastatin (CRESTOR) 40 MG tablet Take 1 tablet by mouth once daily 05/24/23   [name], MD  sacubitril-valsartan (ENTRESTO) 97-103 MG Take 1 tablet by mouth 2 (two) times daily.    Callwood, Dwayne D, MD  spironolactone (ALDACTONE) 25 MG tablet Take 12.5 mg by mouth daily. Half tablet    [provider]  torsemide (DEMADEX) 20 MG tablet Take 40 mg by mouth daily. 06/11/19   [provider]  venlafaxine XR (EFFEXOR-XR) 150 MG 24 hr capsule TAKE 1 CAPSULE BY MOUTH ONCE DAILY TAKE  IN  ADDITION  TO  ONE  75  MG  DAILY 05/14/23   [name], MD    Allergies as of 07/11/2023 - Review Complete 07/11/2023  Allergen Reaction Noted   Quinolones Other (See Comments) 11/12/2022    Family History  Problem Relation Age of Onset   Hypertension Mother    Bipolar disorder Mother  Ovarian cancer Mother    Heart disease Mother    Diabetes Father    Alcohol abuse Father    CAD Father    Depression Father    Alcohol abuse Sister    Congestive Heart Failure Brother    Alcohol abuse Brother    Liver cancer Brother     Social History   Socioeconomic History   Marital status: Married    Spouse name: Not on file   Number of children: 0   Years of education: Not on file   Highest education level: 12th grade  Occupational History   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 30+ years ago  Vaping Use   Vaping status: Never Used  Substance and Sexual Activity   Alcohol use: No    Alcohol/week: 0.0 standard drinks of alcohol   Drug use: Yes   Sexual activity: Not on file  Other Topics Concern   Not on file  Social History Narrative   Not on file   Social Determinants of Health   Financial Resource Strain: Low Risk  (07/09/2023)   Overall Financial Resource Strain (CARDIA)    Difficulty of Paying Living Expenses: Not hard at all  Food Insecurity: No Food Insecurity (07/09/2023)   Hunger Vital Sign    Worried About Running Out of Food in the Last Year: Never true    Ran Out of Food in the Last Year: Never true  Transportation Needs: No Transportation Needs (07/09/2023)   PRAPARE - Administrator, Civil Service (Medical): No    Lack of Transportation (Non-Medical): No  Physical Activity: Inactive (07/09/2023)   Exercise Vital Sign    Days of Exercise per Week: 0 days    Minutes of Exercise per Session: 0 min  Stress: No Stress Concern Present (07/09/2023)   [employer] of Occupational Health - Occupational Stress Questionnaire    Feeling of Stress : Only a little  Social Connections: Socially Isolated (07/09/2023)   Social Connection and Isolation Panel [NHANES]    Frequency of Communication with Friends and Family: Once a week    Frequency of Social Gatherings with Friends and Family: Once a week    Attends Religious Services: Never    Database administrator or Organizations: No    Attends Banker Meetings: Never    Marital Status: Married  Catering manager Violence: Not At Risk (10/10/2022)   Humiliation, Afraid, Rape, and Kick questionnaire    Fear of Current or Ex-Partner: No    Emotionally Abused: No    Physically Abused: No    Sexually Abused: No    Review of Systems: See HPI, otherwise negative ROS  Physical Exam: BP  119/85   Pulse 72   Temp (!) 96 F (35.6 C) (Temporal)   Resp 20   Ht 5\' 10"  (1.778 m)   Wt 93.1 kg   SpO2 97%   BMI 29.44 kg/m  General:   Alert,  pleasant and cooperative in NAD Head:  Normocephalic and atraumatic. Neck:  Supple; no masses or thyromegaly. Lungs:  Clear throughout to auscultation.    Heart:  Regular rate  and rhythm. Abdomen:  Soft, nontender and nondistended. Normal bowel sounds, without guarding, and without rebound.   Neurologic:  Alert and  oriented x4;  grossly normal neurologically.  Impression/Plan: Dylan Lee is here for an colonoscopy to be performed for colon cancer screening  Risks, benefits, limitations, and alternatives regarding  colonoscopy have been reviewed with the patient.  Questions have been answered.  All parties agreeable.   Lannette Donath, MD  08/07/2023, 9:24 AM

## 2023-08-07 NOTE — Anesthesia Postprocedure Evaluation (Signed)
Anesthesia Post Note  Patient: Dylan Lee  Procedure(s) Performed: COLONOSCOPY WITH PROPOFOL POLYPECTOMY HEMOSTASIS CLIP PLACEMENT HOT HEMOSTASIS (ARGON PLASMA COAGULATION/BICAP)  Patient location during evaluation: PACU Anesthesia Type: General Level of consciousness: awake and alert Pain management: pain level controlled Vital Signs Assessment: post-procedure vital signs reviewed and stable Respiratory status: spontaneous breathing, nonlabored ventilation, respiratory function stable and patient connected to nasal cannula oxygen Cardiovascular status: blood pressure returned to baseline and stable Postop Assessment: no apparent nausea or vomiting Anesthetic complications: no  No notable events documented.   Last Vitals:  Vitals:   08/07/23 0823 08/07/23 1014  BP: 119/85 (!) 96/56  Pulse: 72 61  Resp: 20 19  Temp: (!) 35.6 C (!) 36.1 C  SpO2: 97% 98%    Last Pain:  Vitals:   08/07/23 1014  TempSrc: Temporal  PainSc: Asleep                 Stephanie Coup

## 2023-08-07 NOTE — Anesthesia Preprocedure Evaluation (Signed)
Anesthesia Evaluation  Patient identified by MRN, date of birth, ID band Patient awake    Reviewed: Allergy & Precautions, NPO status , Patient's Chart, lab work & pertinent test results  History of Anesthesia Complications Negative for: history of anesthetic complications  Airway Mallampati: III  TM Distance: >3 FB Neck ROM: full    Dental  (+) Upper Dentures, Lower Dentures   Pulmonary neg pulmonary ROS, shortness of breath and with exertion, sleep apnea (uses BiPAP) and Continuous Positive Airway Pressure Ventilation , COPD,  COPD inhaler and oxygen dependent, Current Smoker   Pulmonary exam normal        Cardiovascular hypertension, Pt. on medications and Pt. on home beta blockers (-) angina + CAD, + Past MI and +CHF  negative cardio ROS Normal cardiovascular exam(-) pacemaker+ Cardiac Defibrillator      Neuro/Psych  PSYCHIATRIC DISORDERS      negative neurological ROS  negative psych ROS   GI/Hepatic negative GI ROS, Neg liver ROS,GERD  Medicated and Controlled,,(+) Hepatitis -, C  Endo/Other  negative endocrine ROS    Renal/GU Renal disease  negative genitourinary   Musculoskeletal   Abdominal   Peds  Hematology negative hematology ROS (+)   Anesthesia Other Findings Past Medical History: No date: CHF (congestive heart failure) (HCC) 09/07/2021: Encephalopathy due to COVID-19 virus No date: Hepatitis C     Comment:  Hep C treated  No date: History of chicken pox No date: History of measles No date: History of mumps No date: Hyperlipidemia No date: Hypertension No date: Kidney stones 09/07/2021: Pneumonia due to COVID-19 virus 09/06/2015: Shingles  Past Surgical History: No date: BUNIONECTOMY 03/02/2015: CARDIAC CATHETERIZATION; N/A     Comment:  Procedure: Right and Left Heart Cath;  Surgeon: Alwyn Pea, MD;  Location: [facility] INVASIVE CV LAB;  Service:                Cardiovascular;  Laterality: N/A; No date: DENTAL SURGERY No date: FOOT SURGERY; Right 06/03/2015: IMPLANTABLE CARDIOVERTER DEFIBRILLATOR (ICD) GENERATOR  CHANGE; Right     Comment:  Procedure: ICD generator insertion ;  Surgeon: Sharion Settler, MD;  Location: [facility] ORS;  Service:               Cardiovascular;  Laterality: Right; No date: LITHOTRIPSY 07/2010: sleep study     Comment:  severe OSA, snoring. Bilevel pressure at 19/14. Jiengel.              ENT consult/ Jiengel: no ENT surgical indication for OSA 10/20/2014: SPIROMETRY     Comment:  Severe obstruction  BMI    Body Mass Index: 29.44 kg/m      Reproductive/Obstetrics negative OB ROS                             Anesthesia Physical Anesthesia Plan  ASA: 3  Anesthesia Plan: General   Post-op Pain Management: Minimal or no pain anticipated   Induction: Intravenous  PONV Risk Score and Plan: 2 and Propofol infusion, TIVA and Ondansetron  Airway Management Planned: Nasal Cannula  Additional Equipment: None  Intra-op Plan:   Post-operative Plan:   Informed Consent: I have reviewed the patients History and Physical, chart, labs and discussed the procedure including the risks, benefits and alternatives for the proposed anesthesia with  the patient or authorized representative who has indicated his/her understanding and acceptance.     Dental advisory given  Plan Discussed with: CRNA and Surgeon  Anesthesia Plan Comments: (Discussed risks of anesthesia with patient, including possibility of difficulty with spontaneous ventilation under anesthesia necessitating airway intervention, PONV, and rare risks such as cardiac or respiratory or neurological events, and allergic reactions. Discussed the role of CRNA in patient's perioperative care. Patient understands.)       Anesthesia Quick Evaluation

## 2023-08-07 NOTE — Op Note (Signed)
[facility] Patient Name: Dylan Lee Procedure Date: 08/07/2023 9:13 AM MRN: [id number] Account #: [id number] Date of Birth: 1955/09/28 Admit Type: Outpatient Age: 67 Room: [facility] ENDO ROOM 1 Gender: Male Note Status: Finalized Instrument Name: Colonscope [id number] Procedure:             Colonoscopy Indications:           Screening for colorectal malignant neoplasm, Last                         colonoscopy: May 2006 Providers:             Toney Reil MD, MD Referring MD:          Demetrios Isaacs. Sherrie Mustache, MD (Referring MD) Medicines:             General Anesthesia Complications:         No immediate complications. Estimated blood loss: None. Procedure:             Pre-Anesthesia Assessment:                        - Prior to the procedure, a History and Physical was                         performed, and patient medications and allergies were                         reviewed. The patient is competent. The risks and                         benefits of the procedure and the sedation options and                         risks were discussed with the patient. All questions                         were answered and informed consent was obtained.                         Patient identification and proposed procedure were                         verified by the physician, the nurse, the                         anesthesiologist, the anesthetist and the technician                         in the pre-procedure area in the procedure room in the                         endoscopy suite. Mental Status Examination: alert and                         oriented. Airway Examination: normal oropharyngeal                         airway and neck mobility. Respiratory Examination:  clear to auscultation. CV Examination: normal.                         Prophylactic Antibiotics: The patient does not require                         prophylactic  antibiotics. Prior Anticoagulants: The                         patient has taken no anticoagulant or antiplatelet                         agents. ASA Grade Assessment: III - A patient with                         severe systemic disease. After reviewing the risks and                         benefits, the patient was deemed in satisfactory                         condition to undergo the procedure. The anesthesia                         plan was to use general anesthesia. Immediately prior                         to administration of medications, the patient was                         re-assessed for adequacy to receive sedatives. The                         heart rate, respiratory rate, oxygen saturations,                         blood pressure, adequacy of pulmonary ventilation, and                         response to care were monitored throughout the                         procedure. The physical status of the patient was                         re-assessed after the procedure.                        After obtaining informed consent, the colonoscope was                         passed under direct vision. Throughout the procedure,                         the patient's blood pressure, pulse, and oxygen                         saturations were monitored continuously. The  Colonoscope was introduced through the anus and                         advanced to the the cecum, identified by appendiceal                         orifice and ileocecal valve. The colonoscopy was                         performed with moderate difficulty due to significant                         looping and the patient's body habitus. Successful                         completion of the procedure was aided by applying                         abdominal pressure. The patient tolerated the                         procedure well. The quality of the bowel preparation                         was  evaluated using the BBPS Res[employer] Bowel Preparation                         Scale) with scores of: Right Colon = 3, Transverse                         Colon = 3 and Left Colon = 3 (entire mucosa seen well                         with no residual staining, small fragments of stool or                         opaque liquid). The total BBPS score equals 9. The                         ileocecal valve, appendiceal orifice, and rectum were                         photographed. Findings:      Skin tags were found on perianal exam.      A 3 mm polyp was found in the ascending colon. The polyp was sessile.       The polyp was removed with a cold snare. Resection and retrieval were       complete. Estimated blood loss: none.      A 20 mm polyp was found in the ascending colon. The polyp was       carpet-like and flat. Preparations were made for mucosal resection.       Demarcation of the lesion was performed with narrow band imaging to       clearly identify the boundaries of the lesion. Eleview was injected to       raise the lesion. Snare mucosal resection was performed. Resection and  retrieval were complete. Resected tissue including tissue margins will       be examined by histology. To prevent bleeding after mucosal resection,       three hemostatic clips were successfully placed (MR safe). Clip       manufacturer: [employer]. There was no bleeding during, or at the       end, of the procedure. Estimated blood loss: none.      Four sessile polyps were found in the transverse colon. The polyps were       3 to 8 mm in size. These polyps were removed with a cold snare.       Resection and retrieval were complete. Estimated blood loss was minimal.       To prevent bleeding after the polypectomy, one hemostatic clip was       successfully placed (MR safe). Clip manufacturer: [employer].       There was no bleeding at the end of the procedure.      Two sessile polyps were found in  the sigmoid colon and descending colon.       The polyps were 4 to 5 mm in size. These polyps were removed with a cold       snare. Resection and retrieval were complete. Estimated blood loss: none.      The retroflexed view of the distal rectum and anal verge was normal and       showed no anal or rectal abnormalities. Impression:            - Perianal skin tags found on perianal exam.                        - One 3 mm polyp in the ascending colon, removed with                         a cold snare. Resected and retrieved.                        - One 20 mm polyp in the ascending colon, removed with                         mucosal resection. Resected and retrieved. Clip                         manufacturer: [employer]. Clips (MR safe) were                         placed.                        - Four 3 to 8 mm polyps in the transverse colon,                         removed with a cold snare. Resected and retrieved.                         Clip manufacturer: [employer]. Clip (MR safe)                         was placed.                        -  Two 4 to 5 mm polyps in the sigmoid colon and in the                         descending colon, removed with a cold snare. Resected                         and retrieved.                        - The distal rectum and anal verge are normal on                         retroflexion view.                        - Mucosal resection was performed. Resection and                         retrieval were complete. Recommendation:        - Discharge patient to home (with escort).                        - Resume previous diet today.                        - Continue present medications.                        - Await pathology results.                        - Repeat colonoscopy in 3 years for surveillance of                         multiple polyps. Procedure Code(s):     --- Professional ---                        [phone], Colonoscopy, flexible;  with endoscopic mucosal                         resection                        45385, 59, Colonoscopy, flexible; with removal of                         tumor(s), polyp(s), or other lesion(s) by snare                         technique Diagnosis Code(s):     --- Professional ---                        Z12.11, Encounter for screening for malignant neoplasm                         of colon                        D12.2, Benign neoplasm of ascending colon  D12.5, Benign neoplasm of sigmoid colon                        D12.3, Benign neoplasm of transverse colon (hepatic                         flexure or splenic flexure)                        D12.4, Benign neoplasm of descending colon                        K64.4, Residual hemorrhoidal skin tags CPT copyright 2022 American Medical Association. All rights reserved. The codes documented in this report are preliminary and upon coder review may  be revised to meet current compliance requirements. Dr. Libby Maw Toney Reil MD, MD 08/07/2023 10:12:34 AM This report has been signed electronically. Number of Addenda: 0 Note Initiated On: 08/07/2023 9:13 AM Scope Withdrawal Time: 0 hours 30 minutes 53 seconds  Total Procedure Duration: 0 hours 36 minutes 39 seconds  Estimated Blood Loss:  Estimated blood loss: none.      [facility]

## 2023-08-07 NOTE — Transfer of Care (Signed)
Immediate Anesthesia Transfer of Care Note  Patient: Dylan Lee  Procedure(s) Performed: COLONOSCOPY WITH PROPOFOL POLYPECTOMY HEMOSTASIS CLIP PLACEMENT HOT HEMOSTASIS (ARGON PLASMA COAGULATION/BICAP)  Patient Location: Endoscopy Unit  Anesthesia Type:General  Level of Consciousness: drowsy  Airway & Oxygen Therapy: Patient Spontanous Breathing  Post-op Assessment: Report given to RN  Post vital signs: stable  Last Vitals:  Vitals Value Taken Time  BP 96/56 08/07/23 1016  Temp 36.1 C 08/07/23 1014  Pulse 59 08/07/23 1017  Resp 15 08/07/23 1017  SpO2 96 % 08/07/23 1017  Vitals shown include unfiled device data.  Last Pain:  Vitals:   08/07/23 1014  TempSrc: Temporal  PainSc: Asleep         Complications: No notable events documented.

## 2023-08-08 ENCOUNTER — Encounter: Payer: Self-pay | Admitting: Gastroenterology

## 2023-08-08 LAB — SURGICAL PATHOLOGY

## 2023-08-09 DIAGNOSIS — J449 Chronic obstructive pulmonary disease, unspecified: Secondary | ICD-10-CM | POA: Diagnosis not present

## 2023-08-12 ENCOUNTER — Encounter: Payer: Self-pay | Admitting: Family Medicine

## 2023-08-12 ENCOUNTER — Encounter: Payer: Self-pay | Admitting: Gastroenterology

## 2023-08-12 ENCOUNTER — Other Ambulatory Visit: Payer: Self-pay | Admitting: Family Medicine

## 2023-08-12 DIAGNOSIS — F32A Depression, unspecified: Secondary | ICD-10-CM

## 2023-08-12 DIAGNOSIS — L400 Psoriasis vulgaris: Secondary | ICD-10-CM | POA: Diagnosis not present

## 2023-08-12 DIAGNOSIS — Z7960 Long term (current) use of unspecified immunomodulators and immunosuppressants: Secondary | ICD-10-CM | POA: Diagnosis not present

## 2023-08-12 DIAGNOSIS — L4050 Arthropathic psoriasis, unspecified: Secondary | ICD-10-CM | POA: Diagnosis not present

## 2023-08-17 ENCOUNTER — Other Ambulatory Visit: Payer: Self-pay | Admitting: Family Medicine

## 2023-08-17 DIAGNOSIS — J301 Allergic rhinitis due to pollen: Secondary | ICD-10-CM

## 2023-08-18 ENCOUNTER — Other Ambulatory Visit: Payer: Self-pay | Admitting: Family Medicine

## 2023-08-18 DIAGNOSIS — E785 Hyperlipidemia, unspecified: Secondary | ICD-10-CM

## 2023-09-03 DIAGNOSIS — Z95810 Presence of automatic (implantable) cardiac defibrillator: Secondary | ICD-10-CM | POA: Diagnosis not present

## 2023-09-03 DIAGNOSIS — I480 Paroxysmal atrial fibrillation: Secondary | ICD-10-CM | POA: Diagnosis not present

## 2023-09-03 DIAGNOSIS — I5022 Chronic systolic (congestive) heart failure: Secondary | ICD-10-CM | POA: Diagnosis not present

## 2023-09-03 DIAGNOSIS — I428 Other cardiomyopathies: Secondary | ICD-10-CM | POA: Diagnosis not present

## 2023-09-08 DIAGNOSIS — J449 Chronic obstructive pulmonary disease, unspecified: Secondary | ICD-10-CM | POA: Diagnosis not present

## 2023-09-27 ENCOUNTER — Ambulatory Visit: Payer: Medicare HMO | Admitting: Family Medicine

## 2023-09-30 ENCOUNTER — Ambulatory Visit
Admission: RE | Admit: 2023-09-30 | Discharge: 2023-09-30 | Disposition: A | Discharge status: Home / Self Care | Payer: Medicare Other | Source: Ambulatory Visit | Attending: Family Medicine | Admitting: Family Medicine

## 2023-09-30 ENCOUNTER — Ambulatory Visit (INDEPENDENT_AMBULATORY_CARE_PROVIDER_SITE_OTHER): Payer: Medicare Other | Admitting: Family Medicine

## 2023-09-30 VITALS — BP 106/78 | HR 74 | Resp 20 | Ht 70.0 in | Wt 206.0 lb

## 2023-09-30 DIAGNOSIS — M79601 Pain in right arm: Secondary | ICD-10-CM | POA: Diagnosis not present

## 2023-09-30 DIAGNOSIS — R109 Unspecified abdominal pain: Secondary | ICD-10-CM | POA: Diagnosis not present

## 2023-09-30 DIAGNOSIS — R079 Chest pain, unspecified: Secondary | ICD-10-CM | POA: Diagnosis not present

## 2023-09-30 DIAGNOSIS — R0789 Other chest pain: Secondary | ICD-10-CM | POA: Diagnosis not present

## 2023-09-30 DIAGNOSIS — R0781 Pleurodynia: Secondary | ICD-10-CM | POA: Diagnosis not present

## 2023-09-30 NOTE — Patient Instructions (Signed)
 Please review the attached list of medications and notify my office if there are any errors.   Go to [facility]) [facility] at [facility] for your Xrays (phone no. [phone])

## 2023-10-05 NOTE — Progress Notes (Signed)
      Established patient visit   Patient: Dylan Lee   DOB: 1956-07-14   68 y.o. Male  MRN: [id number] Visit Date: 09/30/2023  Today's healthcare provider: Mila Merry, MD   Chief Complaint  Patient presents with   Arm Pain    Right side, began about one week ago, worsening   Flank Pain    Right side towards midline, began about one week ago, worsening   Subjective    Arm Pain   Flank Pain Pertinent negatives include no abdominal pain or fever.   Patient reports he woke up with pain in his right upper chest and arm going down the right side of his chest and flank about a week ago. No known injury.   Medications: Outpatient Medications Prior to Visit  Medication Sig   albuterol (VENTOLIN HFA) 108 (90 Base) MCG/ACT inhaler Inhale 2 puffs into the lungs every 6 (six) hours as needed for wheezing or shortness of breath.   amiodarone (PACERONE) 200 MG tablet Take 200 mg by mouth daily.   aspirin 81 MG tablet Take 81 mg by mouth daily.   ipratropium (ATROVENT HFA) 17 MCG/ACT inhaler Inhale 2 puffs into the lungs every 8 (eight) hours.   JARDIANCE 10 MG TABS tablet Take 10 mg by mouth daily.   montelukast (SINGULAIR) 10 MG tablet TAKE 1 TABLET BY MOUTH AT BEDTIME. NEED APPT FOR REFILL   omeprazole (PRILOSEC) 20 MG capsule Take 20 mg by mouth daily.   rosuvastatin (CRESTOR) 40 MG tablet Take 1 tablet by mouth once daily   sacubitril-valsartan (ENTRESTO) 97-103 MG Take 1 tablet by mouth 2 (two) times daily.   spironolactone (ALDACTONE) 25 MG tablet Take 12.5 mg by mouth daily. Half tablet   torsemide (DEMADEX) 20 MG tablet Take 40 mg by mouth daily.   venlafaxine XR (EFFEXOR-XR) 150 MG 24 hr capsule TAKE 1 CAPSULE BY MOUTH ONCE DAILY TAKE  WITH  75MG   DAILY   No facility-administered medications prior to visit.    Review of Systems  Constitutional:  Negative for appetite change, chills and fever.  Respiratory:  Negative for chest tightness, shortness of breath and  wheezing.   Cardiovascular:  Negative for palpitations.  Gastrointestinal:  Negative for abdominal pain, nausea and vomiting.  Genitourinary:  Positive for flank pain.       Objective    BP 106/78   Pulse 74   Resp 20   Ht 5\' 10"  (1.778 m)   Wt 206 lb (93.4 kg)   SpO2 100%   BMI 29.56 kg/m    Physical Exam  Tender over right anterior and lateral ribs reproducing pain upon palpation of these areas. FROM of arm and shoulder. No masses. No gross deformities   Assessment & Plan     1. Flank pain (Primary)  - DG Ribs Unilateral Right; Future - DG Chest 2 View; Future  2. Rib pain on right side  - DG Ribs Unilateral Right; Future - DG Chest 2 View; Future         Mila Merry, MD  [facility] [phone] (phone) [phone] (fax)  [facility]

## 2023-10-09 DIAGNOSIS — J449 Chronic obstructive pulmonary disease, unspecified: Secondary | ICD-10-CM | POA: Diagnosis not present

## 2023-10-15 ENCOUNTER — Ambulatory Visit (INDEPENDENT_AMBULATORY_CARE_PROVIDER_SITE_OTHER): Payer: Medicare Other

## 2023-10-15 DIAGNOSIS — Z0000 Encounter for general adult medical examination without abnormal findings: Secondary | ICD-10-CM | POA: Diagnosis not present

## 2023-10-15 NOTE — Progress Notes (Signed)
Subjective:   Dylan Lee is a 68 y.o. male who presents for Medicare Annual/Subsequent preventive examination.  Visit Complete: Virtual I connected with  Dylan Lee on 10/15/23 by a audio enabled telemedicine application and verified that I am speaking with the correct person using two identifiers.  Interactive audio and video telecommunications were attempted between this provider and patient, however failed, due to patient having technical difficulties OR patient did not have access to video capability.  We continued and completed visit with audio only.   Patient Location: Home  Provider Location: Office/Clinic  I discussed the limitations of evaluation and management by telemedicine. The patient expressed understanding and agreed to proceed.  Vital Signs: Because this visit was a virtual/telehealth visit, some criteria may be missing or patient reported. Any vitals not documented were not able to be obtained and vitals that have been documented are patient reported.  Cardiac Risk Factors include: advanced age (>10men, >9 women);dyslipidemia;hypertension;male gender;sedentary lifestyle     Objective:    There were no vitals filed for this visit. There is no height or weight on file to calculate BMI.     10/15/2023    1:17 PM 08/07/2023    8:21 AM 10/10/2022    1:29 PM 10/09/2021    1:55 PM 09/24/2021    9:26 PM 09/08/2021    6:26 PM 09/07/2021    4:57 PM  Advanced Directives  Does Patient Have a Medical Advance Directive? No Yes Yes No No  No  Type of Special educational needs teacher of [place];Living will Healthcare Power of [place];Living will      Does patient want to make changes to medical advance directive?   Yes (ED - Information included in AVS)      Copy of Healthcare Power of Attorney in Chart?   No - copy requested      Would patient like information on creating a medical advance directive? No - Patient declined   No - Patient declined No - Patient  declined No - Patient declined     Current Medications (verified) Outpatient Encounter Medications as of 10/15/2023  Medication Sig   albuterol (VENTOLIN HFA) 108 (90 Base) MCG/ACT inhaler Inhale 2 puffs into the lungs every 6 (six) hours as needed for wheezing or shortness of breath.   amiodarone (PACERONE) 200 MG tablet Take 200 mg by mouth daily.   aspirin 81 MG tablet Take 81 mg by mouth daily.   ipratropium (ATROVENT HFA) 17 MCG/ACT inhaler Inhale 2 puffs into the lungs every 8 (eight) hours.   JARDIANCE 10 MG TABS tablet Take 10 mg by mouth daily.   montelukast (SINGULAIR) 10 MG tablet TAKE 1 TABLET BY MOUTH AT BEDTIME. NEED APPT FOR REFILL   omeprazole (PRILOSEC) 20 MG capsule Take 20 mg by mouth daily.   rosuvastatin (CRESTOR) 40 MG tablet Take 1 tablet by mouth once daily   sacubitril-valsartan (ENTRESTO) 97-103 MG Take 1 tablet by mouth 2 (two) times daily.   spironolactone (ALDACTONE) 25 MG tablet Take 12.5 mg by mouth daily. Half tablet   torsemide (DEMADEX) 20 MG tablet Take 40 mg by mouth daily.   venlafaxine XR (EFFEXOR-XR) 150 MG 24 hr capsule TAKE 1 CAPSULE BY MOUTH ONCE DAILY TAKE  WITH  75MG   DAILY   No facility-administered encounter medications on file as of 10/15/2023.    Allergies (verified) Quinolones   History: Past Medical History:  Diagnosis Date   CHF (congestive heart failure) (HCC)    Encephalopathy  due to COVID-19 virus 09/07/2021   Hepatitis C    Hep C treated    History of chicken pox    History of measles    History of mumps    Hyperlipidemia    Hypertension    Kidney stones    Pneumonia due to COVID-19 virus 09/07/2021   Shingles 09/06/2015   Past Surgical History:  Procedure Laterality Date   BUNIONECTOMY     CARDIAC CATHETERIZATION N/A 03/02/2015   Procedure: Right and Left Heart Cath;  Surgeon: Alwyn Pea, MD;  Location: [facility] INVASIVE CV LAB;  Service: Cardiovascular;  Laterality: N/A;   COLONOSCOPY WITH PROPOFOL N/A 08/07/2023    Procedure: COLONOSCOPY WITH PROPOFOL;  Surgeon: Toney Reil, MD;  Location: [facility] ENDOSCOPY;  Service: Gastroenterology;  Laterality: N/A;   DENTAL SURGERY     FOOT SURGERY Right    HEMOSTASIS CLIP PLACEMENT  08/07/2023   Procedure: HEMOSTASIS CLIP PLACEMENT;  Surgeon: Toney Reil, MD;  Location: [facility] ENDOSCOPY;  Service: Gastroenterology;;   HOT HEMOSTASIS  08/07/2023   Procedure: HOT HEMOSTASIS (ARGON PLASMA COAGULATION/BICAP);  Surgeon: Toney Reil, MD;  Location: [facility] ENDOSCOPY;  Service: Gastroenterology;;   IMPLANTABLE CARDIOVERTER DEFIBRILLATOR (ICD) GENERATOR CHANGE Right 06/03/2015   Procedure: ICD generator insertion ;  Surgeon: Sharion Settler, MD;  Location: [facility] ORS;  Service: Cardiovascular;  Laterality: Right;   LITHOTRIPSY     POLYPECTOMY  08/07/2023   Procedure: POLYPECTOMY;  Surgeon: Toney Reil, MD;  Location: [facility] ENDOSCOPY;  Service: Gastroenterology;;   sleep study  07/2010   severe OSA, snoring. Bilevel pressure at 19/14. Jiengel. ENT consult/ Jiengel: no ENT surgical indication for OSA   SPIROMETRY  10/20/2014   Severe obstruction   Family History  Problem Relation Age of Onset   Hypertension Mother    Bipolar disorder Mother    Ovarian cancer Mother    Heart disease Mother    Diabetes Father    Alcohol abuse Father    CAD Father    Depression Father    Alcohol abuse Sister    Congestive Heart Failure Brother    Alcohol abuse Brother    Liver cancer Brother    Social History   Socioeconomic History   Marital status: Married    Spouse name: Not on file   Number of children: 0   Years of education: Not on file   Highest education level: 12th grade  Occupational History   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 30+ years ago  Vaping Use   Vaping status: Never Used  Substance and Sexual Activity   Alcohol use: No    Alcohol/week: 0.0 standard drinks of alcohol   Drug use: Yes   Sexual activity: Not on file  Other Topics Concern   Not on file  Social History Narrative   Not on file   Social Drivers of Health   Financial Resource Strain: Low Risk  (10/15/2023)   Overall Financial Resource Strain (CARDIA)    Difficulty of Paying Living Expenses: Not hard at all  Food Insecurity: No Food Insecurity (10/15/2023)   Hunger Vital Sign    Worried  About Running Out of Food in the Last Year: Never true    Ran Out of Food in the Last Year: Never true  Transportation Needs: No Transportation Needs (10/15/2023)   PRAPARE - Administrator, Civil Service (Medical): No    Lack of Transportation (Non-Medical): No  Recent Concern: Transportation Needs - Unmet Transportation Needs (09/30/2023)   PRAPARE - Administrator, Civil Service (Medical): Yes    Lack of Transportation (Non-Medical): No  Physical Activity: Inactive (10/15/2023)   Exercise Vital Sign    Days of Exercise per Week: 0 days    Minutes of Exercise per Session: 0 min  Stress: No Stress Concern Present (10/15/2023)   [employer] of Occupational Health - Occupational Stress Questionnaire    Feeling of Stress : Not at all  Social Connections: Moderately Integrated (10/15/2023)   Social Connection and Isolation Panel [NHANES]    Frequency of Communication with Friends and Family: Three times a week    Frequency of Social Gatherings with Friends and Family: Twice a week    Attends Religious Services: Never    Database administrator or Organizations: Yes    Attends Banker Meetings: Never    Marital Status: Married    Tobacco Counseling Ready to quit: Not Answered Counseling given: Not Answered Tobacco  comments: smokes 1-2 cigars daily; quit cigs. 30+ years ago   Clinical Intake:  Pre-visit preparation completed: Yes  Pain : No/denies pain     BMI - recorded: 29.6 Nutritional Status: BMI 25 -29 Overweight Nutritional Risks: None Diabetes: No  How often do you need to have someone help you when you read instructions, pamphlets, or other written materials from your doctor or pharmacy?: 1 - Never  Interpreter Needed?: No  Information entered by :: Kennedy Bucker, LPN   Activities of Daily Living    10/15/2023    1:19 PM 01/08/2023   11:11 AM  In your present state of health, do you have any difficulty performing the following activities:  Hearing? 0 1  Vision? 0 1  Difficulty concentrating or making decisions? 1 1  Walking or climbing stairs? 1 1  Dressing or bathing? 0 0  Doing errands, shopping? 0 0  Preparing Food and eating ? N   Using the Toilet? N   In the past six months, have you accidently leaked urine? N   Do you have problems with loss of bowel control? N   Managing your Medications? Y   Managing your Finances? Y   Housekeeping or managing your Housekeeping? Y     Patient Care Team: [name], MD as PCP - General (Family Medicine) Scot Jun, MD (Inactive) (Gastroenterology) Alwyn Pea, MD as Consulting Physician (Cardiology) Dayna Barker, MD as Referring Physician (Internal Medicine) Verne Carrow, OD (Optometry) Mady Haagensen, MD (Nephrology) Patterson Hammersmith, MD as Consulting Physician (Rheumatology) Verne Carrow, OD (Optometry)  Indicate any recent Medical Services you may have received from other than [facility] providers in the past year (date may be approximate).     Assessment:   This is a routine wellness examination for Dylan Lee.  Hearing/Vision screen Hearing Screening - Comments:: NO AIDS Vision Screening - Comments:: WEARS GLASSES ALL THE TIME- DR.[email or web address]   Goals Addressed             This Visit's Progress     DIET - INCREASE WATER INTAKE         Depression Screen  10/15/2023    1:15 PM 01/08/2023   11:11 AM 10/10/2022    1:11 PM 10/09/2021    1:52 PM 10/06/2021    1:56 PM 04/03/2021    1:22 PM 02/17/2021   11:25 AM  PHQ 2/9 Scores  PHQ - 2 Score 2 2 0 0 3 2 2   PHQ- 9 Score 3 11  8 11 6 6     Fall Risk    10/15/2023    1:18 PM 01/08/2023   11:11 AM 10/10/2022    1:03 PM 10/09/2021    1:56 PM 04/03/2021    1:21 PM  Fall Risk   Falls in the past year? 0 0 1 1 0  Number falls in past yr: 0 0 1 1 0  Injury with Fall? 0 0 0 1 0  Risk for fall due to : No Fall Risks No Fall Risks Impaired mobility;Impaired balance/gait;Orthopedic patient History of fall(s) No Fall Risks  Follow up Falls prevention discussed;Falls evaluation completed Falls evaluation completed Education provided;Falls prevention discussed Falls prevention discussed Falls evaluation completed    MEDICARE RISK AT HOME: Medicare Risk at Home Any stairs in or around the home?: Yes If so, are there any without handrails?: No Home free of loose throw rugs in walkways, pet beds, electrical cords, etc?: Yes Adequate lighting in your home to reduce risk of falls?: Yes Life alert?: No Use of a cane, walker or w/c?: Yes (CANE FOR WALKING MORE THAN 40 FEET) Grab bars in the bathroom?: No Shower chair or bench in shower?: No Elevated toilet seat or a handicapped toilet?: Yes  TIMED UP AND GO:  Was the test performed?  No    Cognitive Function:        10/15/2023    1:21 PM 10/10/2022    1:23 PM  6CIT Screen  What Year? 0 points 0 points  What month? 0 points 0 points  What time? 0 points 0 points  Count back from 20 0 points 0 points  Months in reverse 0 points 0 points  Repeat phrase 2 points 2 points  Total Score 2 points 2 points    Immunizations Immunization History  Administered Date(s) Administered   Fluad Quad(high Dose 65+) 09/13/2021   Fluad Trivalent(High Dose 65+) 07/10/2023   Influenza Inj Mdck Quad Pf  07/31/2022   Influenza Split 06/24/2009, 06/22/2010   Influenza,inj,Quad PF,6+ Mos 05/28/2018, 06/12/2019   Influenza-Unspecified 07/19/2017   Pneumococcal Conjugate-13 09/12/2020   Pneumococcal Polysaccharide-23 06/24/2009, 05/07/2022   Rsv, Bivalent, Protein Subunit Rsvpref,pf Verdis Frederickson) 08/17/2022    TDAP status: Due, Education has been provided regarding the importance of this vaccine. Advised may receive this vaccine at local pharmacy or Health Dept. Aware to provide a copy of the vaccination record if obtained from local pharmacy or Health Dept. Verbalized acceptance and understanding.  Flu Vaccine status: Up to date  Pneumococcal vaccine status: Up to date  Covid-19 vaccine status: Declined, Education has been provided regarding the importance of this vaccine but patient still declined. Advised may receive this vaccine at local pharmacy or Health Dept.or vaccine clinic. Aware to provide a copy of the vaccination record if obtained from local pharmacy or Health Dept. Verbalized acceptance and understanding.  Qualifies for Shingles Vaccine? Yes   Zostavax completed No   Shingrix Completed?: No.    Education has been provided regarding the importance of this vaccine. Patient has been advised to call insurance company to determine out of pocket expense if they have not yet received this  vaccine. Advised may also receive vaccine at local pharmacy or Health Dept. Verbalized acceptance and understanding.  Screening Tests Health Maintenance  Topic Date Due   COVID-19 Vaccine (1) Never done   DTaP/Tdap/Td (1 - Tdap) Never done   Zoster Vaccines- Shingrix (1 of 2) Never done   Lung Cancer Screening  03/04/2024   Medicare Annual Wellness (AWV)  10/14/2024   Colonoscopy  08/06/2026   Pneumonia Vaccine 2+ Years old  Completed   INFLUENZA VACCINE  Completed   Hepatitis C Screening  Completed   HPV VACCINES  Aged Out    Health Maintenance  Health Maintenance Due  Topic Date Due    COVID-19 Vaccine (1) Never done   DTaP/Tdap/Td (1 - Tdap) Never done   Zoster Vaccines- Shingrix (1 of 2) Never done    Colorectal cancer screening: Type of screening: Colonoscopy. Completed 08/07/23. Repeat every 3 years  Lung Cancer Screening: (Low Dose CT Chest recommended if Age 68-80 years, 20 pack-year currently smoking OR have quit w/in 15years.) does qualify.   Lung Cancer Screening Referral: CT SCAN 11/13/23   Additional Screening:  Hepatitis C Screening: does qualify; Completed 07/10/23  Vision Screening: Recommended annual ophthalmology exams for early detection of glaucoma and other disorders of the eye. Is the patient up to date with their annual eye exam?  Yes  Who is the provider or what is the name of the office in which the patient attends annual eye exams? DR.SHADE If pt is not established with a provider, would they like to be referred to a provider to establish care? No .   Dental Screening: Recommended annual dental exams for proper oral hygiene   Community Resource Referral / Chronic Care Management: CRR required this visit?  No   CCM required this visit?  No     Plan:     I have personally reviewed and noted the following in the patient's chart:   Medical and social history Use of alcohol, tobacco or illicit drugs  Current medications and supplements including opioid prescriptions. Patient is not currently taking opioid prescriptions. Functional ability and status Nutritional status Physical activity Advanced directives List of other physicians Hospitalizations, surgeries, and ER visits in previous 12 months Vitals Screenings to include cognitive, depression, and falls Referrals and appointments  In addition, I have reviewed and discussed with patient certain preventive protocols, quality metrics, and best practice recommendations. A written personalized care plan for preventive services as well as general preventive health recommendations were  provided to patient.     Hal Hope, LPN   [id number]/[id number]   After Visit Summary: (MyChart) Due to this being a telephonic visit, the after visit summary with patients personalized plan was offered to patient via MyChart   Nurse Notes: NONE

## 2023-10-15 NOTE — Patient Instructions (Addendum)
Dylan Lee , Thank you for taking time to come for your Medicare Wellness Visit. I appreciate your ongoing commitment to your health goals. Please review the following plan we discussed and let me know if I can assist you in the future.   Referrals/Orders/Follow-Ups/Clinician Recommendations: NONE  This is a list of the screening recommended for you and due dates:  Health Maintenance  Topic Date Due   COVID-19 Vaccine (1) Never done   DTaP/Tdap/Td vaccine (1 - Tdap) Never done   Zoster (Shingles) Vaccine (1 of 2) Never done   Screening for Lung Cancer  03/04/2024   Medicare Annual Wellness Visit  10/14/2024   Colon Cancer Screening  08/06/2026   Pneumonia Vaccine  Completed   Flu Shot  Completed   Hepatitis C Screening  Completed   HPV Vaccine  Aged Out    Advanced directives: (ACP Link)Information on Advanced Care Planning can be found at [facility] Advance Health Care Directives Advance Health Care Directives ([email or web address]   Next Medicare Annual Wellness Visit scheduled for next year: Yes   10/20/24 @ 10:50 AM BY PHONE

## 2023-10-16 ENCOUNTER — Other Ambulatory Visit: Payer: Self-pay | Admitting: Family Medicine

## 2023-10-16 MED ORDER — DAPAGLIFLOZIN PROPANEDIOL 10 MG PO TABS: 10.0000 mg | ORAL_TABLET | Freq: Every day | ORAL | 3 refills | Status: DC

## 2023-11-09 DIAGNOSIS — J449 Chronic obstructive pulmonary disease, unspecified: Secondary | ICD-10-CM | POA: Diagnosis not present

## 2023-11-12 ENCOUNTER — Other Ambulatory Visit: Payer: Self-pay | Admitting: Family Medicine

## 2023-11-12 DIAGNOSIS — F32A Depression, unspecified: Secondary | ICD-10-CM

## 2023-11-13 ENCOUNTER — Ambulatory Visit
Admission: RE | Admit: 2023-11-13 | Discharge: 2023-11-13 | Disposition: A | Discharge status: Home / Self Care | Payer: Medicare Other | Source: Ambulatory Visit | Attending: Internal Medicine | Admitting: Internal Medicine

## 2023-11-13 DIAGNOSIS — I700 Atherosclerosis of aorta: Secondary | ICD-10-CM | POA: Diagnosis not present

## 2023-11-13 DIAGNOSIS — I2510 Atherosclerotic heart disease of native coronary artery without angina pectoris: Secondary | ICD-10-CM | POA: Diagnosis not present

## 2023-11-13 DIAGNOSIS — I714 Abdominal aortic aneurysm, without rupture, unspecified: Secondary | ICD-10-CM | POA: Diagnosis not present

## 2023-11-13 DIAGNOSIS — J439 Emphysema, unspecified: Secondary | ICD-10-CM | POA: Diagnosis not present

## 2023-11-13 DIAGNOSIS — I7121 Aneurysm of the ascending aorta, without rupture: Secondary | ICD-10-CM | POA: Diagnosis not present

## 2023-11-13 LAB — POCT I-STAT CREATININE: Creatinine, Ser: 2.1 mg/dL — ABNORMAL HIGH (ref 0.61–1.24)

## 2023-11-13 MED ORDER — IOHEXOL 350 MG/ML SOLN
75.0000 mL | Freq: Once | INTRAVENOUS | Status: AC | PRN
  Administered 2023-11-13: 60 mL via INTRAVENOUS

## 2023-11-17 ENCOUNTER — Other Ambulatory Visit: Payer: Self-pay | Admitting: Family Medicine

## 2023-11-17 DIAGNOSIS — E785 Hyperlipidemia, unspecified: Secondary | ICD-10-CM

## 2023-11-18 ENCOUNTER — Other Ambulatory Visit: Payer: Self-pay | Admitting: Family Medicine

## 2023-11-18 DIAGNOSIS — J301 Allergic rhinitis due to pollen: Secondary | ICD-10-CM

## 2023-11-18 DIAGNOSIS — I7121 Aneurysm of the ascending aorta, without rupture: Secondary | ICD-10-CM | POA: Diagnosis not present

## 2023-11-19 NOTE — Telephone Encounter (Signed)
 Requested Prescriptions  Pending Prescriptions Disp Refills   montelukast (SINGULAIR) 10 MG tablet [Pharmacy Med Name: Montelukast Sodium 10 MG Oral Tablet] 90 tablet 0    Sig: TAKE 1 TABLET BY MOUTH AT BEDTIME . APPOINTMENT REQUIRED FOR FUTURE REFILLS     Pulmonology:  Leukotriene Inhibitors Passed - 11/19/2023  1:01 PM      Passed - Valid encounter within last 12 months    Recent Outpatient Visits           1 month ago Flank pain   [facility] [name], MD   4 months ago Pharyngeal dysphagia   [facility] [name], MD   10 months ago Pre-diabetes   [facility] [name], MD   1 year ago Stage 3a chronic kidney disease [facility])   [facility] [name], MD   1 year ago Annual physical exam   [facility] [name], MD       Future Appointments             In 1 month Fisher, Demetrios Isaacs, MD [facility]

## 2023-11-26 DIAGNOSIS — I5022 Chronic systolic (congestive) heart failure: Secondary | ICD-10-CM | POA: Diagnosis not present

## 2023-11-28 DIAGNOSIS — I7121 Aneurysm of the ascending aorta, without rupture: Secondary | ICD-10-CM | POA: Diagnosis not present

## 2023-11-28 DIAGNOSIS — G4733 Obstructive sleep apnea (adult) (pediatric): Secondary | ICD-10-CM | POA: Diagnosis not present

## 2023-11-28 DIAGNOSIS — J449 Chronic obstructive pulmonary disease, unspecified: Secondary | ICD-10-CM | POA: Diagnosis not present

## 2023-11-28 DIAGNOSIS — F17211 Nicotine dependence, cigarettes, in remission: Secondary | ICD-10-CM | POA: Diagnosis not present

## 2023-11-28 DIAGNOSIS — Z9981 Dependence on supplemental oxygen: Secondary | ICD-10-CM | POA: Diagnosis not present

## 2023-12-07 DIAGNOSIS — J449 Chronic obstructive pulmonary disease, unspecified: Secondary | ICD-10-CM | POA: Diagnosis not present

## 2023-12-11 DIAGNOSIS — I5022 Chronic systolic (congestive) heart failure: Secondary | ICD-10-CM | POA: Diagnosis not present

## 2023-12-11 DIAGNOSIS — N2581 Secondary hyperparathyroidism of renal origin: Secondary | ICD-10-CM | POA: Diagnosis not present

## 2023-12-11 DIAGNOSIS — N1832 Chronic kidney disease, stage 3b: Secondary | ICD-10-CM | POA: Diagnosis not present

## 2023-12-11 DIAGNOSIS — R809 Proteinuria, unspecified: Secondary | ICD-10-CM | POA: Diagnosis not present

## 2023-12-19 DIAGNOSIS — I10 Essential (primary) hypertension: Secondary | ICD-10-CM | POA: Diagnosis not present

## 2023-12-19 DIAGNOSIS — I480 Paroxysmal atrial fibrillation: Secondary | ICD-10-CM | POA: Diagnosis not present

## 2023-12-19 DIAGNOSIS — I714 Abdominal aortic aneurysm, without rupture, unspecified: Secondary | ICD-10-CM | POA: Diagnosis not present

## 2023-12-19 DIAGNOSIS — I2510 Atherosclerotic heart disease of native coronary artery without angina pectoris: Secondary | ICD-10-CM | POA: Diagnosis not present

## 2023-12-19 DIAGNOSIS — G4733 Obstructive sleep apnea (adult) (pediatric): Secondary | ICD-10-CM | POA: Diagnosis not present

## 2023-12-19 DIAGNOSIS — J449 Chronic obstructive pulmonary disease, unspecified: Secondary | ICD-10-CM | POA: Diagnosis not present

## 2023-12-19 DIAGNOSIS — E782 Mixed hyperlipidemia: Secondary | ICD-10-CM | POA: Diagnosis not present

## 2023-12-19 DIAGNOSIS — N1831 Chronic kidney disease, stage 3a: Secondary | ICD-10-CM | POA: Diagnosis not present

## 2023-12-19 DIAGNOSIS — I5022 Chronic systolic (congestive) heart failure: Secondary | ICD-10-CM | POA: Diagnosis not present

## 2023-12-19 DIAGNOSIS — I471 Supraventricular tachycardia, unspecified: Secondary | ICD-10-CM | POA: Diagnosis not present

## 2023-12-19 DIAGNOSIS — I428 Other cardiomyopathies: Secondary | ICD-10-CM | POA: Diagnosis not present

## 2023-12-19 DIAGNOSIS — Z95810 Presence of automatic (implantable) cardiac defibrillator: Secondary | ICD-10-CM | POA: Diagnosis not present

## 2024-01-07 DIAGNOSIS — J449 Chronic obstructive pulmonary disease, unspecified: Secondary | ICD-10-CM | POA: Diagnosis not present

## 2024-01-08 ENCOUNTER — Encounter: Payer: Self-pay | Admitting: Family Medicine

## 2024-01-08 ENCOUNTER — Ambulatory Visit (INDEPENDENT_AMBULATORY_CARE_PROVIDER_SITE_OTHER): Payer: Self-pay | Admitting: Family Medicine

## 2024-01-08 VITALS — BP 117/72 | HR 77 | Resp 16 | Ht 70.0 in | Wt 211.1 lb

## 2024-01-08 DIAGNOSIS — I5022 Chronic systolic (congestive) heart failure: Secondary | ICD-10-CM

## 2024-01-08 DIAGNOSIS — I2510 Atherosclerotic heart disease of native coronary artery without angina pectoris: Secondary | ICD-10-CM

## 2024-01-08 DIAGNOSIS — G4733 Obstructive sleep apnea (adult) (pediatric): Secondary | ICD-10-CM | POA: Diagnosis not present

## 2024-01-08 DIAGNOSIS — R198 Other specified symptoms and signs involving the digestive system and abdomen: Secondary | ICD-10-CM | POA: Insufficient documentation

## 2024-01-08 DIAGNOSIS — I10 Essential (primary) hypertension: Secondary | ICD-10-CM | POA: Diagnosis not present

## 2024-01-08 DIAGNOSIS — I7121 Aneurysm of the ascending aorta, without rupture: Secondary | ICD-10-CM

## 2024-01-08 DIAGNOSIS — N1832 Chronic kidney disease, stage 3b: Secondary | ICD-10-CM

## 2024-01-08 DIAGNOSIS — J449 Chronic obstructive pulmonary disease, unspecified: Secondary | ICD-10-CM

## 2024-01-08 DIAGNOSIS — L4050 Arthropathic psoriasis, unspecified: Secondary | ICD-10-CM | POA: Diagnosis not present

## 2024-01-08 DIAGNOSIS — Z95811 Presence of heart assist device: Secondary | ICD-10-CM | POA: Insufficient documentation

## 2024-01-08 MED ORDER — DIPHENOXYLATE-ATROPINE 2.5-0.025 MG PO TABS: 2.0000 | ORAL_TABLET | Freq: Two times a day (BID) | ORAL | 1 refills | Status: AC | PRN

## 2024-01-08 NOTE — Progress Notes (Signed)
 Established patient visit   Patient: Dylan Lee   DOB: 1956/05/08   68 y.o. Male  MRN: [id number] Visit Date: 01/08/2024  Today's healthcare provider: Jeralene Mom, MD   Chief Complaint  Patient presents with   Hypertension    6 month f/u  wants to discuss Cpap, havent had a solid BM since colonoscopy    Subjective    Hypertension Pertinent negatives include no chest pain, palpitations or shortness of breath.   F/u htn, chf, COPD, ckd. Continue regular follow up CHF clnic and with Dr. Beau Bound, Dr Rhesa Celeste, and Dr Jamal Mays for COPD. Quit smoking in November. Rarely needs inhaler. Also needs new CPAP machine as his current is well over 31 years old and getting error message that not functioning correctly. He e-mailed Dr. Jamal Mays about it, but hasn't heard anything back yet.   Getting CTA for ascending AA per Dr. Beau Bound.   Also states been having intermittent episodes of diarrhea since having colonoscopy in November. No pain or blood in stool.   Lab Results  Component Value Date   CHOL 130 07/10/2023   HDL 31 (L) 07/10/2023   LDLCALC 70 07/10/2023   TRIG 172 (H) 07/10/2023   CHOLHDL 4.2 07/10/2023     Medications: Outpatient Medications Prior to Visit  Medication Sig   albuterol  (VENTOLIN  HFA) 108 (90 Base) MCG/ACT inhaler Inhale 2 puffs into the lungs every 6 (six) hours as needed for wheezing or shortness of breath.   amiodarone (PACERONE) 200 MG tablet Take 200 mg by mouth daily.   aspirin  81 MG tablet Take 81 mg by mouth daily.   dapagliflozin  propanediol (FARXIGA ) 10 MG TABS tablet Take 1 tablet (10 mg total) by mouth daily before breakfast.   ipratropium (ATROVENT  HFA) 17 MCG/ACT inhaler Inhale 2 puffs into the lungs every 8 (eight) hours.   JARDIANCE 10 MG TABS tablet Take 10 mg by mouth daily. (NOT TAKING)   montelukast  (SINGULAIR ) 10 MG tablet TAKE 1 TABLET BY MOUTH AT BEDTIME . APPOINTMENT REQUIRED FOR FUTURE REFILLS   omeprazole (PRILOSEC) 20 MG  capsule Take 20 mg by mouth daily.   rosuvastatin  (CRESTOR ) 40 MG tablet Take 1 tablet by mouth once daily   sacubitril -valsartan  (ENTRESTO ) 97-103 MG Take 1 tablet by mouth 2 (two) times daily.   spironolactone  (ALDACTONE ) 25 MG tablet Take 12.5 mg by mouth daily. Half tablet   torsemide  (DEMADEX ) 20 MG tablet Take 40 mg by mouth daily.   venlafaxine  XR (EFFEXOR -XR) 150 MG 24 hr capsule TAKE 1 CAPSULE BY MOUTH ONCE DAILY WITH  75MG   DAILY   No facility-administered medications prior to visit.    Review of Systems  Constitutional:  Negative for appetite change, chills and fever.  Respiratory:  Negative for chest tightness, shortness of breath and wheezing.   Cardiovascular:  Negative for chest pain and palpitations.  Gastrointestinal:  Negative for abdominal pain, nausea and vomiting.       Objective    BP 117/72 (BP Location: Right Arm, Patient Position: Sitting, Cuff Size: Normal)   Pulse 77   Resp 16   Ht 5\' 10"  (1.778 m)   Wt 211 lb 1.6 oz (95.8 kg)   SpO2 98%   BMI 30.29 kg/m    Physical Exam   General: Appearance:    Mildly obese male in no acute distress  Eyes:    PERRL, conjunctiva/corneas clear, EOM's intact       Lungs:     Clear to auscultation  bilaterally, respirations unlabored  Heart:    Normal heart rate. Normal rhythm. No murmurs, rubs, or gallops.    MS:   All extremities are intact.    Neurologic:   Awake, alert, oriented x 3. No apparent focal neurological defect.         Assessment & Plan     1. Primary hypertension (Primary) Well controlled.  Continue current medications.    2. Change in bowel function Onset after otherwise normal colonoscopy 5 months ago.   Recommend start using OTC probiotics, or try daily yogurt.   Can also use prn diphenoxylate -atropine  (LOMOTIL ) 2.5-0.025 MG tablet; Take 2 tablets by mouth 2 (two) times daily as needed for diarrhea or loose stools.  Dispense: 20 tablet; Refill: 1  3. Aneurysm of ascending aorta without  rupture (HCC) UTD on follow up screenings per Dr. Beau Bound.   4. Coronary artery disease involving native coronary artery of native heart without angina pectoris Asymptomatic. Compliant with medication.  Continue aggressive risk factor modification.    5. Chronic systolic heart failure (HCC) Well compensated. Continue regular follow up CHF clinic.   6. Presence of heart assist device (HCC) Cardioverted in place  7. Psoriatic arthritis (HCC) Stable, continue regular follow up Dr. Lydia Sams.   8. Chronic obstructive pulmonary disease, unspecified COPD type (HCC) Improved since he stopped smoking. Continue regular follow up Dr. Jamal Mays.   9. Chronic kidney disease, stage 3b (HCC) Stable. Continue regular follow up Dr. Rhesa Celeste.   10. Obstructive sleep apnea syndrome Current CPAP machine ordered in 2011 and in need of replacement. He put in call to his pulmonologist, but we may need to order from here.    Return in about 6 months (around 07/09/2024) for Hypertension.         Jeralene Mom, MD  [facility] [phone] (phone) [phone] (fax)  [facility]

## 2024-01-08 NOTE — Patient Instructions (Addendum)
 Please review the attached list of medications and notify my office if there are any errors.   Try an over the counter Probiotic such as Align, or 1-2 cups of Yogurt to reset your gastrointestinal system. You can take the prescription Imodium occasionally as needed.

## 2024-01-31 ENCOUNTER — Inpatient Hospital Stay
Admission: EM | Admit: 2024-01-31 | Discharge: 2024-02-02 | DRG: 309 | Disposition: A | Discharge status: Home / Self Care | Attending: Osteopathic Medicine | Admitting: Osteopathic Medicine

## 2024-01-31 ENCOUNTER — Emergency Department

## 2024-01-31 ENCOUNTER — Other Ambulatory Visit: Payer: Self-pay

## 2024-01-31 DIAGNOSIS — Z8249 Family history of ischemic heart disease and other diseases of the circulatory system: Secondary | ICD-10-CM

## 2024-01-31 DIAGNOSIS — K219 Gastro-esophageal reflux disease without esophagitis: Secondary | ICD-10-CM | POA: Diagnosis present

## 2024-01-31 DIAGNOSIS — E66811 Obesity, class 1: Secondary | ICD-10-CM | POA: Diagnosis present

## 2024-01-31 DIAGNOSIS — R0689 Other abnormalities of breathing: Secondary | ICD-10-CM | POA: Diagnosis not present

## 2024-01-31 DIAGNOSIS — J449 Chronic obstructive pulmonary disease, unspecified: Secondary | ICD-10-CM | POA: Diagnosis not present

## 2024-01-31 DIAGNOSIS — I4901 Ventricular fibrillation: Secondary | ICD-10-CM | POA: Diagnosis not present

## 2024-01-31 DIAGNOSIS — Z95810 Presence of automatic (implantable) cardiac defibrillator: Secondary | ICD-10-CM

## 2024-01-31 DIAGNOSIS — I428 Other cardiomyopathies: Secondary | ICD-10-CM | POA: Diagnosis not present

## 2024-01-31 DIAGNOSIS — I5022 Chronic systolic (congestive) heart failure: Secondary | ICD-10-CM | POA: Diagnosis not present

## 2024-01-31 DIAGNOSIS — I25110 Atherosclerotic heart disease of native coronary artery with unstable angina pectoris: Secondary | ICD-10-CM | POA: Diagnosis present

## 2024-01-31 DIAGNOSIS — Z7982 Long term (current) use of aspirin: Secondary | ICD-10-CM | POA: Diagnosis not present

## 2024-01-31 DIAGNOSIS — Z888 Allergy status to other drugs, medicaments and biological substances status: Secondary | ICD-10-CM

## 2024-01-31 DIAGNOSIS — R6889 Other general symptoms and signs: Secondary | ICD-10-CM | POA: Diagnosis not present

## 2024-01-31 DIAGNOSIS — F32A Depression, unspecified: Secondary | ICD-10-CM | POA: Diagnosis present

## 2024-01-31 DIAGNOSIS — Z6830 Body mass index (BMI) 30.0-30.9, adult: Secondary | ICD-10-CM

## 2024-01-31 DIAGNOSIS — J9611 Chronic respiratory failure with hypoxia: Secondary | ICD-10-CM | POA: Diagnosis not present

## 2024-01-31 DIAGNOSIS — I509 Heart failure, unspecified: Secondary | ICD-10-CM | POA: Diagnosis not present

## 2024-01-31 DIAGNOSIS — N1831 Chronic kidney disease, stage 3a: Secondary | ICD-10-CM | POA: Diagnosis present

## 2024-01-31 DIAGNOSIS — F419 Anxiety disorder, unspecified: Secondary | ICD-10-CM | POA: Diagnosis present

## 2024-01-31 DIAGNOSIS — E785 Hyperlipidemia, unspecified: Secondary | ICD-10-CM | POA: Diagnosis present

## 2024-01-31 DIAGNOSIS — E1122 Type 2 diabetes mellitus with diabetic chronic kidney disease: Secondary | ICD-10-CM | POA: Diagnosis not present

## 2024-01-31 DIAGNOSIS — I130 Hypertensive heart and chronic kidney disease with heart failure and stage 1 through stage 4 chronic kidney disease, or unspecified chronic kidney disease: Secondary | ICD-10-CM | POA: Diagnosis not present

## 2024-01-31 DIAGNOSIS — E119 Type 2 diabetes mellitus without complications: Secondary | ICD-10-CM

## 2024-01-31 DIAGNOSIS — R008 Other abnormalities of heart beat: Secondary | ICD-10-CM | POA: Diagnosis not present

## 2024-01-31 DIAGNOSIS — R079 Chest pain, unspecified: Secondary | ICD-10-CM | POA: Diagnosis not present

## 2024-01-31 DIAGNOSIS — Z833 Family history of diabetes mellitus: Secondary | ICD-10-CM

## 2024-01-31 DIAGNOSIS — I480 Paroxysmal atrial fibrillation: Secondary | ICD-10-CM | POA: Diagnosis not present

## 2024-01-31 DIAGNOSIS — I472 Ventricular tachycardia, unspecified: Secondary | ICD-10-CM | POA: Diagnosis not present

## 2024-01-31 DIAGNOSIS — Z7984 Long term (current) use of oral hypoglycemic drugs: Secondary | ICD-10-CM

## 2024-01-31 DIAGNOSIS — R0789 Other chest pain: Secondary | ICD-10-CM | POA: Diagnosis not present

## 2024-01-31 DIAGNOSIS — R072 Precordial pain: Principal | ICD-10-CM

## 2024-01-31 DIAGNOSIS — B1920 Unspecified viral hepatitis C without hepatic coma: Secondary | ICD-10-CM | POA: Diagnosis present

## 2024-01-31 DIAGNOSIS — I110 Hypertensive heart disease with heart failure: Secondary | ICD-10-CM | POA: Diagnosis not present

## 2024-01-31 DIAGNOSIS — G4733 Obstructive sleep apnea (adult) (pediatric): Secondary | ICD-10-CM | POA: Diagnosis present

## 2024-01-31 DIAGNOSIS — Z8616 Personal history of COVID-19: Secondary | ICD-10-CM | POA: Diagnosis not present

## 2024-01-31 DIAGNOSIS — Z79899 Other long term (current) drug therapy: Secondary | ICD-10-CM | POA: Diagnosis not present

## 2024-01-31 DIAGNOSIS — F17290 Nicotine dependence, other tobacco product, uncomplicated: Secondary | ICD-10-CM | POA: Diagnosis present

## 2024-01-31 DIAGNOSIS — I771 Stricture of artery: Secondary | ICD-10-CM | POA: Diagnosis not present

## 2024-01-31 LAB — COMPREHENSIVE METABOLIC PANEL WITH GFR
ALT: 13 U/L (ref 0–44)
AST: 16 U/L (ref 15–41)
Albumin: 3.8 g/dL (ref 3.5–5.0)
Alkaline Phosphatase: 44 U/L (ref 38–126)
Anion gap: 10 (ref 5–15)
BUN: 24 mg/dL — ABNORMAL HIGH (ref 8–23)
CO2: 24 mmol/L (ref 22–32)
Calcium: 8.4 mg/dL — ABNORMAL LOW (ref 8.9–10.3)
Chloride: 103 mmol/L (ref 98–111)
Creatinine, Ser: 2.06 mg/dL — ABNORMAL HIGH (ref 0.61–1.24)
GFR, Estimated: 34 mL/min — ABNORMAL LOW (ref >60)
Glucose, Bld: 104 mg/dL — ABNORMAL HIGH (ref 70–99)
Potassium: 4 mmol/L (ref 3.5–5.1)
Sodium: 137 mmol/L (ref 135–145)
Total Bilirubin: 0.7 mg/dL (ref 0.0–1.2)
Total Protein: 6.8 g/dL (ref 6.5–8.1)

## 2024-01-31 LAB — CBC WITH DIFFERENTIAL/PLATELET
Abs Immature Granulocytes: 0.04 10*3/uL (ref 0.00–0.07)
Basophils Absolute: 0.1 10*3/uL (ref 0.0–0.1)
Basophils Relative: 1 %
Eosinophils Absolute: 0.3 10*3/uL (ref 0.0–0.5)
Eosinophils Relative: 3 %
HCT: 46.4 % (ref 39.0–52.0)
Hemoglobin: 15 g/dL (ref 13.0–17.0)
Immature Granulocytes: 0 %
Lymphocytes Relative: 14 %
Lymphs Abs: 1.3 10*3/uL (ref 0.7–4.0)
MCH: 30.7 pg (ref 26.0–34.0)
MCHC: 32.3 g/dL (ref 30.0–36.0)
MCV: 95.1 fL (ref 80.0–100.0)
Monocytes Absolute: 0.9 10*3/uL (ref 0.1–1.0)
Monocytes Relative: 9 %
Neutro Abs: 7.1 10*3/uL (ref 1.7–7.7)
Neutrophils Relative %: 73 %
Platelets: 268 10*3/uL (ref 150–400)
RBC: 4.88 MIL/uL (ref 4.22–5.81)
RDW: 14.6 % (ref 11.5–15.5)
WBC: 9.7 10*3/uL (ref 4.0–10.5)
nRBC: 0 % (ref 0.0–0.2)

## 2024-01-31 LAB — MAGNESIUM: Magnesium: 2.3 mg/dL (ref 1.7–2.4)

## 2024-01-31 LAB — BRAIN NATRIURETIC PEPTIDE: B Natriuretic Peptide: 124.3 pg/mL — ABNORMAL HIGH (ref 0.0–100.0)

## 2024-01-31 LAB — PROTIME-INR
INR: 1.1 (ref 0.8–1.2)
Prothrombin Time: 14 s (ref 11.4–15.2)

## 2024-01-31 LAB — TSH: TSH: 3.477 u[IU]/mL (ref 0.350–4.500)

## 2024-01-31 LAB — APTT: aPTT: 29 s (ref 24–36)

## 2024-01-31 LAB — TROPONIN I (HIGH SENSITIVITY)
Troponin I (High Sensitivity): 43 ng/L — ABNORMAL HIGH (ref <18)
Troponin I (High Sensitivity): 49 ng/L — ABNORMAL HIGH (ref <18)

## 2024-01-31 LAB — PHOSPHORUS: Phosphorus: 3.6 mg/dL (ref 2.5–4.6)

## 2024-01-31 MED ORDER — PANTOPRAZOLE SODIUM 40 MG PO TBEC
40.0000 mg | DELAYED_RELEASE_TABLET | Freq: Every day | ORAL | Status: DC
  Administered 2024-02-01 – 2024-02-02 (×2): 40 mg via ORAL
  Filled 2024-01-31 (×2): qty 1

## 2024-01-31 MED ORDER — HEPARIN (PORCINE) 25000 UT/250ML-% IV SOLN
1250.0000 [IU]/h | INTRAVENOUS | Status: DC
  Administered 2024-01-31: 1200 [IU]/h via INTRAVENOUS
  Filled 2024-01-31 (×3): qty 250

## 2024-01-31 MED ORDER — METOPROLOL TARTRATE 25 MG PO TABS
25.0000 mg | ORAL_TABLET | Freq: Once | ORAL | Status: AC
  Administered 2024-01-31: 25 mg via ORAL
  Filled 2024-01-31: qty 1

## 2024-01-31 MED ORDER — BUDESON-GLYCOPYRROL-FORMOTEROL 160-9-4.8 MCG/ACT IN AERO
2.0000 | INHALATION_SPRAY | Freq: Two times a day (BID) | RESPIRATORY_TRACT | Status: DC
  Filled 2024-01-31 (×2): qty 5.9

## 2024-01-31 MED ORDER — ALBUTEROL SULFATE (2.5 MG/3ML) 0.083% IN NEBU: 2.5000 mg | INHALATION_SOLUTION | Freq: Four times a day (QID) | RESPIRATORY_TRACT | Status: DC | PRN

## 2024-01-31 MED ORDER — IPRATROPIUM BROMIDE HFA 17 MCG/ACT IN AERS: 2.0000 | INHALATION_SPRAY | Freq: Three times a day (TID) | RESPIRATORY_TRACT | Status: DC

## 2024-01-31 MED ORDER — HEPARIN BOLUS VIA INFUSION
4000.0000 [IU] | Freq: Once | INTRAVENOUS | Status: AC
  Administered 2024-01-31: 4000 [IU] via INTRAVENOUS
  Filled 2024-01-31: qty 4000

## 2024-01-31 MED ORDER — SODIUM CHLORIDE 0.9 % IV BOLUS
500.0000 mL | Freq: Once | INTRAVENOUS | Status: AC
  Administered 2024-01-31: 500 mL via INTRAVENOUS

## 2024-01-31 MED ORDER — NITROGLYCERIN IN D5W 200-5 MCG/ML-% IV SOLN: 0.0000 ug/min | INTRAVENOUS | Status: DC

## 2024-01-31 MED ORDER — ASPIRIN 81 MG PO TBEC
81.0000 mg | DELAYED_RELEASE_TABLET | Freq: Every day | ORAL | Status: DC
  Administered 2024-02-01 – 2024-02-02 (×2): 81 mg via ORAL
  Filled 2024-01-31 (×2): qty 1

## 2024-01-31 MED ORDER — IPRATROPIUM BROMIDE 0.02 % IN SOLN: 0.5000 mg | Freq: Three times a day (TID) | RESPIRATORY_TRACT | Status: DC

## 2024-01-31 MED ORDER — ROSUVASTATIN CALCIUM 10 MG PO TABS
40.0000 mg | ORAL_TABLET | Freq: Every day | ORAL | Status: DC
  Administered 2024-02-01 – 2024-02-02 (×2): 40 mg via ORAL
  Filled 2024-01-31 (×2): qty 2
  Filled 2024-01-31: qty 4

## 2024-01-31 MED ORDER — AMIODARONE HCL 200 MG PO TABS
200.0000 mg | ORAL_TABLET | Freq: Every day | ORAL | Status: DC
  Administered 2024-02-01 – 2024-02-02 (×2): 200 mg via ORAL
  Filled 2024-01-31 (×2): qty 1

## 2024-01-31 MED ORDER — MONTELUKAST SODIUM 10 MG PO TABS
10.0000 mg | ORAL_TABLET | Freq: Every day | ORAL | Status: DC
  Administered 2024-01-31 – 2024-02-01 (×2): 10 mg via ORAL
  Filled 2024-01-31 (×2): qty 1

## 2024-01-31 NOTE — ED Triage Notes (Signed)
 Pt BIB [facility] from home c/o  centralized chest pain. Pts wife took b/p at home and it was 76/32 with HR 40. EMS reports pt had VTACH with pulse between 150-160 but self converted once on truck. Hx COPD, CHF, Internal defibrillator.  324mg  Aspirin   2 Nitroglycerin  113/80 91 HR

## 2024-01-31 NOTE — ED Notes (Signed)
 This RN spoke to [place], MD in postponing starting the Nitroglycerin  drip due to pt not having chest pain and having systolic below 100.

## 2024-01-31 NOTE — H&P (Signed)
 HISTORY AND PHYSICAL    Dylan Lee   ZOX:[id number] DOB: 18-May-1956   Date of Service: 01/31/24 Requesting physician/APP from ED: Treatment Team:  Attending Provider: Jacquie Maudlin, MD  PCP: Lamon Pillow, MD   HPI: Dylan Lee is a 68 y.o. male w/ PMH CAD, HFrEF, nonischemic cardiomyopathy, Paroxysmal Afib, Hx SVT, AICD in place, AAA, COPD, OSA on CPAP, HLD, HTN, CKD 3a, former tobacco user, GERD, Hep C.   Per EDP, 01/31/24 today developed crushing chest pain at rest at home. EMS called, noted to be in VTach, BP 85/50. Uncertain if defibrillator activated. Pt received fluids, converted to sinus rhythm. EMS administered nitroglycerin but this dropped BP. EDP spoke w/ cardiology on-call, recommendation for heparin  gtt, nitroglycerin gtt if BP will tolerate. Pt pain essentially resolved, noting 1/10 dull pain. Hospitalist called for admission  In ED, troponin 30s, repeat pending at time of admission. Cr 2.06 and GFR 34 slightly worse from baseline but recently check at at Cr 1.99 and GFR 36 outpatient. CXR nonacute. EKG sinus. No other concerns on CBC, CMP, TSH. Tx: heparin  drip, nitroglycerin drip    Consultants:  Cardiology - [facility]   Procedures/Surgeries: none      ASSESSMENT & PLAN:   Ventricular fibrillation Unstable Angina History of CAD, HFrEF, nonischemic cardiomyopathy, Paroxysmal Afib, Hx SVT, AAA, HLD, HTN, AICD in place Home cardiac meds include: amiodarone 200 mg daily, ASA 81 mg daily, Jardiance 10 mg daily, Crestor  40 mg daily, Entresto  97-103 mg bid, spironolactone  12.5 mg daily, torsemide  40 mg daily. He is NOT on anticoagulation.  Cardiology following  Admission to progressive unit Continue ASA, Crestor , amiodarone Heparin  gtt Nitrog gtt as BP tolerates Given soft BP, will hold Entresto , Jardiance, spiro, torsemide  pending cardiology recs  NPO after midnight in case procedures  Continuous telemetry  Pt is FULL  CODE  COPD Chronic hypoxic respiratory failure  O2 via Ingram to maintain sats above 88% Substitute Breztri for home symbicort , spiriva  respimat prn albuterol   OSA on CPAP Continue CPAP qhs  CKD 3a Monitor BMP  GERD PPI     Class 1 obesity based on BMI: Body mass index is 30.78 kg/m.Dylan Lee low or high BMI is associated with higher medical risk.  Underweight - under 18  overweight - 25 to 29 obese - 30 or more Class 1 obesity: BMI of 30.0 to 34 Class 2 obesity: BMI of 35.0 to 39 Class 3 obesity: BMI of 40.0 to 49 Super Morbid Obesity: BMI 50-59 Super-super Morbid Obesity: BMI 60+ Healthy nutrition and physical activity advised as adjunct to other disease management and risk reduction treatments    DVT prophylaxis: on heparin  IV fluids: no continuous IV fluids  Nutrition: cardiac diet for now, NPO p mn in case procedures  Central lines / other devices: noen  Code Status: FULL CODE but pt states he would not want to be on life support indefinitely, if not improving after a few days would want to be removed  ACP documentation reviewed:  none on file in [facility]  TOC needs: TBD Medical barriers to dispo: cardiac monitoring and workup, cardiology clearance prior to discharge. Expected medical readiness for discharge 2-3 days.                     Review of Systems:  Review of Systems  Constitutional:  Negative for chills, fever and malaise/fatigue.  HENT:  Negative for sinus pain and sore throat.   Respiratory:  Positive for shortness  of breath. Negative for cough and sputum production.   Cardiovascular:  Positive for chest pain, palpitations, orthopnea and leg swelling (mild).  Gastrointestinal:  Negative for abdominal pain, constipation, diarrhea, heartburn, nausea and vomiting.  Musculoskeletal:  Negative for falls and myalgias.  Neurological:  Negative for dizziness, tremors and loss of consciousness.  Psychiatric/Behavioral:  Negative for  depression.        has a past medical history of CHF (congestive heart failure) (HCC), Encephalopathy due to COVID-19 virus (09/07/2021), Hepatitis C, History of chicken pox, History of measles, History of mumps, Hyperlipidemia, Hypertension, Kidney stones, Pneumonia due to COVID-19 virus (09/07/2021), and Shingles (09/06/2015).  No current facility-administered medications on file prior to encounter.   Current Outpatient Medications on File Prior to Encounter  Medication Sig Dispense Refill   albuterol  (VENTOLIN  HFA) 108 (90 Base) MCG/ACT inhaler Inhale 2 puffs into the lungs every 6 (six) hours as needed for wheezing or shortness of breath.     amiodarone (PACERONE) 200 MG tablet Take 200 mg by mouth daily.     aspirin  81 MG tablet Take 81 mg by mouth daily.     dapagliflozin  propanediol (FARXIGA ) 10 MG TABS tablet Take 1 tablet (10 mg total) by mouth daily before breakfast. 90 tablet 3   diphenoxylate -atropine  (LOMOTIL ) 2.5-0.025 MG tablet Take 2 tablets by mouth 2 (two) times daily as needed for diarrhea or loose stools. 20 tablet 1   ipratropium (ATROVENT  HFA) 17 MCG/ACT inhaler Inhale 2 puffs into the lungs every 8 (eight) hours. 1 each 12   JARDIANCE 10 MG TABS tablet Take 10 mg by mouth daily. (Patient not taking: Reported on 01/08/2024)     montelukast  (SINGULAIR ) 10 MG tablet TAKE 1 TABLET BY MOUTH AT BEDTIME . APPOINTMENT REQUIRED FOR FUTURE REFILLS 90 tablet 0   omeprazole (PRILOSEC) 20 MG capsule Take 20 mg by mouth daily.     rosuvastatin  (CRESTOR ) 40 MG tablet Take 1 tablet by mouth once daily 90 tablet 3   sacubitril -valsartan  (ENTRESTO ) 97-103 MG Take 1 tablet by mouth 2 (two) times daily.     spironolactone  (ALDACTONE ) 25 MG tablet Take 12.5 mg by mouth daily. Half tablet     torsemide  (DEMADEX ) 20 MG tablet Take 40 mg by mouth daily.     venlafaxine  XR (EFFEXOR -XR) 150 MG 24 hr capsule TAKE 1 CAPSULE BY MOUTH ONCE DAILY WITH  75MG   DAILY 90 capsule 0     Allergies   Allergen Reactions   Quinolones Other (See Comments)    Fluroquinolone antibiotics should be avoided in patients with aortic disease unless alternative therapy is not an option.      family history includes Alcohol abuse in his brother, father, and sister; Bipolar disorder in his mother; CAD in his father; Congestive Heart Failure in his brother; Depression in his father; Diabetes in his father; Heart disease in his mother; Hypertension in his mother; Liver cancer in his brother; Ovarian cancer in his mother. Past Surgical History:  Procedure Laterality Date   BUNIONECTOMY     CARDIAC CATHETERIZATION N/A 03/02/2015   Procedure: Right and Left Heart Cath;  Surgeon: Antonette Batters, MD;  Location: [facility] INVASIVE CV LAB;  Service: Cardiovascular;  Laterality: N/A;   COLONOSCOPY WITH PROPOFOL  N/A 08/07/2023   Procedure: COLONOSCOPY WITH PROPOFOL ;  Surgeon: Selena Daily, MD;  Location: [facility] ENDOSCOPY;  Service: Gastroenterology;  Laterality: N/A;   DENTAL SURGERY     FOOT SURGERY Right    HEMOSTASIS CLIP PLACEMENT  08/07/2023  Procedure: HEMOSTASIS CLIP PLACEMENT;  Surgeon: Selena Daily, MD;  Location: [facility] ENDOSCOPY;  Service: Gastroenterology;;   HOT HEMOSTASIS  08/07/2023   Procedure: HOT HEMOSTASIS (ARGON PLASMA COAGULATION/BICAP);  Surgeon: Selena Daily, MD;  Location: [facility] ENDOSCOPY;  Service: Gastroenterology;;   IMPLANTABLE CARDIOVERTER DEFIBRILLATOR (ICD) GENERATOR CHANGE Right 06/03/2015   Procedure: ICD generator insertion ;  Surgeon: Jerrie Morales, MD;  Location: [facility] ORS;  Service: Cardiovascular;  Laterality: Right;   LITHOTRIPSY     POLYPECTOMY  08/07/2023   Procedure: POLYPECTOMY;  Surgeon: Selena Daily, MD;  Location: [facility] ENDOSCOPY;  Service: Gastroenterology;;   sleep study  07/2010   severe OSA, snoring. Bilevel pressure at 19/14. Jiengel. ENT consult/ Jiengel: no ENT surgical indication for OSA   SPIROMETRY  10/20/2014   Severe obstruction           Objective Findings:  Vitals:   01/31/24 1559 01/31/24 1602 01/31/24 1700  BP: 90/63  109/67  Pulse: 89  75  Resp: 18  19  Temp: 98.7 F (37.1 C)    TempSrc: Oral    SpO2: 94%  98%  Weight:  97.3 kg 97.3 kg  Height:   5\' 10"  (1.778 m)   No intake or output data in the 24 hours ending 01/31/24 1726 Filed Weights   01/31/24 1602 01/31/24 1700  Weight: 97.3 kg 97.3 kg    Examination:  Physical Exam Constitutional:      General: He is not in acute distress.    Appearance: He is not ill-appearing.  Cardiovascular:     Rate and Rhythm: Normal rate and regular rhythm.  Pulmonary:     Effort: Pulmonary effort is normal.     Breath sounds: Decreased breath sounds (mild diffusely) present.  Abdominal:     General: Bowel sounds are normal.     Palpations: Abdomen is soft.  Musculoskeletal:     Right lower leg: No edema.     Left lower leg: No edema.  Skin:    General: Skin is warm and dry.  Neurological:     General: No focal deficit present.     Mental Status: He is alert and oriented to person, place, and time.  Psychiatric:        Mood and Affect: Mood normal.        Behavior: Behavior normal.          Scheduled Medications:   metoprolol  tartrate  25 mg Oral Once    Continuous Infusions:  heparin  1,200 Units/hr (01/31/24 1725)   nitroGLYCERIN      PRN Medications:    Antimicrobials:  Anti-infectives (From admission, onward)    None           Data Reviewed: I have personally reviewed following labs and imaging studies  CBC: Recent Labs  Lab 01/31/24 1607  WBC 9.7  NEUTROABS 7.1  HGB 15.0  HCT 46.4  MCV 95.1  PLT 268   Basic Metabolic Panel: Recent Labs  Lab 01/31/24 1607  NA 137  K 4.0  CL 103  CO2 24  GLUCOSE 104*  BUN 24*  CREATININE 2.06*  CALCIUM  8.4*  MG 2.3  PHOS 3.6   GFR: Estimated Creatinine Clearance: 40.1 mL/min (A) (by C-G formula based on SCr of 2.06 mg/dL (H)). Liver Function Tests: Recent  Labs  Lab 01/31/24 1607  AST 16  ALT 13  ALKPHOS 44  BILITOT 0.7  PROT 6.8  ALBUMIN  3.8   No results for input(s): "LIPASE", "AMYLASE" in  the last 168 hours. No results for input(s): "AMMONIA" in the last 168 hours. Coagulation Profile: Recent Labs  Lab 01/31/24 1608  INR 1.1   Cardiac Enzymes: No results for input(s): "CKTOTAL", "CKMB", "CKMBINDEX", "TROPONINI" in the last 168 hours. BNP (last 3 results) No results for input(s): "PROBNP" in the last 8760 hours. HbA1C: No results for input(s): "HGBA1C" in the last 72 hours. CBG: No results for input(s): "GLUCAP" in the last 168 hours. Lipid Profile: No results for input(s): "CHOL", "HDL", "LDLCALC", "TRIG", "CHOLHDL", "LDLDIRECT" in the last 72 hours. Thyroid  Function Tests: Recent Labs    01/31/24 1607  TSH 3.477   Anemia Panel: No results for input(s): "VITAMINB12", "FOLATE", "FERRITIN", "TIBC", "IRON", "RETICCTPCT" in the last 72 hours. Most Recent Urinalysis On File:     Component Value Date/Time   COLORURINE STRAW (A) 05/25/2015 1014   APPEARANCEUR CLEAR (A) 05/25/2015 1014   LABSPEC 1.009 05/25/2015 1014   PHURINE 6.0 05/25/2015 1014   GLUCOSEU NEGATIVE 05/25/2015 1014   HGBUR NEGATIVE 05/25/2015 1014   BILIRUBINUR negative 04/03/2021 1343   KETONESUR NEGATIVE 05/25/2015 1014   PROTEINUR Negative 04/03/2021 1343   PROTEINUR NEGATIVE 05/25/2015 1014   UROBILINOGEN 0.2 04/03/2021 1343   NITRITE negative 04/03/2021 1343   NITRITE NEGATIVE 05/25/2015 1014   LEUKOCYTESUR Negative 04/03/2021 1343   Sepsis Labs: @LABRCNTIP (procalcitonin:4,lacticidven:4)  No results found for this or any previous visit (from the past 240 hours).       Radiology Studies: DG Chest Portable 1 View Result Date: 01/31/2024 CLINICAL DATA:  Central chest pain.  Chest CTA dated 11/13/2023. EXAM: PORTABLE CHEST 1 VIEW COMPARISON:  09/30/2023 and chest CTA dated 11/13/2023. FINDINGS: Interval mildly enlarged cardiac silhouette.  Tortuous aorta. Clear lungs with normal vascularity. The lungs remain mildly hyperexpanded. Mild thoracic spine degenerative changes. Stable right subclavian AICD lead with its tip at the right ventricular apex. IMPRESSION: 1. Interval mild cardiomegaly. 2. No acute abnormality. 3. Mild COPD. Electronically Signed   By: Catherin Closs M.D.   On: 01/31/2024 16:45             LOS: 0 days        Melodi Sprung, DO [facility] 01/31/2024, 5:26 PM    Dictation software may have been used to generate the above note. Typos may occur and escape review in typed/dictated notes. Please contact Dr Authur Leghorn directly for clarity if needed.  Staff may message me via secure chat in Epic  but this may not receive an immediate response,  please page me for urgent matters!  If 7PM-7AM, please contact night coverage [email or web address]

## 2024-01-31 NOTE — ED Provider Notes (Signed)
 [facility] Provider Note    Event Date/Time   First MD Initiated Contact with Patient 01/31/24 1552     (approximate)   History   Chief Complaint: Chest Pain   HPI  Dylan Lee is a 68 y.o. male with a history of hypertension diabetes CHF with AICD implanted who comes to the ED by EMS due to crushing chest pain that started about 3:00 PM today while patient was sitting in a chair.  Radiated to his arm, associated with diaphoresis shortness of breath and near syncope.  He did not feel any shocks from his AICD.  EMS came and initial rhythm showed ventricular tachycardia.        Past Medical History:  Diagnosis Date   CHF (congestive heart failure) (HCC)    Encephalopathy due to COVID-19 virus 09/07/2021   Hepatitis C    Hep C treated    History of chicken pox    History of measles    History of mumps    Hyperlipidemia    Hypertension    Kidney stones    Pneumonia due to COVID-19 virus 09/07/2021   Shingles 09/06/2015    Current Outpatient Rx   Order #: [id number] Class: Historical Med   Order #: [id number] Class: Historical Med   Order #: [id number] Class: Historical Med   Order #: [id number] Class: Normal   Order #: [id number] Class: Normal   Order #: [id number] Class: Normal   Order #: [id number] Class: Historical Med   Order #: [id number] Class: Normal   Order #: [id number] Class: Historical Med   Order #: [id number] Class: Normal   Order #: [id number] Class: Historical Med   Order #: [id number] Class: Historical Med   Order #: [id number] Class: Historical Med   Order #: [id number] Class: Normal    Past Surgical History:  Procedure Laterality Date   BUNIONECTOMY     CARDIAC CATHETERIZATION N/A 03/02/2015   Procedure: Right and Left Heart Cath;  Surgeon: Antonette Batters, MD;  Location: [facility] INVASIVE CV LAB;  Service: Cardiovascular;  Laterality: N/A;   COLONOSCOPY WITH PROPOFOL  N/A 08/07/2023   Procedure: COLONOSCOPY WITH PROPOFOL ;  Surgeon: Selena Daily, MD;  Location: [facility] ENDOSCOPY;  Service: Gastroenterology;  Laterality: N/A;   DENTAL SURGERY     FOOT SURGERY Right    HEMOSTASIS CLIP PLACEMENT  08/07/2023   Procedure: HEMOSTASIS CLIP PLACEMENT;  Surgeon: Selena Daily, MD;  Location: [facility] ENDOSCOPY;  Service: Gastroenterology;;   HOT HEMOSTASIS  08/07/2023   Procedure: HOT HEMOSTASIS (ARGON PLASMA COAGULATION/BICAP);  Surgeon: Selena Daily, MD;  Location: [facility] ENDOSCOPY;  Service: Gastroenterology;;   IMPLANTABLE CARDIOVERTER DEFIBRILLATOR (ICD) GENERATOR CHANGE Right 06/03/2015   Procedure: ICD generator insertion ;  Surgeon: Jerrie Morales, MD;  Location: [facility] ORS;  Service: Cardiovascular;  Laterality: Right;   LITHOTRIPSY     POLYPECTOMY  08/07/2023   Procedure: POLYPECTOMY;  Surgeon: Selena Daily, MD;  Location: [facility] ENDOSCOPY;  Service: Gastroenterology;;   sleep study  07/2010   severe OSA, snoring. Bilevel pressure at 19/14. Jiengel. ENT consult/ Jiengel: no ENT surgical indication for OSA   SPIROMETRY  10/20/2014   Severe obstruction    Physical Exam   Triage Vital Signs: ED Triage Vitals  Encounter Vitals Group     BP 01/31/24 1559 90/63     Systolic BP Percentile --      Diastolic BP Percentile --      Pulse Rate 01/31/24 1559 89     Resp 01/31/24 1559 18     Temp 01/31/24  1559 98.7 F (37.1 C)     Temp Source 01/31/24 1559 Oral     SpO2 01/31/24 1559 94 %     Weight 01/31/24 1602 214 lb 6.4 oz (97.3 kg)     Height --      Head Circumference --      Peak Flow --      Pain Score 01/31/24 1602 1     Pain Loc --      Pain Education --      Exclude from Growth Chart --     Most recent vital signs: Vitals:   01/31/24 1700 01/31/24 1726  BP: 109/67 106/66  Pulse: 75 79  Resp: 19   Temp:    SpO2: 98%     General: Awake, no distress.  CV:  Good peripheral perfusion.  Regular rate and rhythm, normal distal pulses. Resp:  Normal effort.  Clear to auscultation  bilaterally Abd:  No distention.  Soft nontender Other:  No lower extremity edema   ED Results / Procedures / Treatments   Labs (all labs ordered are listed, but only abnormal results are displayed) Labs Reviewed  COMPREHENSIVE METABOLIC PANEL WITH GFR - Abnormal; Notable for the following components:      Result Value   Glucose, Bld 104 (*)    BUN 24 (*)    Creatinine, Ser 2.06 (*)    Calcium  8.4 (*)    GFR, Estimated 34 (*)    All other components within normal limits  BRAIN NATRIURETIC PEPTIDE - Abnormal; Notable for the following components:   B Natriuretic Peptide 124.3 (*)    All other components within normal limits  TROPONIN I (HIGH SENSITIVITY) - Abnormal; Notable for the following components:   Troponin I (High Sensitivity) 43 (*)    All other components within normal limits  CBC WITH DIFFERENTIAL/PLATELET  MAGNESIUM  PHOSPHORUS  TSH  APTT  PROTIME-INR  HEPARIN  LEVEL (UNFRACTIONATED)  CBC  TROPONIN I (HIGH SENSITIVITY)     EKG Interpreted by me Sinus rhythm rate of 90.  Left axis, normal intervals.  Poor R wave progression.  Normal ST segments and T waves.  No ischemic changes.  Rhythm strip obtained by EMS at 3:11 PM today shows wide-complex monomorphic tachycardia with regular RR intervals consistent with ventricular tachycardia, rate of about 150.   RADIOLOGY Chest x-ray interpreted by me, no pneumothorax or frank pulmonary edema.  Radiology report reviewed   PROCEDURES:  .Critical Care  Performed by: Jacquie Maudlin, MD Authorized by: Jacquie Maudlin, MD   Critical care provider statement:    Critical care time (minutes):  35   Critical care time was exclusive of:  Separately billable procedures and treating other patients   Critical care was necessary to treat or prevent imminent or life-threatening deterioration of the following conditions:  Shock and cardiac failure   Critical care was time spent personally by me on the following  activities:  Development of treatment plan with patient or surrogate, discussions with consultants, evaluation of patient's response to treatment, examination of patient, obtaining history from patient or surrogate, ordering and performing treatments and interventions, ordering and review of laboratory studies, ordering and review of radiographic studies, pulse oximetry, re-evaluation of patient's condition and review of old charts   Care discussed with: admitting provider      MEDICATIONS ORDERED IN ED: Medications  nitroGLYCERIN 50 mg in dextrose 5 % 250 mL (0.2 mg/mL) infusion (has no administration in time range)  heparin  ADULT infusion 100 units/mL (  25000 units/244mL) (1,200 Units/hr Intravenous New Bag/Given 01/31/24 1725)  sodium chloride  0.9 % bolus 500 mL (0 mLs Intravenous Stopped 01/31/24 1729)  metoprolol  tartrate (LOPRESSOR ) tablet 25 mg (25 mg Oral Given 01/31/24 1726)  heparin  bolus via infusion 4,000 Units (4,000 Units Intravenous Bolus from Bag 01/31/24 1725)     IMPRESSION / MDM / ASSESSMENT AND PLAN / ED COURSE  I reviewed the triage vital signs and the nursing notes.  DDx: Paroxysmal ventricular tachycardia, ICD failure, electrolyte derangement, hyperthyroidism, dehydration, anemia, AKI, NSTEMI  Patient's presentation is most consistent with acute presentation with potential threat to life or bodily function.  Patient presents with an episode of ventricular tachycardia, severe chest pain, near syncope at home.  Now back in sinus rhythm.  Blood pressure is borderline but adequate at 90/60, possibly still suppressed by nitroglycerin given by EMS.  Will give small fluid bolus, monitor blood pressure, check labs.  Will contact his [facility] cardiology team and plan for admission.  Pacer pads are on the patient, he is being continuously monitored.   Clinical Course as of 01/31/24 1741  Fri Jan 31, 2024  1638 Cardiology recs heparin , nitroglycerin drip, po metop. [PS]    Clinical  Course User Index [PS] Jacquie Maudlin, MD    ----------------------------------------- 5:40 PM on 01/31/2024 ----------------------------------------- Patient reports pain is resolved,, blood pressure improved.  Will defer on starting nitroglycerin for now unless pain returns.  Initial troponin of 43 is nonspecific though higher than previous.  Case discussed with hospitalist.   FINAL CLINICAL IMPRESSION(S) / ED DIAGNOSES   Final diagnoses:  Precordial pain  Ventricular tachycardia (HCC)  Chronic congestive heart failure, unspecified heart failure type (HCC)  Type 2 diabetes mellitus without complication, without long-term current use of insulin (HCC)     Rx / DC Orders   ED Discharge Orders     None        Note:  This document was prepared using Dragon voice recognition software and may include unintentional dictation errors.   Jacquie Maudlin, MD 01/31/24 [phone]

## 2024-01-31 NOTE — Consult Note (Signed)
 Pharmacy Consult Note - Anticoagulation  Pharmacy Consult for heparin  Indication: chest pain/ACS  PATIENT MEASUREMENTS: Height: 5\' 10"  (177.8 cm) Weight: 97.3 kg (214 lb 8.1 oz) IBW/kg (Calculated) : 73 HEPARIN  DW (KG): 93.1  VITAL SIGNS: Temp: 98.7 F (37.1 C) (05/16 1559) Temp Source: Oral (05/16 1559) BP: 90/63 (05/16 1559) Pulse Rate: 89 (05/16 1559)  Recent Labs    01/31/24 1607  HGB 15.0  HCT 46.4  PLT 268  CREATININE 2.06*  TROPONINIHS 43*    Estimated Creatinine Clearance: 40.1 mL/min (A) (by C-G formula based on SCr of 2.06 mg/dL (H)).  PAST MEDICAL HISTORY: Past Medical History:  Diagnosis Date   CHF (congestive heart failure) (HCC)    Encephalopathy due to COVID-19 virus 09/07/2021   Hepatitis C    Hep C treated    History of chicken pox    History of measles    History of mumps    Hyperlipidemia    Hypertension    Kidney stones    Pneumonia due to COVID-19 virus 09/07/2021   Shingles 09/06/2015    ASSESSMENT: 69 y.o. male with PMH including is presenting with chest pain/ACS. Mildly elevated cTn. Patient is not on chronic anticoagulation per chart review. Pharmacy has been consulted to initiate and manage heparin  intravenous infusion.  Pertinent medications: No chronic anticoagulation prior to admission,   Goal(s) of therapy: Heparin  level 0.3 - 0.7 units/mL Monitor platelets by anticoagulation protocol: Yes   Baseline anticoagulation labs: Recent Labs    01/31/24 1607  HGB 15.0  PLT 268  Baseline labs have been ordered.  Date Time aPTT/HL Rate/Comment     PLAN: Give 4000 units bolus x1; then start heparin  infusion at 1200 units/hour. Check heparin  level in 6 hours, then daily once at least two levels are consecutively therapeutic. Monitor CBC daily while on heparin  infusion.   Will M. Alva Jewels, PharmD Clinical Pharmacist 01/31/2024 5:01 PM

## 2024-02-01 ENCOUNTER — Inpatient Hospital Stay
Admit: 2024-02-01 | Discharge: 2024-02-01 | Disposition: A | Discharge status: Home / Self Care | Attending: Osteopathic Medicine

## 2024-02-01 DIAGNOSIS — I428 Other cardiomyopathies: Secondary | ICD-10-CM | POA: Diagnosis not present

## 2024-02-01 DIAGNOSIS — I472 Ventricular tachycardia, unspecified: Secondary | ICD-10-CM | POA: Diagnosis not present

## 2024-02-01 LAB — BASIC METABOLIC PANEL WITH GFR
Anion gap: 9 (ref 5–15)
BUN: 25 mg/dL — ABNORMAL HIGH (ref 8–23)
CO2: 25 mmol/L (ref 22–32)
Calcium: 8.6 mg/dL — ABNORMAL LOW (ref 8.9–10.3)
Chloride: 107 mmol/L (ref 98–111)
Creatinine, Ser: 1.94 mg/dL — ABNORMAL HIGH (ref 0.61–1.24)
GFR, Estimated: 37 mL/min — ABNORMAL LOW (ref >60)
Glucose, Bld: 106 mg/dL — ABNORMAL HIGH (ref 70–99)
Potassium: 4 mmol/L (ref 3.5–5.1)
Sodium: 141 mmol/L (ref 135–145)

## 2024-02-01 LAB — CBC
HCT: 46.7 % (ref 39.0–52.0)
Hemoglobin: 15 g/dL (ref 13.0–17.0)
MCH: 30.9 pg (ref 26.0–34.0)
MCHC: 32.1 g/dL (ref 30.0–36.0)
MCV: 96.1 fL (ref 80.0–100.0)
Platelets: 254 10*3/uL (ref 150–400)
RBC: 4.86 MIL/uL (ref 4.22–5.81)
RDW: 14.6 % (ref 11.5–15.5)
WBC: 8 10*3/uL (ref 4.0–10.5)
nRBC: 0 % (ref 0.0–0.2)

## 2024-02-01 LAB — LIPID PANEL
Cholesterol: 126 mg/dL (ref 0–200)
HDL: 36 mg/dL — ABNORMAL LOW (ref >40)
LDL Cholesterol: 69 mg/dL (ref 0–99)
Total CHOL/HDL Ratio: 3.5 ratio
Triglycerides: 106 mg/dL (ref <150)
VLDL: 21 mg/dL (ref 0–40)

## 2024-02-01 LAB — HEPARIN LEVEL (UNFRACTIONATED)
Heparin Unfractionated: 0.3 [IU]/mL (ref 0.30–0.70)
Heparin Unfractionated: 0.3 [IU]/mL (ref 0.30–0.70)

## 2024-02-01 MED ORDER — PERFLUTREN LIPID MICROSPHERE
1.0000 mL | INTRAVENOUS | Status: AC | PRN
  Administered 2024-02-01: 5 mL via INTRAVENOUS

## 2024-02-01 MED ORDER — METOPROLOL SUCCINATE ER 25 MG PO TB24
25.0000 mg | ORAL_TABLET | Freq: Every day | ORAL | Status: DC
  Administered 2024-02-02: 25 mg via ORAL
  Filled 2024-02-01 (×2): qty 1

## 2024-02-01 NOTE — Consult Note (Signed)
 Pharmacy Consult Note - Anticoagulation  Pharmacy Consult for heparin  Indication: chest pain/ACS  PATIENT MEASUREMENTS: Height: 5\' 10"  (177.8 cm) Weight: 97.3 kg (214 lb 8.1 oz) IBW/kg (Calculated) : 73 HEPARIN  DW (KG): 93.1  VITAL SIGNS: Temp: 98.4 F (36.9 C) (05/17 0100) Temp Source: Oral (05/17 0100) BP: 119/70 (05/17 0100) Pulse Rate: 57 (05/17 0100)  Recent Labs    01/31/24 1607 01/31/24 1608 01/31/24 1847 02/01/24 0110  HGB 15.0  --   --   --   HCT 46.4  --   --   --   PLT 268  --   --   --   APTT  --  29  --   --   LABPROT  --  14.0  --   --   INR  --  1.1  --   --   HEPARINUNFRC  --   --   --  0.30  CREATININE 2.06*  --   --   --   TROPONINIHS 43*  --  49*  --     Estimated Creatinine Clearance: 40.1 mL/min (A) (by C-G formula based on SCr of 2.06 mg/dL (H)).  PAST MEDICAL HISTORY: Past Medical History:  Diagnosis Date   CHF (congestive heart failure) (HCC)    Encephalopathy due to COVID-19 virus 09/07/2021   Hepatitis C    Hep C treated    History of chicken pox    History of measles    History of mumps    Hyperlipidemia    Hypertension    Kidney stones    Pneumonia due to COVID-19 virus 09/07/2021   Shingles 09/06/2015    ASSESSMENT: 68 y.o. male with PMH including is presenting with chest pain/ACS. Mildly elevated cTn. Patient is not on chronic anticoagulation per chart review. Pharmacy has been consulted to initiate and manage heparin  intravenous infusion.  Pertinent medications: No chronic anticoagulation prior to admission,   Goal(s) of therapy: Heparin  level 0.3 - 0.7 units/mL Monitor platelets by anticoagulation protocol: Yes   Baseline anticoagulation labs: Recent Labs    01/31/24 1607 01/31/24 1608  APTT  --  29  INR  --  1.1  HGB 15.0  --   PLT 268  --   Baseline labs have been ordered.  Date Time aPTT/HL Rate/Comment 05/17 0110 HL = 0.30 Therapeutic x 1    PLAN: Continue heparin  infusion at 1200 units/hour. Recheck  heparin  level in 6 hours, then daily once at least two levels are consecutively therapeutic. Monitor CBC daily while on heparin  infusion.  Coretta Dexter, PharmD, [facility] 02/01/2024 1:53 AM

## 2024-02-01 NOTE — Hospital Course (Signed)
 Hospital Course / Significant events:  HPI: Dylan Lee is a 68 y.o. male w/ PMH CAD, HFrEF, nonischemic cardiomyopathy, Paroxysmal Afib, Hx SVT, AICD in place, AAA, COPD, OSA on CPAP, HLD, HTN, CKD 3a, former tobacco user, GERD, Hep C. On 01/31/24 developed crushing chest pain at rest at home. EMS called, noted to be in VTach, BP 85/50. Uncertain if defibrillator activated. Pt received fluids, converted to sinus rhythm. EMS administered nitroglycerin  but this dropped BP.   05/16: In ED, troponin 30s, repeat pending at time of admission. Cr 2.06 and GFR 34 slightly worse from baseline but recently check at at Cr 1.99 and GFR 36 outpatient. CXR nonacute. EKG sinus. No other concerns on CBC, CMP, TSH. Tx: heparin  drip, nitroglycerin  drip EDP spoke w/ cardiology on-call, recommendation for heparin  gtt, nitroglycerin  gtt if BP will tolerate. Pt pain essentially resolved, noting 1/10 dull pain. Hospitalist called for admission 05/17: [employer] device interrogation from January-March reviewed by cardiology, they will reach out to [employer] for interrogation of 05/16 events. Planning cardiac MRI for [facility] 05/19. Continue heparin , amiodarone , added po metoprolol . No plans for cardiac cath.  05/18: unable to do MRI here d/t pacemaker though it is compatible, will need to arrange this outpatient. Per cardiology he is clear for discharge.       Consultants:  Cardiology - [facility]    Procedures/Surgeries: none           ASSESSMENT & PLAN:   Ventricular fibrillation Unstable Angina History of CAD, HFrEF, nonischemic cardiomyopathy, Paroxysmal Afib, Hx SVT, AAA, HLD, HTN, AICD in place Home cardiac meds include: amiodarone  200 mg daily, ASA 81 mg daily, Jardiance 10 mg daily, Crestor  40 mg daily, Entresto  97-103 mg bid, spironolactone  12.5 mg daily, torsemide  40 mg daily. He is NOT on anticoagulation.  Cardiology following  Admission to progressive unit Continue ASA, Crestor ,  amiodarone  Cardiology added metoprolol   Heparin  gtt Plan Cardiac MRI resume Entresto , Jardiance, spiro, torsemide  since BP improved/high Hold spironolactone  pending outpatient BP check  Pt is FULL CODE   COPD Chronic hypoxic respiratory failure  O2 via  to maintain sats above 88% Resume home inhalers/nebs   OSA on CPAP Continue CPAP qhs   CKD 3a Monitor BMP outpatient    GERD PPI  Anxiety/depression Resume home Effexor           Class 1 obesity based on BMI: Body mass index is 30.78 kg/m.Aaron Aas Significantly low or high BMI is associated with higher medical risk.  Underweight - under 18  overweight - 25 to 29 obese - 30 or more Class 1 obesity: BMI of 30.0 to 34 Class 2 obesity: BMI of 35.0 to 39 Class 3 obesity: BMI of 40.0 to 49 Super Morbid Obesity: BMI 50-59 Super-super Morbid Obesity: BMI 60+ Healthy nutrition and physical activity advised as adjunct to other disease management and risk reduction treatments       DVT prophylaxis: on heparin  IV fluids: no continuous IV fluids  Nutrition: cardiac diet  Central lines / other devices: noen   Code Status: FULL CODE but pt states he would not want to be on life support indefinitely, if not improving after a few days would want to be removed  ACP documentation reviewed:  none on file in [facility]   TOC needs: TBD Medical barriers to dispo: cardiac monitoring and workup, cardiology clearance prior to discharge. Expected medical readiness for discharge 2-3 days pending MRI which will have to wait until Monday

## 2024-02-01 NOTE — Progress Notes (Signed)
  Echocardiogram 2D Echocardiogram has been performed. Definity IV ultrasound imaging agent used on this study.  Nichola Barges 02/01/2024, 4:23 PM

## 2024-02-01 NOTE — ED Notes (Signed)
 Infexor and omperozole at night per pt wife

## 2024-02-01 NOTE — ED Notes (Signed)
 Pt ambulated to restroom w steady gate and returned without incident. Full linen change provided. CB within reach. NAD

## 2024-02-01 NOTE — Progress Notes (Signed)
 PROGRESS NOTE    [name]   ZOX:[id number] DOB: 01-14-1956  DOA: 01/31/2024 Date of Service: 02/01/24 which is hospital day 1  PCP: Lamon Pillow, MD    Hospital Course / Significant events:  HPI: Dylan Lee is a 68 y.o. male w/ PMH CAD, HFrEF, nonischemic cardiomyopathy, Paroxysmal Afib, Hx SVT, AICD in place, AAA, COPD, OSA on CPAP, HLD, HTN, CKD 3a, former tobacco user, GERD, Hep C. On 01/31/24 developed crushing chest pain at rest at home. EMS called, noted to be in VTach, BP 85/50. Uncertain if defibrillator activated. Pt received fluids, converted to sinus rhythm. EMS administered nitroglycerin  but this dropped BP.   05/16: In ED, troponin 30s, repeat pending at time of admission. Cr 2.06 and GFR 34 slightly worse from baseline but recently check at at Cr 1.99 and GFR 36 outpatient. CXR nonacute. EKG sinus. No other concerns on CBC, CMP, TSH. Tx: heparin  drip, nitroglycerin  drip EDP spoke w/ cardiology on-call, recommendation for heparin  gtt, nitroglycerin  gtt if BP will tolerate. Pt pain essentially resolved, noting 1/10 dull pain. Hospitalist called for admission 05/17: [employer] device interrogation from January-March reviewed by cardiology, they will reach out to [employer] for interrogation of 05/16 events. Planning cardiac MRI for [facility] 05/19. Continue heparin , amiodarone , added po metoprolol . No plans for cardiac cath.       Consultants:  Cardiology - [facility]    Procedures/Surgeries: none           ASSESSMENT & PLAN:   Ventricular fibrillation Unstable Angina History of CAD, HFrEF, nonischemic cardiomyopathy, Paroxysmal Afib, Hx SVT, AAA, HLD, HTN, AICD in place Home cardiac meds include: amiodarone  200 mg daily, ASA 81 mg daily, Jardiance 10 mg daily, Crestor  40 mg daily, Entresto  97-103 mg bid, spironolactone  12.5 mg daily, torsemide  40 mg daily. He is NOT on anticoagulation.  Cardiology following  Admission to progressive unit Continue ASA,  Crestor , amiodarone  Cardiology added metoprolol   Heparin  gtt Plan Cardiac MRI Given soft BP, will hold Entresto , Jardiance, spiro, torsemide  pending cardiology recs  Continuous telemetry  Pt is FULL CODE   COPD Chronic hypoxic respiratory failure  O2 via [place] to maintain sats above 88% Substitute Breztri  for home symbicort , spiriva  respimat prn albuterol    OSA on CPAP Continue CPAP qhs   CKD 3a Monitor BMP   GERD PPI         Class 1 obesity based on BMI: Body mass index is 30.78 kg/m.Aaron Aas Significantly low or high BMI is associated with higher medical risk.  Underweight - under 18  overweight - 25 to 29 obese - 30 or more Class 1 obesity: BMI of 30.0 to 34 Class 2 obesity: BMI of 35.0 to 39 Class 3 obesity: BMI of 40.0 to 49 Super Morbid Obesity: BMI 50-59 Super-super Morbid Obesity: BMI 60+ Healthy nutrition and physical activity advised as adjunct to other disease management and risk reduction treatments       DVT prophylaxis: on heparin  IV fluids: no continuous IV fluids  Nutrition: cardiac diet  Central lines / other devices: noen   Code Status: FULL CODE but pt states he would not want to be on life support indefinitely, if not improving after a few days would want to be removed  ACP documentation reviewed:  none on file in [facility]   TOC needs: TBD Medical barriers to dispo: cardiac monitoring and workup, cardiology clearance prior to discharge. Expected medical readiness for discharge 2-3 days pending MRI which will have to wait until Monday  Subjective / Brief ROS:  Patient reports doing well this morning Denies CP/SOB.  Pain controlled.  Denies new weakness.  Tolerating diet.  Reports no concerns w/ urination/defecation.   Family Communication: none at this time     Objective Findings:  Vitals:   02/01/24 0706 02/01/24 1000 02/01/24 1100 02/01/24 1301  BP:  116/68 127/80   Pulse:  65 63   Resp:  19 16   Temp:    98.4 F (36.9  C)  TempSrc:    Oral  SpO2: 95% 96% 96%   Weight:      Height:        Intake/Output Summary (Last 24 hours) at 02/01/2024 1444 Last data filed at 01/31/2024 1729 Gross per 24 hour  Intake 500 ml  Output --  Net 500 ml   Filed Weights   01/31/24 1602 01/31/24 1700  Weight: 97.3 kg 97.3 kg    Examination:  Physical Exam Constitutional:      General: He is not in acute distress.    Appearance: He is not ill-appearing.  Cardiovascular:     Rate and Rhythm: Normal rate and regular rhythm.     Heart sounds: Murmur heard.  Pulmonary:     Effort: Pulmonary effort is normal.     Breath sounds: Decreased breath sounds (mild, diffusely) present. No wheezing, rhonchi or rales.  Musculoskeletal:     Right lower leg: No edema.     Left lower leg: No edema.  Neurological:     General: No focal deficit present.     Mental Status: He is alert and oriented to person, place, and time.  Psychiatric:        Mood and Affect: Mood normal.        Behavior: Behavior normal.          Scheduled Medications:   amiodarone   200 mg Oral Daily   aspirin  EC  81 mg Oral Daily   budesonide -glycopyrrolate -formoterol   2 puff Inhalation BID   metoprolol  succinate  25 mg Oral Daily   montelukast   10 mg Oral QHS   pantoprazole   40 mg Oral Daily   rosuvastatin   40 mg Oral Daily    Continuous Infusions:  heparin  1,250 Units/hr (02/01/24 0856)   nitroGLYCERIN       PRN Medications:  albuterol   Antimicrobials from admission:  Anti-infectives (From admission, onward)    None           Data Reviewed:  I have personally reviewed the following...  CBC: Recent Labs  Lab 01/31/24 1607 02/01/24 0513  WBC 9.7 8.0  NEUTROABS 7.1  --   HGB 15.0 15.0  HCT 46.4 46.7  MCV 95.1 96.1  PLT 268 254   Basic Metabolic Panel: Recent Labs  Lab 01/31/24 1607 02/01/24 0513  NA 137 141  K 4.0 4.0  CL 103 107  CO2 24 25  GLUCOSE 104* 106*  BUN 24* 25*  CREATININE 2.06* 1.94*  CALCIUM   8.4* 8.6*  MG 2.3  --   PHOS 3.6  --    GFR: Estimated Creatinine Clearance: 42.6 mL/min (A) (by C-G formula based on SCr of 1.94 mg/dL (H)). Liver Function Tests: Recent Labs  Lab 01/31/24 1607  AST 16  ALT 13  ALKPHOS 44  BILITOT 0.7  PROT 6.8  ALBUMIN  3.8   No results for input(s): "LIPASE", "AMYLASE" in the last 168 hours. No results for input(s): "AMMONIA" in the last 168 hours. Coagulation Profile: Recent Labs  Lab 01/31/24 1608  INR 1.1   Cardiac Enzymes: No results for input(s): "CKTOTAL", "CKMB", "CKMBINDEX", "TROPONINI" in the last 168 hours. BNP (last 3 results) No results for input(s): "PROBNP" in the last 8760 hours. HbA1C: No results for input(s): "HGBA1C" in the last 72 hours. CBG: No results for input(s): "GLUCAP" in the last 168 hours. Lipid Profile: Recent Labs    02/01/24 0513  CHOL 126  HDL 36*  LDLCALC 69  TRIG 161  CHOLHDL 3.5   Thyroid  Function Tests: Recent Labs    01/31/24 1607  TSH 3.477   Anemia Panel: No results for input(s): "VITAMINB12", "FOLATE", "FERRITIN", "TIBC", "IRON", "RETICCTPCT" in the last 72 hours. Most Recent Urinalysis On File:     Component Value Date/Time   COLORURINE STRAW (A) 05/25/2015 1014   APPEARANCEUR CLEAR (A) 05/25/2015 1014   LABSPEC 1.009 05/25/2015 1014   PHURINE 6.0 05/25/2015 1014   GLUCOSEU NEGATIVE 05/25/2015 1014   HGBUR NEGATIVE 05/25/2015 1014   BILIRUBINUR negative 04/03/2021 1343   KETONESUR NEGATIVE 05/25/2015 1014   PROTEINUR Negative 04/03/2021 1343   PROTEINUR NEGATIVE 05/25/2015 1014   UROBILINOGEN 0.2 04/03/2021 1343   NITRITE negative 04/03/2021 1343   NITRITE NEGATIVE 05/25/2015 1014   LEUKOCYTESUR Negative 04/03/2021 1343   Sepsis Labs: @LABRCNTIP (procalcitonin:4,lacticidven:4) Microbiology: No results found for this or any previous visit (from the past 240 hours).    Radiology Studies last 3 days: DG Chest Portable 1 View Result Date: 01/31/2024 CLINICAL DATA:   Central chest pain.  Chest CTA dated 11/13/2023. EXAM: PORTABLE CHEST 1 VIEW COMPARISON:  09/30/2023 and chest CTA dated 11/13/2023. FINDINGS: Interval mildly enlarged cardiac silhouette. Tortuous aorta. Clear lungs with normal vascularity. The lungs remain mildly hyperexpanded. Mild thoracic spine degenerative changes. Stable right subclavian AICD lead with its tip at the right ventricular apex. IMPRESSION: 1. Interval mild cardiomegaly. 2. No acute abnormality. 3. Mild COPD. Electronically Signed   By: Catherin Closs M.D.   On: 01/31/2024 16:45       Eshaan Titzer, DO [facility] 02/01/2024, 2:44 PM    Dictation software may have been used to generate the above note. Typos may occur and escape review in typed/dictated notes. Please contact Dr Authur Leghorn directly for clarity if needed.  Staff may message me via secure chat in Epic  but this may not receive an immediate response,  please page me for urgent matters!  If 7PM-7AM, please contact night coverage [email or web address]

## 2024-02-01 NOTE — Consult Note (Signed)
 [facility] CLINIC CARDIOLOGY CONSULT NOTE       Patient ID: Dylan Lee MRN: [id number] DOB/AGE: 03/18/1956 68 y.o.  Admit date: 01/31/2024 Referring Physician Dr Authur Leghorn Primary Physician Shann Darnel, Erlinda Haws, MD Primary Cardiologist [facility] Reason for Consultation VT  HPI: Dylan Lee is a 68 y.o. male  with a past medical history of CAD, HFrEF, nonischemic cardiomyopathy, Paroxysmal Afib, Hx SVT, AICD in place, AAA, COPD, OSA on CPAP, HLD, and HTN who presented to the ED on 01/31/2024 for VT.   Per EDP, 01/31/24 pt developed crushing chest pain at rest at home. EMS called, noted to be in VTach, BP 85/50. Uncertain if defibrillator activated. Pt received fluids, converted to sinus rhythm. EMS administered nitroglycerin  but this dropped BP. Pt pain essentially resolved, noting 1/10 dull pain.   This morning patient is doing very well. No complaints or concerns. He is not having any chest pain at all.    Review of systems complete and found to be negative unless listed above     Past Medical History:  Diagnosis Date   CHF (congestive heart failure) (HCC)    Encephalopathy due to COVID-19 virus 09/07/2021   Hepatitis C    Hep C treated    History of chicken pox    History of measles    History of mumps    Hyperlipidemia    Hypertension    Kidney stones    Pneumonia due to COVID-19 virus 09/07/2021   Shingles 09/06/2015    Past Surgical History:  Procedure Laterality Date   BUNIONECTOMY     CARDIAC CATHETERIZATION N/A 03/02/2015   Procedure: Right and Left Heart Cath;  Surgeon: Antonette Batters, MD;  Location: [facility] INVASIVE CV LAB;  Service: Cardiovascular;  Laterality: N/A;   COLONOSCOPY WITH PROPOFOL  N/A 08/07/2023   Procedure: COLONOSCOPY WITH PROPOFOL ;  Surgeon: Selena Daily, MD;  Location: [facility] ENDOSCOPY;  Service: Gastroenterology;  Laterality: N/A;   DENTAL SURGERY     FOOT SURGERY Right    HEMOSTASIS CLIP PLACEMENT  08/07/2023   Procedure:  HEMOSTASIS CLIP PLACEMENT;  Surgeon: Selena Daily, MD;  Location: [facility] ENDOSCOPY;  Service: Gastroenterology;;   HOT HEMOSTASIS  08/07/2023   Procedure: HOT HEMOSTASIS (ARGON PLASMA COAGULATION/BICAP);  Surgeon: Selena Daily, MD;  Location: [facility] ENDOSCOPY;  Service: Gastroenterology;;   IMPLANTABLE CARDIOVERTER DEFIBRILLATOR (ICD) GENERATOR CHANGE Right 06/03/2015   Procedure: ICD generator insertion ;  Surgeon: Jerrie Morales, MD;  Location: [facility] ORS;  Service: Cardiovascular;  Laterality: Right;   LITHOTRIPSY     POLYPECTOMY  08/07/2023   Procedure: POLYPECTOMY;  Surgeon: Selena Daily, MD;  Location: [facility] ENDOSCOPY;  Service: Gastroenterology;;   sleep study  07/2010   severe OSA, snoring. Bilevel pressure at 19/14. Jiengel. 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 30+ years ago  Vaping Use   Vaping status: Never Used  Substance and Sexual Activity   Alcohol use: No    Alcohol/week: 0.0 standard drinks of alcohol   Drug use: Yes   Sexual activity: Not on file  Other Topics Concern   Not on file  Social History Narrative   Not on file   Social Drivers of Health   Financial Resource Strain: Low Risk  (10/15/2023)   Overall Financial Resource Strain (CARDIA)    Difficulty of Paying  Living Expenses: Not hard at all  Food Insecurity: No Food Insecurity (10/15/2023)   Hunger Vital Sign    Worried About Running Out of Food in the Last Year: Never true    Ran Out of Food in the Last Year: Never true  Transportation Needs: No Transportation Needs (10/15/2023)   PRAPARE - Administrator, Civil Service (Medical): No    Lack of Transportation (Non-Medical): No  Recent Concern: Transportation Needs - Unmet Transportation Needs (09/30/2023)   PRAPARE - Administrator, Civil Service (Medical): Yes    Lack of Transportation (Non-Medical): No  Physical Activity: Inactive (10/15/2023)   Exercise Vital Sign    Days of Exercise per Week: 0 days    Minutes of Exercise per Session: 0 min  Stress: No Stress Concern Present (10/15/2023)   [employer] of Occupational Health - Occupational Stress Questionnaire    Feeling of Stress : Not at all  Social Connections: Moderately Integrated (10/15/2023)   Social Connection and Isolation Panel [NHANES]    Frequency of Communication with Friends and Family: Three times a week    Frequency of Social Gatherings with Friends and Family: Twice a week    Attends Religious Services: Never    Database administrator or Organizations: Yes    Attends Banker Meetings: Never    Marital Status: Married  Catering manager Violence: Not At Risk (10/15/2023)   Humiliation, Afraid, Rape, and Kick questionnaire    Fear of Current or Ex-Partner: No    Emotionally Abused: No    Physically Abused: No    Sexually Abused: No    Family History  Problem Relation Age of Onset   Hypertension Mother    Bipolar disorder Mother    Ovarian cancer Mother    Heart disease Mother    Diabetes Father    Alcohol abuse Father    CAD Father    Depression Father    Alcohol abuse Sister    Congestive Heart Failure Brother    Alcohol abuse Brother    Liver cancer Brother      Vitals:   02/01/24 0706 02/01/24 1000 02/01/24 1100  02/01/24 1301  BP:  116/68 127/80   Pulse:  65 63   Resp:  19 16   Temp:    98.4 F (36.9 C)  TempSrc:    Oral  SpO2: 95% 96% 96%   Weight:      Height:        PHYSICAL EXAM General: awake, well nourished, in no acute distress. HEENT: Normocephalic and atraumatic. Neck: No JVD.  Lungs: Normal respiratory effort. Clear bilaterally to auscultation. No wheezes, crackles, rhonchi.  Heart: HRRR. Normal S1 and S2 without gallops or murmurs.  Abdomen: Non-distended appearing.  Msk: Normal strength and tone for age. Extremities: Warm and well perfused. No clubbing, cyanosis.  no edema.  Neuro: Alert and oriented X 3. Psych: Answers questions appropriately.   Labs: Basic Metabolic Panel: Recent Labs    01/31/24 1607 02/01/24 0513  NA 137 141  K 4.0 4.0  CL 103 107  CO2 24 25  GLUCOSE 104* 106*  BUN 24* 25*  CREATININE 2.06* 1.94*  CALCIUM  8.4* 8.6*  MG 2.3  --   PHOS 3.6  --    Liver Function Tests: Recent Labs    01/31/24 1607  AST 16  ALT 13  ALKPHOS 44  BILITOT 0.7  PROT 6.8  ALBUMIN  3.8   No results for input(s): "LIPASE", "AMYLASE" in the last 72 hours. CBC: Recent Labs    01/31/24 1607 02/01/24 0513  WBC 9.7 8.0  NEUTROABS 7.1  --   HGB 15.0 15.0  HCT 46.4 46.7  MCV 95.1 96.1  PLT 268 254   Cardiac Enzymes: Recent Labs    01/31/24 1607 01/31/24 1847  TROPONINIHS 43* 49*   BNP: Recent Labs    01/31/24 1607  BNP 124.3*   D-Dimer: No results for input(s): "DDIMER" in the last 72 hours. Hemoglobin A1C: No results for input(s): "HGBA1C" in the last 72 hours. Fasting Lipid Panel: Recent Labs    02/01/24 0513  CHOL 126  HDL 36*  LDLCALC 69  TRIG 161  CHOLHDL 3.5   Thyroid  Function Tests: Recent Labs    01/31/24 1607  TSH 3.477   Anemia Panel: No results for input(s): "VITAMINB12", "FOLATE", "FERRITIN", "TIBC", "IRON", "RETICCTPCT" in the last 72 hours.   Radiology: DG Chest Portable 1 View Result Date: 01/31/2024 CLINICAL  DATA:  Central chest pain.  Chest CTA dated 11/13/2023. EXAM: PORTABLE CHEST 1 VIEW COMPARISON:  09/30/2023 and chest CTA dated 11/13/2023. FINDINGS: Interval mildly enlarged cardiac silhouette. Tortuous aorta. Clear lungs with normal vascularity. The lungs remain mildly hyperexpanded. Mild thoracic spine degenerative changes. Stable right subclavian AICD lead with its tip at the right ventricular apex. IMPRESSION: 1. Interval mild cardiomegaly. 2. No acute abnormality. 3. Mild COPD. Electronically Signed   By: Catherin Closs M.D.   On: 01/31/2024 16:45    ECHO pending  TELEMETRY reviewed by me [facility]) 02/01/2024 : NSR  EKG reviewed by me: NSR  Data reviewed by me [facility]) 02/01/2024: last 24h vitals tele labs imaging I/O provider notes  Principal Problem:   V-tach [facility])    ASSESSMENT AND PLAN:   Vtach NICM s/p ICD Paroxysmal Afib Hx SVT AAA [employer] device interrogation from January-March reviewed. Will reach out to [employer] for interrogation of yesterday's events. Patient reports no shocks from his device. Cardiac MRI ordered. Optimize lytes, K>4 and Mag>2 Continue heparin  gtt, home amiodarone . I have added PO Toprol  to his current regiment. No plans for cardiac cath this admission Maintain on telemetry   HLD, HTN  Continue home meds   Signed: Jalyne Brodzinski, DO 02/01/2024, 2:23 PM [facility] Cardiology

## 2024-02-01 NOTE — Consult Note (Signed)
 Pharmacy Consult Note - Anticoagulation  Pharmacy Consult for heparin  Indication: chest pain/ACS  PATIENT MEASUREMENTS: Height: 5\' 10"  (177.8 cm) Weight: 97.3 kg (214 lb 8.1 oz) IBW/kg (Calculated) : 73 HEPARIN  DW (KG): 93.1  VITAL SIGNS: Temp: 98.4 F (36.9 C) (05/17 0524) Temp Source: Oral (05/17 0524) BP: 133/68 (05/17 0700) Pulse Rate: 58 (05/17 0700)  Recent Labs    01/31/24 1608 01/31/24 1847 02/01/24 0110 02/01/24 0513 02/01/24 0808  HGB  --   --   --  15.0  --   HCT  --   --   --  46.7  --   PLT  --   --   --  254  --   APTT 29  --   --   --   --   LABPROT 14.0  --   --   --   --   INR 1.1  --   --   --   --   HEPARINUNFRC  --   --    < >  --  0.30  CREATININE  --   --   --  1.94*  --   TROPONINIHS  --  49*  --   --   --    < > = values in this interval not displayed.    Estimated Creatinine Clearance: 42.6 mL/min (A) (by C-G formula based on SCr of 1.94 mg/dL (H)).  PAST MEDICAL HISTORY: Past Medical History:  Diagnosis Date   CHF (congestive heart failure) (HCC)    Encephalopathy due to COVID-19 virus 09/07/2021   Hepatitis C    Hep C treated    History of chicken pox    History of measles    History of mumps    Hyperlipidemia    Hypertension    Kidney stones    Pneumonia due to COVID-19 virus 09/07/2021   Shingles 09/06/2015    ASSESSMENT: 68 y.o. male with PMH including is presenting with chest pain/ACS. Mildly elevated cTn. Patient is not on chronic anticoagulation per chart review. Pharmacy has been consulted to initiate and manage heparin  intravenous infusion.  Pertinent medications: No chronic anticoagulation prior to admission,   Goal(s) of therapy: Heparin  level 0.3 - 0.7 units/mL Monitor platelets by anticoagulation protocol: Yes   Baseline anticoagulation labs: Recent Labs    01/31/24 1607 01/31/24 1608 02/01/24 0513  APTT  --  29  --   INR  --  1.1  --   HGB 15.0  --  15.0  PLT 268  --  254  Baseline labs have been  ordered.  Date Time aPTT/HL Rate/Comment 05/17 0110 HL = 0.30 Therapeutic x 1 05/17 0808 HL = 0.3     PLAN: Heparin  level is therapeutic but at the lower limit of goal. Will increase heparin  infusion to 1250 units/hr. Recheck heparin  level and CBC with AM labs.   Harlie Lieu, PharmD 02/01/2024 8:42 AM

## 2024-02-02 ENCOUNTER — Encounter: Payer: Self-pay | Admitting: Osteopathic Medicine

## 2024-02-02 DIAGNOSIS — I472 Ventricular tachycardia, unspecified: Secondary | ICD-10-CM | POA: Diagnosis not present

## 2024-02-02 LAB — CBC
HCT: 46.6 % (ref 39.0–52.0)
Hemoglobin: 15.1 g/dL (ref 13.0–17.0)
MCH: 30.4 pg (ref 26.0–34.0)
MCHC: 32.4 g/dL (ref 30.0–36.0)
MCV: 94 fL (ref 80.0–100.0)
Platelets: 238 10*3/uL (ref 150–400)
RBC: 4.96 MIL/uL (ref 4.22–5.81)
RDW: 14.6 % (ref 11.5–15.5)
WBC: 9.1 10*3/uL (ref 4.0–10.5)
nRBC: 0 % (ref 0.0–0.2)

## 2024-02-02 LAB — BASIC METABOLIC PANEL WITH GFR
Anion gap: 7 (ref 5–15)
BUN: 23 mg/dL (ref 8–23)
CO2: 25 mmol/L (ref 22–32)
Calcium: 8.7 mg/dL — ABNORMAL LOW (ref 8.9–10.3)
Chloride: 107 mmol/L (ref 98–111)
Creatinine, Ser: 1.73 mg/dL — ABNORMAL HIGH (ref 0.61–1.24)
GFR, Estimated: 42 mL/min — ABNORMAL LOW (ref >60)
Glucose, Bld: 103 mg/dL — ABNORMAL HIGH (ref 70–99)
Potassium: 4 mmol/L (ref 3.5–5.1)
Sodium: 139 mmol/L (ref 135–145)

## 2024-02-02 LAB — ECHOCARDIOGRAM COMPLETE
AV Peak grad: 10.5 mmHg
Ao pk vel: 1.62 m/s
Area-P 1/2: 3.7 cm2
Height: 70 in
Weight: 3432.12 [oz_av]

## 2024-02-02 LAB — HEPARIN LEVEL (UNFRACTIONATED): Heparin Unfractionated: 0.34 [IU]/mL (ref 0.30–0.70)

## 2024-02-02 LAB — MAGNESIUM: Magnesium: 2.4 mg/dL (ref 1.7–2.4)

## 2024-02-02 MED ORDER — VENLAFAXINE HCL ER 150 MG PO CP24: 150.0000 mg | ORAL_CAPSULE | Freq: Every evening | ORAL | Status: DC

## 2024-02-02 MED ORDER — METOPROLOL SUCCINATE ER 25 MG PO TB24: 25.0000 mg | ORAL_TABLET | Freq: Every day | ORAL | 0 refills | Status: AC

## 2024-02-02 NOTE — Progress Notes (Signed)
 [facility] CLINIC CARDIOLOGY PROGRESS NOTE   Patient ID: Dylan Lee MRN: [id number] DOB/AGE: [birth year] 69 y.o.  Admit date: 01/31/2024 Referring Physician Dr Authur Leghorn Primary Physician Shann Darnel, Erlinda Haws, MD Primary Cardiologist [facility] Reason for Consultation VT   HPI: Dylan Lee is a 68 y.o. male  with a past medical history of CAD, HFrEF, nonischemic cardiomyopathy, Paroxysmal Afib, Hx SVT, AICD in place, AAA, COPD, OSA on CPAP, HLD, and HTN who presented to the ED on 01/31/2024 for VT.    Per EDP, 01/31/24 pt developed crushing chest pain at rest at home. EMS called, noted to be in VTach, BP 85/50. Uncertain if defibrillator activated. Pt received fluids, converted to sinus rhythm. EMS administered nitroglycerin  but this dropped BP. Pt pain essentially resolved, noting 1/10 dull pain.  Interval History:  -Patient seen and examined at bedside, resting comfortably. No events overnight. Denies chest pain, shortness of breath, palpitations, diaphoresis, syncope, edema, PND, orthopnea.  Review of systems complete and found to be negative unless listed above    Vitals:   02/02/24 0400 02/02/24 0500 02/02/24 0600 02/02/24 0700  BP: (!) 154/81 (!) 180/82 (!) 140/84 (!) 160/85  Pulse: 68 65 67 74  Resp: (!) 21 18 18  (!) 23  Temp:      TempSrc:      SpO2: 92% 93% 93% 92%  Weight:      Height:         Intake/Output Summary (Last 24 hours) at 02/02/2024 1231 Last data filed at 02/02/2024 1136 Gross per 24 hour  Intake --  Output 400 ml  Net -400 ml     PHYSICAL EXAM General: awake, well nourished, in no acute distress. HEENT: Normocephalic and atraumatic. Neck: No JVD.  Lungs: Normal respiratory effort. Clear bilaterally to auscultation. No wheezes, crackles, rhonchi.  Heart: HRRR. Normal S1 and S2 without gallops or murmurs.  Abdomen: Non-distended appearing.  Msk: Normal strength and tone for age. Extremities: Warm and well perfused. No clubbing, cyanosis. no  edema.  Neuro: Alert and oriented X 3. Psych: Answers questions appropriately.    LABS: Basic Metabolic Panel: Recent Labs    01/31/24 1607 02/01/24 0513 02/02/24 0727  NA 137 141 139  K 4.0 4.0 4.0  CL 103 107 107  CO2 24 25 25   GLUCOSE 104* 106* 103*  BUN 24* 25* 23  CREATININE 2.06* 1.94* 1.73*  CALCIUM  8.4* 8.6* 8.7*  MG 2.3  --  2.4  PHOS 3.6  --   --    Liver Function Tests: Recent Labs    01/31/24 1607  AST 16  ALT 13  ALKPHOS 44  BILITOT 0.7  PROT 6.8  ALBUMIN  3.8   No results for input(s): "LIPASE", "AMYLASE" in the last 72 hours. CBC: Recent Labs    01/31/24 1607 02/01/24 0513 02/02/24 0727  WBC 9.7 8.0 9.1  NEUTROABS 7.1  --   --   HGB 15.0 15.0 15.1  HCT 46.4 46.7 46.6  MCV 95.1 96.1 94.0  PLT 268 254 238   Cardiac Enzymes: Recent Labs    01/31/24 1607 01/31/24 1847  TROPONINIHS 43* 49*   BNP: Recent Labs    01/31/24 1607  BNP 124.3*   D-Dimer: No results for input(s): "DDIMER" in the last 72 hours. Hemoglobin A1C: No results for input(s): "HGBA1C" in the last 72 hours. Fasting Lipid Panel: Recent Labs    02/01/24 0513  CHOL 126  HDL 36*  LDLCALC 69  TRIG 409  CHOLHDL 3.5  Thyroid  Function Tests: Recent Labs    01/31/24 1607  TSH 3.477   Anemia Panel: No results for input(s): "VITAMINB12", "FOLATE", "FERRITIN", "TIBC", "IRON", "RETICCTPCT" in the last 72 hours.  ECHOCARDIOGRAM COMPLETE Result Date: 02/02/2024    ECHOCARDIOGRAM REPORT   Patient Name:   Dylan Lee Date of Exam: 02/01/2024 Medical Rec #:  [id number]        Height:       70.0 in Accession #:    [id number]       Weight:       214.5 lb Date of Birth:  1955/11/21        BSA:          2.150 m Patient Age:    67 years         BP:           127/80 mmHg Patient Gender: M                HR:           60 bpm. Exam Location:  [facility] Procedure: 2D Echo, Cardiac Doppler, Color Doppler and Intracardiac            Opacification Agent (Both Spectral and Color Flow  Doppler were            utilized during procedure). Indications:     Cardiomyopathy I42.9  History:         Patient has no prior history of Echocardiogram examinations.  Sonographer:     Clenton Czech RDCS, FASE Referring Phys:  [id number] Melodi Sprung Diagnosing Phys: Isabell Manzanilla  Sonographer Comments: Technically challenging study due to limited acoustic windows, no parasternal window, suboptimal subcostal window and suboptimal apical window. Image acquisition challenging due to patient body habitus and Image acquisition challenging due to respiratory motion. Acoustically limited study even with the use of Definity ultrasound imaging agent. IMPRESSIONS  1. Entire study needs to be redone due to poor images. LVEF appears relatively preserved based on Definity images. All valves are very poorly visualized. FINDINGS  Left Ventricle: Entire study needs to be redone due to poor images. LVEF appears relatively preserved based on Definity images. All valves are very poorly visualized. Definity contrast agent was given IV to delineate the left ventricular endocardial borders. Aortic Valve: Aortic valve peak gradient measures 10.5 mmHg.   Diastology LV e' medial:    5.00 cm/s LV E/e' medial:  6.6 LV e' lateral:   5.33 cm/s LV E/e' lateral: 6.2  AORTIC VALVE AV Vmax:      162.00 cm/s AV Peak Grad: 10.5 mmHg LVOT Vmax:    72.70 cm/s LVOT Vmean:   50.800 cm/s LVOT VTI:     0.120 m MITRAL VALVE MV Area (PHT): 3.70 cm    SHUNTS MV Decel Time: 205 msec    Systemic VTI: 0.12 m MV E velocity: 33.00 cm/s MV A velocity: 52.90 cm/s MV E/A ratio:  0.62 Benjamyn Hestand Electronically signed by Isabell Manzanilla Signature Date/Time: 02/02/2024/12:18:44 PM    Final    DG Chest Portable 1 View Result Date: 01/31/2024 CLINICAL DATA:  Central chest pain.  Chest CTA dated 11/13/2023. EXAM: PORTABLE CHEST 1 VIEW COMPARISON:  09/30/2023 and chest CTA dated 11/13/2023. FINDINGS: Interval mildly enlarged cardiac silhouette. Tortuous  aorta. Clear lungs with normal vascularity. The lungs remain mildly hyperexpanded. Mild thoracic spine degenerative changes. Stable right subclavian AICD lead with its tip at the right ventricular apex. IMPRESSION: 1. Interval mild cardiomegaly. 2. No acute abnormality. 3.  Mild COPD. Electronically Signed   By: Catherin Closs M.D.   On: 01/31/2024 16:45     ECHO LVEF preserved  TELEMETRY reviewed by me 02/02/24: NSR  EKG reviewed by me 02/02/24: NSR  DATA reviewed by me 02/02/24: last 24h vitals tele labs imaging I/O provider notes    ASSESSMENT AND PLAN:  Principal Problem:   V-tach (HCC)   Vtach NICM s/p ICD Paroxysmal Afib Hx SVT AAA [employer] device interrogation from January-March reviewed. Will reach out to [employer] for interrogation of yesterday's events. Patient reports no shocks from his device. Cardiac MRI ordered however cannot be performed at [facility] even though device is MRI compatible. We will obtain cMRI outpatient. LVEF on echo seems relatively well preserved despite very poor acoustic images. Optimize lytes, K>4 and Mag>2 Continue heparin  gtt, home amiodarone . I have added PO Toprol  to his current regiment. No plans for cardiac cath this admission Maintain on telemetry Patient stable for d/c home from cardiac standpoint.     HLD, HTN  Continue home meds  Isabell Manzanilla, DO 02/02/2024, 12:31 PM [facility] Card[facility]

## 2024-02-02 NOTE — ED Notes (Signed)
 This RN received report from [employer] and performed bedside care handoff. This RN introduced self to pt. Call light in reach, bed wheels locked, side rails raised, pt updated on plan of care. High fall risk precautions in place. Rounding completed.

## 2024-02-02 NOTE — Consult Note (Signed)
 Pharmacy Consult Note - Anticoagulation  Pharmacy Consult for heparin  Indication: chest pain/ACS and afib.   PATIENT MEASUREMENTS: Height: 5\' 10"  (177.8 cm) Weight: 97.3 kg (214 lb 8.1 oz) IBW/kg (Calculated) : 73 HEPARIN  DW (KG): 93.1  VITAL SIGNS: Temp: 98 F (36.7 C) (05/18 0000) BP: 160/85 (05/18 0700) Pulse Rate: 74 (05/18 0700)  Recent Labs    01/31/24 1608 01/31/24 1847 02/01/24 0110 02/01/24 0513 02/01/24 0808 02/02/24 0727  HGB  --   --   --  15.0  --  15.1  HCT  --   --   --  46.7  --  46.6  PLT  --   --   --  254  --  238  APTT 29  --   --   --   --   --   LABPROT 14.0  --   --   --   --   --   INR 1.1  --   --   --   --   --   HEPARINUNFRC  --   --    < >  --    < > 0.34  CREATININE  --   --   --  1.94*  --   --   TROPONINIHS  --  49*  --   --   --   --    < > = values in this interval not displayed.    Estimated Creatinine Clearance: 42.6 mL/min (A) (by C-G formula based on SCr of 1.94 mg/dL (H)).  PAST MEDICAL HISTORY: Past Medical History:  Diagnosis Date   CHF (congestive heart failure) (HCC)    Encephalopathy due to COVID-19 virus 09/07/2021   Hepatitis C    Hep C treated    History of chicken pox    History of measles    History of mumps    Hyperlipidemia    Hypertension    Kidney stones    Pneumonia due to COVID-19 virus 09/07/2021   Shingles 09/06/2015    ASSESSMENT: 68 y.o. male with PMH including is presenting with chest pain/ACS. Mildly elevated cTn. Patient is not on chronic anticoagulation per chart review. Pharmacy has been consulted to initiate and manage heparin  intravenous infusion.  Pertinent medications: No chronic anticoagulation prior to admission,   Goal(s) of therapy: Heparin  level 0.3 - 0.7 units/mL Monitor platelets by anticoagulation protocol: Yes   Baseline anticoagulation labs: Recent Labs    01/31/24 1607 01/31/24 1608 02/01/24 0513 02/02/24 0727  APTT  --  29  --   --   INR  --  1.1  --   --   HGB  15.0  --  15.0 15.1  PLT 268  --  254 238  Baseline labs have been ordered.  Date Time aPTT/HL Rate/Comment 05/17 0110 HL = 0.30 Therapeutic x 1 05/17 0808 HL = 0.3  05/18 0727 HL 0.34     PLAN: Heparin  level is therapeutic. Will increase heparin  infusion to 1250 units/hr. Recheck heparin  level and CBC with AM labs.   Harlie Lieu, PharmD 02/02/2024 8:00 AM

## 2024-02-02 NOTE — Discharge Summary (Signed)
 Physician Discharge Summary   Patient: Dylan Lee MRN: [id number]  DOB: December 18, 1955   Admit:     Date of Admission: 01/31/2024 Admitted from: home   Discharge: Date of discharge: 02/02/24 Disposition: Home Condition at discharge: good  CODE STATUS: FULL CODE     Discharge Physician: Melodi Sprung, DO [facility]     PCP: Lamon Pillow, MD  Recommendations for Outpatient Follow-up:  Follow up with PCP Lamon Pillow, MD in 1-2 weeks Follow up with cardiology for cardiac MRI / hospital recheck     Discharge Instructions     (HEART FAILURE PATIENTS) Call MD:  Anytime you have any of the following symptoms: 1) 3 pound weight gain in 24 hours or 5 pounds in 1 week 2) shortness of breath, with or without a dry hacking cough 3) swelling in the hands, feet or stomach 4) if you have to sleep on extra pillows at night in order to breathe.   Complete by: As directed    Diet - low sodium heart healthy   Complete by: As directed    Increase activity slowly   Complete by: As directed          Discharge Diagnoses: Principal Problem:   V-tach [facility])    Hospital Course / Significant events:  HPI: Dylan Lee is a 68 y.o. male w/ PMH CAD, HFrEF, nonischemic cardiomyopathy, Paroxysmal Afib, Hx SVT, AICD in place, AAA, COPD, OSA on CPAP, HLD, HTN, CKD 3a, former tobacco user, GERD, Hep C. On 01/31/24 developed crushing chest pain at rest at home. EMS called, noted to be in VTach, BP 85/50. Uncertain if defibrillator activated. Pt received fluids, converted to sinus rhythm. EMS administered nitroglycerin  but this dropped BP.   05/16: In ED, troponin 30s, repeat pending at time of admission. Cr 2.06 and GFR 34 slightly worse from baseline but recently check at at Cr 1.99 and GFR 36 outpatient. CXR nonacute. EKG sinus. No other concerns on CBC, CMP, TSH. Tx: heparin  drip, nitroglycerin  drip EDP spoke w/ cardiology on-call, recommendation for heparin  gtt,  nitroglycerin  gtt if BP will tolerate. Pt pain essentially resolved, noting 1/10 dull pain. Hospitalist called for admission 05/17: [employer] device interrogation from January-March reviewed by cardiology, they will reach out to [employer] for interrogation of 05/16 events. Planning cardiac [facility] 05/19. Continue heparin , amiodarone , added po metoprolol . No plans for cardiac cath.  05/18: unable to do MRI here d/t pacemaker though it is compatible, will need to arrange this outpatient. Per cardiology he is clear for discharge to follow outpatient.       Consultants:  Cardiology - [facility]    Procedures/Surgeries: none           ASSESSMENT & PLAN:   Ventricular fibrillation Unstable Angina History of CAD, HFrEF, nonischemic cardiomyopathy, Paroxysmal Afib, Hx SVT, AAA, HLD, HTN, AICD in place Home cardiac meds include: amiodarone  200 mg daily, ASA 81 mg daily, Jardiance 10 mg daily, Crestor  40 mg daily, Entresto  97-103 mg bid, spironolactone  12.5 mg daily, torsemide  40 mg daily. He is NOT on anticoagulation.  Cardiology following  Admission to progressive unit Continue ASA, Crestor , amiodarone  Cardiology added metoprolol   Heparin  gtt Plan Cardiac MRI resume Entresto , Jardiance, spiro, torsemide  since BP improved/high Hold spironolactone  pending outpatient BP check  Pt is FULL CODE   COPD Chronic hypoxic respiratory failure  O2 via [place] to maintain sats above 88% Resume home inhalers/nebs   OSA on CPAP Continue CPAP qhs  CKD 3a Monitor BMP outpatient    GERD PPI  Anxiety/depression Resume home Effexor           Class 1 obesity based on BMI: Body mass index is 30.78 kg/m.Aaron Aas Significantly low or high BMI is associated with higher medical risk.  Underweight - under 18  overweight - 25 to 29 obese - 30 or more Class 1 obesity: BMI of 30.0 to 34 Class 2 obesity: BMI of 35.0 to 39 Class 3 obesity: BMI of 40.0 to 49 Super Morbid Obesity: BMI  50-59 Super-super Morbid Obesity: BMI 60+ Healthy nutrition and physical activity advised as adjunct to other disease management and risk reduction treatments                Discharge Instructions  Allergies as of 02/02/2024       Reactions   Quinolones Other (See Comments)   Fluroquinolone antibiotics should be avoided in patients with aortic disease unless alternative therapy is not an option.        Medication List     PAUSE taking these medications    spironolactone  25 MG tablet Wait to take this until your doctor or other care provider tells you to start again. Commonly known as: ALDACTONE  Take 12.5 mg by mouth daily. Half tablet       STOP taking these medications    dapagliflozin  propanediol 10 MG Tabs tablet Commonly known as: Farxiga        TAKE these medications    albuterol  108 (90 Base) MCG/ACT inhaler Commonly known as: VENTOLIN  HFA Inhale 2 puffs into the lungs every 6 (six) hours as needed for wheezing or shortness of breath.   amiodarone  200 MG tablet Commonly known as: PACERONE  Take 200 mg by mouth daily.   aspirin  81 MG tablet Take 81 mg by mouth daily.   diphenoxylate -atropine  2.5-0.025 MG tablet Commonly known as: Lomotil  Take 2 tablets by mouth 2 (two) times daily as needed for diarrhea or loose stools.   Entresto  97-103 MG Generic drug: sacubitril -valsartan  Take 1 tablet by mouth 2 (two) times daily.   ipratropium 17 MCG/ACT inhaler Commonly known as: ATROVENT  HFA Inhale 2 puffs into the lungs every 8 (eight) hours.   Jardiance 10 MG Tabs tablet Generic drug: empagliflozin Take 10 mg by mouth daily.   metoprolol  succinate 25 MG 24 hr tablet Commonly known as: TOPROL -XL Take 1 tablet (25 mg total) by mouth daily. Start taking on: Feb 03, 2024   montelukast  10 MG tablet Commonly known as: SINGULAIR  TAKE 1 TABLET BY MOUTH AT BEDTIME . APPOINTMENT REQUIRED FOR FUTURE REFILLS   omeprazole 20 MG capsule Commonly known  as: PRILOSEC Take 20 mg by mouth at bedtime.   roflumilast 500 MCG Tabs tablet Commonly known as: DALIRESP Take 500 mcg by mouth daily.   rosuvastatin  40 MG tablet Commonly known as: CRESTOR  Take 1 tablet by mouth once daily What changed: when to take this   torsemide  20 MG tablet Commonly known as: DEMADEX  Take 40 mg by mouth daily.   venlafaxine  XR 150 MG 24 hr capsule Commonly known as: EFFEXOR -XR Take 1 capsule (150 mg total) by mouth at bedtime.          Allergies  Allergen Reactions   Quinolones Other (See Comments)    Fluroquinolone antibiotics should be avoided in patients with aortic disease unless alternative therapy is not an option.     Subjective: pt reports he feels fine today, no concerns, no chest pain or SOB, he is tolerating  diet, ambulating without chest pain   Discharge Exam: BP (!) 160/85   Pulse 66   Temp 98.3 F (36.8 C) (Oral)   Resp (!) 23   Ht 5\' 10"  (1.778 m)   Wt 97.3 kg   SpO2 96%   BMI 30.78 kg/m  General: Pt is alert, awake, not in acute distress Cardiovascular: RRR, S1/S2 +murmur, no rubs, no gallops Respiratory: CTA bilaterally, no wheezing, no rhonchi Abdominal: Soft, NT, ND, bowel sounds + Extremities: no edema, no cyanosis     The results of significant diagnostics from this hospitalization (including imaging, microbiology, ancillary and laboratory) are listed below for reference.     Microbiology: No results found for this or any previous visit (from the past 240 hours).   Labs: BNP (last 3 results) Recent Labs    01/31/24 1607  BNP 124.3*   Basic Metabolic Panel: Recent Labs  Lab 01/31/24 1607 02/01/24 0513 02/02/24 0727  NA 137 141 139  K 4.0 4.0 4.0  CL 103 107 107  CO2 24 25 25   GLUCOSE 104* 106* 103*  BUN 24* 25* 23  CREATININE 2.06* 1.94* 1.73*  CALCIUM  8.4* 8.6* 8.7*  MG 2.3  --  2.4  PHOS 3.6  --   --    Liver Function Tests: Recent Labs  Lab 01/31/24 1607  AST 16  ALT 13  ALKPHOS  44  BILITOT 0.7  PROT 6.8  ALBUMIN  3.8   No results for input(s): "LIPASE", "AMYLASE" in the last 168 hours. No results for input(s): "AMMONIA" in the last 168 hours. CBC: Recent Labs  Lab 01/31/24 1607 02/01/24 0513 02/02/24 0727  WBC 9.7 8.0 9.1  NEUTROABS 7.1  --   --   HGB 15.0 15.0 15.1  HCT 46.4 46.7 46.6  MCV 95.1 96.1 94.0  PLT 268 254 238   Cardiac Enzymes: No results for input(s): "CKTOTAL", "CKMB", "CKMBINDEX", "TROPONINI" in the last 168 hours. BNP: Invalid input(s): "POCBNP" CBG: No results for input(s): "GLUCAP" in the last 168 hours. D-Dimer No results for input(s): "DDIMER" in the last 72 hours. Hgb A1c No results for input(s): "HGBA1C" in the last 72 hours. Lipid Profile Recent Labs    02/01/24 0513  CHOL 126  HDL 36*  LDLCALC 69  TRIG 811  CHOLHDL 3.5   Thyroid  function studies Recent Labs    01/31/24 1607  TSH 3.477   Anemia work up No results for input(s): "VITAMINB12", "FOLATE", "FERRITIN", "TIBC", "IRON", "RETICCTPCT" in the last 72 hours. Urinalysis    Component Value Date/Time   COLORURINE STRAW (A) 05/25/2015 1014   APPEARANCEUR CLEAR (A) 05/25/2015 1014   LABSPEC 1.009 05/25/2015 1014   PHURINE 6.0 05/25/2015 1014   GLUCOSEU NEGATIVE 05/25/2015 1014   HGBUR NEGATIVE 05/25/2015 1014   BILIRUBINUR negative 04/03/2021 1343   KETONESUR NEGATIVE 05/25/2015 1014   PROTEINUR Negative 04/03/2021 1343   PROTEINUR NEGATIVE 05/25/2015 1014   UROBILINOGEN 0.2 04/03/2021 1343   NITRITE negative 04/03/2021 1343   NITRITE NEGATIVE 05/25/2015 1014   LEUKOCYTESUR Negative 04/03/2021 1343   Sepsis Labs Recent Labs  Lab 01/31/24 1607 02/01/24 0513 02/02/24 0727  WBC 9.7 8.0 9.1   Microbiology No results found for this or any previous visit (from the past 240 hours). Imaging ECHOCARDIOGRAM COMPLETE Result Date: 02/02/2024    ECHOCARDIOGRAM REPORT   Patient Name:   Dylan Lee Date of Exam: 02/01/2024 Medical Rec #:  [id number]         Height:  70.0 in Accession #:    [id number]       Weight:       214.5 lb Date of Birth:  Jun 10, 1956        BSA:          2.150 m Patient Age:    67 years         BP:           127/80 mmHg Patient Gender: M                HR:           60 bpm. Exam Location:  [facility] Procedure: 2D Echo, Cardiac Doppler, Color Doppler and Intracardiac            Opacification Agent (Both Spectral and Color Flow Doppler were            utilized during procedure). Indications:     Cardiomyopathy I42.9  History:         Patient has no prior history of Echocardiogram examinations.  Sonographer:     Clenton Czech RDCS, FASE Referring Phys:  [id number] Melodi Sprung Diagnosing Phys: Isabell Manzanilla  Sonographer Comments: Technically challenging study due to limited acoustic windows, no parasternal window, suboptimal subcostal window and suboptimal apical window. Image acquisition challenging due to patient body habitus and Image acquisition challenging due to respiratory motion. Acoustically limited study even with the use of Definity ultrasound imaging agent. IMPRESSIONS  1. Entire study needs to be redone due to poor images. LVEF appears relatively preserved based on Definity images. All valves are very poorly visualized. FINDINGS  Left Ventricle: Entire study needs to be redone due to poor images. LVEF appears relatively preserved based on Definity images. All valves are very poorly visualized. Definity contrast agent was given IV to delineate the left ventricular endocardial borders. Aortic Valve: Aortic valve peak gradient measures 10.5 mmHg.   Diastology LV e' medial:    5.00 cm/s LV E/e' medial:  6.6 LV e' lateral:   5.33 cm/s LV E/e' lateral: 6.2  AORTIC VALVE AV Vmax:      162.00 cm/s AV Peak Grad: 10.5 mmHg LVOT Vmax:    72.70 cm/s LVOT Vmean:   50.800 cm/s LVOT VTI:     0.120 m MITRAL VALVE MV Area (PHT): 3.70 cm    SHUNTS MV Decel Time: 205 msec    Systemic VTI: 0.12 m MV E velocity: 33.00 cm/s MV A velocity: 52.90  cm/s MV E/A ratio:  0.62 Sabina Custovic Electronically signed by Isabell Manzanilla Signature Date/Time: 02/02/2024/12:18:44 PM    Final       Time coordinating discharge: over 30 minutes  SIGNED:  Melodi Sprung DO [facility]

## 2024-02-02 NOTE — ED Notes (Signed)
 Pt had an BM while in bed and needed to go to restroom. Pt was cleaned up by Clinical research associate and NT sage. Gown was placed on pt and bed pad was replaced.

## 2024-02-04 DIAGNOSIS — I471 Supraventricular tachycardia, unspecified: Secondary | ICD-10-CM | POA: Diagnosis not present

## 2024-02-04 DIAGNOSIS — G4733 Obstructive sleep apnea (adult) (pediatric): Secondary | ICD-10-CM | POA: Diagnosis not present

## 2024-02-04 DIAGNOSIS — I480 Paroxysmal atrial fibrillation: Secondary | ICD-10-CM | POA: Diagnosis not present

## 2024-02-04 DIAGNOSIS — I2510 Atherosclerotic heart disease of native coronary artery without angina pectoris: Secondary | ICD-10-CM | POA: Diagnosis not present

## 2024-02-04 DIAGNOSIS — E782 Mixed hyperlipidemia: Secondary | ICD-10-CM | POA: Diagnosis not present

## 2024-02-04 DIAGNOSIS — I428 Other cardiomyopathies: Secondary | ICD-10-CM | POA: Diagnosis not present

## 2024-02-04 DIAGNOSIS — N1831 Chronic kidney disease, stage 3a: Secondary | ICD-10-CM | POA: Diagnosis not present

## 2024-02-04 DIAGNOSIS — J449 Chronic obstructive pulmonary disease, unspecified: Secondary | ICD-10-CM | POA: Diagnosis not present

## 2024-02-04 DIAGNOSIS — I5022 Chronic systolic (congestive) heart failure: Secondary | ICD-10-CM | POA: Diagnosis not present

## 2024-02-04 DIAGNOSIS — I10 Essential (primary) hypertension: Secondary | ICD-10-CM | POA: Diagnosis not present

## 2024-02-04 DIAGNOSIS — Z95810 Presence of automatic (implantable) cardiac defibrillator: Secondary | ICD-10-CM | POA: Diagnosis not present

## 2024-02-06 DIAGNOSIS — J449 Chronic obstructive pulmonary disease, unspecified: Secondary | ICD-10-CM | POA: Diagnosis not present

## 2024-02-07 ENCOUNTER — Inpatient Hospital Stay: Admitting: Family Medicine

## 2024-02-13 ENCOUNTER — Other Ambulatory Visit: Payer: Self-pay | Admitting: Family Medicine

## 2024-02-13 DIAGNOSIS — J301 Allergic rhinitis due to pollen: Secondary | ICD-10-CM

## 2024-02-24 ENCOUNTER — Other Ambulatory Visit: Payer: Self-pay | Admitting: Family Medicine

## 2024-02-24 DIAGNOSIS — F32A Depression, unspecified: Secondary | ICD-10-CM

## 2024-03-04 DIAGNOSIS — I5022 Chronic systolic (congestive) heart failure: Secondary | ICD-10-CM | POA: Diagnosis not present

## 2024-03-04 DIAGNOSIS — Z95810 Presence of automatic (implantable) cardiac defibrillator: Secondary | ICD-10-CM | POA: Diagnosis not present

## 2024-03-10 DIAGNOSIS — E782 Mixed hyperlipidemia: Secondary | ICD-10-CM | POA: Diagnosis not present

## 2024-03-10 DIAGNOSIS — I428 Other cardiomyopathies: Secondary | ICD-10-CM | POA: Diagnosis not present

## 2024-03-10 DIAGNOSIS — I714 Abdominal aortic aneurysm, without rupture, unspecified: Secondary | ICD-10-CM | POA: Diagnosis not present

## 2024-03-10 DIAGNOSIS — I5022 Chronic systolic (congestive) heart failure: Secondary | ICD-10-CM | POA: Diagnosis not present

## 2024-03-10 DIAGNOSIS — I471 Supraventricular tachycardia, unspecified: Secondary | ICD-10-CM | POA: Diagnosis not present

## 2024-03-10 DIAGNOSIS — I2510 Atherosclerotic heart disease of native coronary artery without angina pectoris: Secondary | ICD-10-CM | POA: Diagnosis not present

## 2024-03-10 DIAGNOSIS — J449 Chronic obstructive pulmonary disease, unspecified: Secondary | ICD-10-CM | POA: Diagnosis not present

## 2024-03-10 DIAGNOSIS — N1831 Chronic kidney disease, stage 3a: Secondary | ICD-10-CM | POA: Diagnosis not present

## 2024-03-10 DIAGNOSIS — G4733 Obstructive sleep apnea (adult) (pediatric): Secondary | ICD-10-CM | POA: Diagnosis not present

## 2024-03-10 DIAGNOSIS — I480 Paroxysmal atrial fibrillation: Secondary | ICD-10-CM | POA: Diagnosis not present

## 2024-03-10 DIAGNOSIS — Z95810 Presence of automatic (implantable) cardiac defibrillator: Secondary | ICD-10-CM | POA: Diagnosis not present

## 2024-03-10 DIAGNOSIS — I10 Essential (primary) hypertension: Secondary | ICD-10-CM | POA: Diagnosis not present

## 2024-03-25 DIAGNOSIS — Z9981 Dependence on supplemental oxygen: Secondary | ICD-10-CM | POA: Diagnosis not present

## 2024-03-25 DIAGNOSIS — I7121 Aneurysm of the ascending aorta, without rupture: Secondary | ICD-10-CM | POA: Diagnosis not present

## 2024-03-25 DIAGNOSIS — G4733 Obstructive sleep apnea (adult) (pediatric): Secondary | ICD-10-CM | POA: Diagnosis not present

## 2024-03-25 DIAGNOSIS — J449 Chronic obstructive pulmonary disease, unspecified: Secondary | ICD-10-CM | POA: Diagnosis not present

## 2024-03-27 DIAGNOSIS — M7989 Other specified soft tissue disorders: Secondary | ICD-10-CM | POA: Diagnosis not present

## 2024-03-27 DIAGNOSIS — I2510 Atherosclerotic heart disease of native coronary artery without angina pectoris: Secondary | ICD-10-CM | POA: Diagnosis not present

## 2024-03-27 DIAGNOSIS — M79669 Pain in unspecified lower leg: Secondary | ICD-10-CM | POA: Diagnosis not present

## 2024-03-31 ENCOUNTER — Encounter: Payer: Self-pay | Admitting: Acute Care

## 2024-03-31 ENCOUNTER — Other Ambulatory Visit: Payer: Self-pay | Admitting: Acute Care

## 2024-03-31 DIAGNOSIS — Z122 Encounter for screening for malignant neoplasm of respiratory organs: Secondary | ICD-10-CM

## 2024-03-31 DIAGNOSIS — Z87891 Personal history of nicotine dependence: Secondary | ICD-10-CM

## 2024-03-31 DIAGNOSIS — F17210 Nicotine dependence, cigarettes, uncomplicated: Secondary | ICD-10-CM

## 2024-04-14 DIAGNOSIS — I4891 Unspecified atrial fibrillation: Secondary | ICD-10-CM | POA: Diagnosis not present

## 2024-04-14 DIAGNOSIS — I5022 Chronic systolic (congestive) heart failure: Secondary | ICD-10-CM | POA: Diagnosis not present

## 2024-04-20 DIAGNOSIS — N1832 Chronic kidney disease, stage 3b: Secondary | ICD-10-CM | POA: Diagnosis not present

## 2024-04-20 DIAGNOSIS — N2581 Secondary hyperparathyroidism of renal origin: Secondary | ICD-10-CM | POA: Diagnosis not present

## 2024-04-20 DIAGNOSIS — R809 Proteinuria, unspecified: Secondary | ICD-10-CM | POA: Diagnosis not present

## 2024-04-20 DIAGNOSIS — I5022 Chronic systolic (congestive) heart failure: Secondary | ICD-10-CM | POA: Diagnosis not present

## 2024-05-05 ENCOUNTER — Ambulatory Visit
Admission: RE | Admit: 2024-05-05 | Discharge: 2024-05-05 | Disposition: A | Discharge status: Home / Self Care | Source: Ambulatory Visit | Attending: Acute Care | Admitting: Acute Care

## 2024-05-05 DIAGNOSIS — Z122 Encounter for screening for malignant neoplasm of respiratory organs: Secondary | ICD-10-CM | POA: Diagnosis not present

## 2024-05-05 DIAGNOSIS — Z87891 Personal history of nicotine dependence: Secondary | ICD-10-CM | POA: Diagnosis not present

## 2024-05-05 DIAGNOSIS — F17210 Nicotine dependence, cigarettes, uncomplicated: Secondary | ICD-10-CM | POA: Insufficient documentation

## 2024-05-12 ENCOUNTER — Other Ambulatory Visit: Payer: Self-pay | Admitting: Family Medicine

## 2024-05-12 DIAGNOSIS — J301 Allergic rhinitis due to pollen: Secondary | ICD-10-CM

## 2024-05-19 ENCOUNTER — Other Ambulatory Visit: Payer: Self-pay | Admitting: Acute Care

## 2024-05-19 DIAGNOSIS — Z122 Encounter for screening for malignant neoplasm of respiratory organs: Secondary | ICD-10-CM

## 2024-05-19 DIAGNOSIS — I4891 Unspecified atrial fibrillation: Secondary | ICD-10-CM | POA: Diagnosis not present

## 2024-05-19 DIAGNOSIS — Z87891 Personal history of nicotine dependence: Secondary | ICD-10-CM

## 2024-05-19 DIAGNOSIS — F17210 Nicotine dependence, cigarettes, uncomplicated: Secondary | ICD-10-CM

## 2024-05-26 DIAGNOSIS — I5022 Chronic systolic (congestive) heart failure: Secondary | ICD-10-CM | POA: Diagnosis not present

## 2024-06-03 DIAGNOSIS — Z95810 Presence of automatic (implantable) cardiac defibrillator: Secondary | ICD-10-CM | POA: Diagnosis not present

## 2024-06-03 DIAGNOSIS — Z48812 Encounter for surgical aftercare following surgery on the circulatory system: Secondary | ICD-10-CM | POA: Diagnosis not present

## 2024-06-04 DIAGNOSIS — Z95810 Presence of automatic (implantable) cardiac defibrillator: Secondary | ICD-10-CM | POA: Diagnosis not present

## 2024-06-04 DIAGNOSIS — I428 Other cardiomyopathies: Secondary | ICD-10-CM | POA: Diagnosis not present

## 2024-06-04 DIAGNOSIS — J439 Emphysema, unspecified: Secondary | ICD-10-CM | POA: Diagnosis not present

## 2024-06-04 DIAGNOSIS — I5022 Chronic systolic (congestive) heart failure: Secondary | ICD-10-CM | POA: Diagnosis not present

## 2024-06-04 DIAGNOSIS — I083 Combined rheumatic disorders of mitral, aortic and tricuspid valves: Secondary | ICD-10-CM | POA: Diagnosis not present

## 2024-06-04 DIAGNOSIS — J9811 Atelectasis: Secondary | ICD-10-CM | POA: Diagnosis not present

## 2024-06-04 DIAGNOSIS — I4891 Unspecified atrial fibrillation: Secondary | ICD-10-CM | POA: Diagnosis not present

## 2024-06-18 DIAGNOSIS — H25813 Combined forms of age-related cataract, bilateral: Secondary | ICD-10-CM | POA: Diagnosis not present

## 2024-06-24 NOTE — Progress Notes (Signed)
 Established Patient Visit   Chief Complaint: Chief Complaint  Patient presents with  . Follow-up   Date of Service: 06/25/2024 Date of Birth: 10-Sep-1956 PCP: Dylan Nancyann BRAVO, MD  History of Present Illness: [name] is a 68 y.o.male patient who presents for a 3 month follow up. PMH significant for cardiomyopathy, CAD, PAF, AICD, CHF, COPD, SVT, OSA, hyperlipidemia, hypertension, CKD.  Today, pt presents with some recent SOB. His most recent catheterization was in 2016. He endorsed wearing a mask for sleep apnea. Encouraged to regularly exercise. He quit smoking around November 2024. Start spironolactone . Start farxiga . Will set up [facility] for further assessment of CHF and CM. Refer to HF for further assessment.  Visit Summaries: 03/10/2024 Patient was seen by me for a follow up. Order ABI of lower legs for further assessment.  Pacemaker defibrillator interrogation suggest heavy A-fib burden which may be contributing to the patient's overall condition we will consider refer to EP.  Past Medical and Surgical History  Past Medical History Past Medical History:  Diagnosis Date  . A-fib (CMS/HHS-HCC)   . Allergic rhinitis due to allergen   . Ascending aortic aneurysm () 11/18/2023  . CAD (coronary artery disease)   . CHF (congestive heart failure) (CMS/HHS-HCC)   . CKD (chronic kidney disease) stage 3, GFR 30-59 ml/min (CMS-HCC)   . COPD (chronic obstructive pulmonary disease) (CMS/HHS-HCC)   . GERD (gastroesophageal reflux disease)   . Hepatitis C   . History of pneumonia   . Hyperlipidemia   . Hypertension   . OSA (obstructive sleep apnea)     Past Surgical History He has a past surgical history that includes Cardiac catheterization; cardiac defibrillator placement (2015); and Insert / replace / remove pacemaker.   Medications and Allergies  Current Medications  Current Outpatient Medications  Medication Sig Dispense Refill  . albuterol  (PROVENTIL ) 2.5 mg /3 mL (0.083 %)  nebulizer solution USE 1 VIAL IN NEBULIZER EVERY 6 HOURS AS NEEDED FOR WHEEZING 1080 mL 0  . albuterol  MDI, PROVENTIL , VENTOLIN , PROAIR , HFA 90 mcg/actuation inhaler INHALE 2 PUFFS BY MOUTH EVERY 6 HOURS AS NEEDED FOR WHEEZING 18 g 5  . AMIOdarone  (PACERONE ) 200 MG tablet Take 1 tablet by mouth once daily 90 tablet 3  . aspirin  81 MG EC tablet Take 81 mg by mouth once daily.    . budesonide -formoteroL  (SYMBICORT ) 160-4.5 mcg/actuation inhaler INHALE TWO PUFFS BY MOUTH TWICE DAILY    . ipratropium (ATROVENT  HFA) inhaler Inhale 2 inhalations into the lungs 4 (four) times daily 1 g 6  . metoprolol  SUCCinate (TOPROL -XL) 25 MG XL tablet Take 1 tablet by mouth once daily 90 tablet 0  . miscellaneous medical supply Misc Use 1 each once daily (Patient taking differently: Use 1 each once daily CPAP & Oxygen  at night) 1 each 3  . omeprazole (PRILOSEC) 20 MG DR capsule Take 20 mg by mouth once daily    . roflumilast (DALIRESP) 500 mcg tablet Take 1 tablet by mouth once daily 30 tablet 0  . rosuvastatin  (CRESTOR ) 40 MG tablet Take 40 mg by mouth once daily    . sacubitriL -valsartan  (ENTRESTO ) 97-103 mg tablet TAKE 1 TABLET BY MOUTH EVERY 12 HOURS 60 tablet 11  . TORsemide  (DEMADEX ) 20 MG tablet Take 2 tablets by mouth once daily 180 tablet 3  . venlafaxine  (EFFEXOR -XR) 150 MG XR capsule Take 150 mg by mouth once daily    . dapagliflozin  propanediol (FARXIGA ) 10 mg tablet Take 1 tablet (10 mg total) by mouth once  daily 30 tablet 2  . spironolactone  (ALDACTONE ) 25 MG tablet Take 0.5 tablets (12.5 mg total) by mouth once daily 45 tablet 1   No current facility-administered medications for this visit.    Allergies: Quinolones  Social and Family History  Social History  reports that he quit smoking about 10 months ago. His smoking use included cigars and cigarettes. He has a 45 pack-year smoking history. He has been exposed to tobacco smoke. He has never used smokeless tobacco. He reports that he does not  drink alcohol and does not use drugs.  Family History Family History  Problem Relation Name Age of Onset  . High blood pressure (Hypertension) Mother joann   . Arthritis Mother joann   . Heart disease Father harold   . Myocardial Infarction (Heart attack) Father harold 45  . Coronary Artery Disease (Blocked arteries around heart) Father harold   . High blood pressure (Hypertension) Father harold   . Diabetes Father helayne   . Arthritis Father helayne   . COPD Father helayne   . Emphysema Father harold   . Arthritis Maternal Grandmother    . Arthritis Maternal Grandfather    . Arthritis Paternal Grandmother    . Arthritis Paternal Grandfather      Review of Systems   Pertinent positives and negatives are mentioned above in HPI and all other systems are negative.  Physical Examination   Vitals:BP (!) 160/84   Pulse 59   Resp 16   Ht 177.8 cm (5' 10)   Wt 99.3 kg (219 lb)   SpO2 96%   BMI 31.42 kg/m  Ht:177.8 cm (5' 10) Wt:99.3 kg (219 lb) ADJ:Anib surface area is 2.21 meters squared. Body mass index is 31.42 kg/m.  HEENT: Pupils equally reactive to light and accomodation  Neck: Supple without thyromegaly, carotid pulses 2+ Lungs: clear to auscultation bilaterally; no wheezes, rales, rhonchi Heart: Regular rate and rhythm.  No gallops, murmurs or rub Abdomen: soft nontender, nondistended, with normal bowel sounds Extremities: no cyanosis, clubbing, or edema Peripheral Pulses: 2+ in all extremities, 2+ femoral pulses bilaterally Neurologic: Alert and oriented X3; speech intact; face symmetrical; moves all extremities well  Cardiovascular Studies:    Echocardiogram 2D complete: 02/01/2024 IMPRESSIONS   1. Entire study needs to be redone due to poor images. LVEF appears relatively preserved based on Definity  images. All valves are very poorly visualized.   FINDINGS   Left Ventricle: Entire study needs to be redone due to poor images. LVEF appears relatively preserved  based on Definity  images. All valves are very poorly visualized. Definity  contrast agent was given IV to delineate the left ventricular endocardial  borders.   Aortic Valve: Aortic valve peak gradient measures 10.5 mmHg.   NM Myocardial Perfusion SPECT multiple (stress and rest): 04/13/2020 FINDINGS: Regional wall motion: Regional wall motion suggest moderate to severely depressed overall left ventricular function with left ventricular enlargement The overall quality of the study is good.   Artifacts noted: no Left ventricular cavity: enlarged.   Perfusion Analysis:  SPECT images demonstrate homogeneous tracer distribution throughout the myocardium. Defect type : Normal     IMPRESSION: Abnormal myocardial perfusion scan because of left ventricular enlargement and severely depressed left ventricular function between 25 and 30% overall perfusion appeared to be normal no significant defects or evidence of redistribution identified conclusion moderate risk scan because of depressed left ventricular function no clear evidence of ischemia   Cardiac Catheterization:  03/02/2015 Narrative  Mid RCA lesion, 60% stenosed. The lesion  was not previously treated.  Dist LAD lesion, 70% stenosed. The lesion was not previously treated.  Prox Cx lesion, 25% stenosed.  moderate to severely depressed left ventricular function and ejection fraction between 30 and 35%  nonischemic cardiomyopathy  normal right-sided heart pressures  Medical therapy for nonischemic cardiomyopathy  Holter:  Cardiac CT Scan:  Cardiac MRI: 06/04/2024 SUMMARY Regadenoson  stress cardiac MRI with and without contrast was performed on a 1.5 T scanner to evaluate myocardial morphology, function, viability, ischemia, and assess a 68 y.o.male patient who history of cardiomyopathy atrial fibrillation dyspnea on exertion palpitations tachycardia obesity chronic systolic congestive heart failure AICD in place he had episode  of tachycardia and AICD did not recognize he has a single-lead VVI rate set at discharge and rates of 200 to recognize V-fib.    Cardiac MRI:   1. The left ventricle is mildly enlarged with normal wall thickness. Global systolic function is moderately reduced. The LV ejection fraction is 34%. There is global hypokinesis with akinesis of the basal to mid inferior and inferolateral walls.   2. The right ventricle is normal in cavity size, wall thickness, and systolic function. The RV lead inserts into the the apical RV side of the septum.    3. Both atria are normal in size.    4. The aortic valve is trileaflet in morphology. There is moderate aortic valve regurgitation (EROA = 0.38 cm2). There is mild mitral regurgitation. There is trivial tricuspid and pulmonic regurgitation. No valve stenosis.   5. Delayed enhancement imaging demonstrates hyperenhancement representing subendocardial myocardial infarctions (up to 50% transmural) in the basal to mid septal, inferior, and lateral walls in all 3 coronary distributions. There is a small region of hyperenhancement in the apical anterior wall.    6. Regadenoson  stress perfusion imaging is abnormal. There are perfusion defects in the areas of myocardial infarction. However, there does not appear to evidence of inducible myocardial ischemia.   7. There is no evidence of an intracardiac thrombus.   8. The pericardium is normal in thickness. There is no significant pericardial effusion.    Assessment   68 y.o. male with  1. Coronary artery disease involving native coronary artery of native heart without angina pectoris   2. Nonischemic cardiomyopathy (CMS/HHS-HCC)   3. Chronic systolic HF (heart failure) (CMS/HHS-HCC)   4. AICD (automatic cardioverter/defibrillator) present   5. PAF (paroxysmal atrial fibrillation) (CMS/HHS-HCC)   6. SVT (supraventricular tachycardia) (HHS-HCC)   7. Essential hypertension   8. Mixed hyperlipidemia   9.  OSA (obstructive sleep apnea)   10. Abdominal aortic aneurysm (AAA), unspecified part, unspecified whether ruptured ()   11. Chronic obstructive pulmonary disease, unspecified COPD type (CMS/HHS-HCC)   12. Stage 3a chronic kidney disease (CMS-HCC)   13. Tobacco use   14. Quit smoking    Plan   CAD, without angina, continue aspirin  and rosuvastatin   Cardiomyopathy, continue HF management CHF, maintained on Entresto , torsemide , start farxiga , spironolactone , set up [facility], refer to HF PAF, continue amiodarone  for rhythm control, currently not on anticoagulation  SVT, H/o SVT, continue current medical therapy. Recommend decreasing caffeine intake, avoiding the consumption of energy drinks, staying adequately hydrated with water and electrolytes daily and practicing healthy anxiety management  Hypertension, today's BP was 160/84, reasonably controlled Hyperlipidemia, continue Crestor  therapy for lipid management  OSA, use CPAP if indicated, recommend weight loss  AAA, stable, management per vascular  COPD, fairly stable, management per pulmonary  CKD, management per PCP  Smoking history, continue tobacco  cessation     Return in about 1 month (around 07/26/2024).  This note is partially written by Leita Ellen, in the presence of and acting as the scribe of Dr. Cara Lovelace.      Leita Ellen  I have reviewed, edited and added to the note to reflect my best personal medical judgment.  Attestation Statement:   I personally performed the service. (TP)  DWAYNE JONETTA LOVELACE, MD  [facility] [place], [place] Ph:  [phone] Fax:  [phone] This note was generated in part with voice recognition software, Dragon.  I apologize for any typographical errors that were not detected and corrected from this process.  They are unintentional.

## 2024-06-25 DIAGNOSIS — I428 Other cardiomyopathies: Secondary | ICD-10-CM | POA: Diagnosis not present

## 2024-06-25 DIAGNOSIS — I714 Abdominal aortic aneurysm, without rupture, unspecified: Secondary | ICD-10-CM | POA: Diagnosis not present

## 2024-06-25 DIAGNOSIS — G4733 Obstructive sleep apnea (adult) (pediatric): Secondary | ICD-10-CM | POA: Diagnosis not present

## 2024-06-25 DIAGNOSIS — I5022 Chronic systolic (congestive) heart failure: Secondary | ICD-10-CM | POA: Diagnosis not present

## 2024-06-25 DIAGNOSIS — I480 Paroxysmal atrial fibrillation: Secondary | ICD-10-CM | POA: Diagnosis not present

## 2024-06-25 DIAGNOSIS — E782 Mixed hyperlipidemia: Secondary | ICD-10-CM | POA: Diagnosis not present

## 2024-06-25 DIAGNOSIS — I2510 Atherosclerotic heart disease of native coronary artery without angina pectoris: Secondary | ICD-10-CM | POA: Diagnosis not present

## 2024-06-25 DIAGNOSIS — I10 Essential (primary) hypertension: Secondary | ICD-10-CM | POA: Diagnosis not present

## 2024-06-25 DIAGNOSIS — N1831 Chronic kidney disease, stage 3a: Secondary | ICD-10-CM | POA: Diagnosis not present

## 2024-06-25 DIAGNOSIS — J449 Chronic obstructive pulmonary disease, unspecified: Secondary | ICD-10-CM | POA: Diagnosis not present

## 2024-06-25 DIAGNOSIS — I471 Supraventricular tachycardia, unspecified: Secondary | ICD-10-CM | POA: Diagnosis not present

## 2024-06-25 DIAGNOSIS — Z95810 Presence of automatic (implantable) cardiac defibrillator: Secondary | ICD-10-CM | POA: Diagnosis not present

## 2024-07-02 ENCOUNTER — Encounter: Payer: Self-pay | Admitting: Internal Medicine

## 2024-07-02 ENCOUNTER — Ambulatory Visit
Admission: RE | Admit: 2024-07-02 | Discharge: 2024-07-02 | Disposition: A | Discharge status: Home / Self Care | Attending: Internal Medicine | Admitting: Internal Medicine

## 2024-07-02 ENCOUNTER — Encounter
Admission: RE | Disposition: A | Discharge status: Home / Self Care | Payer: Self-pay | Source: Home / Self Care | Attending: Internal Medicine

## 2024-07-02 ENCOUNTER — Other Ambulatory Visit: Payer: Self-pay

## 2024-07-02 DIAGNOSIS — Z87891 Personal history of nicotine dependence: Secondary | ICD-10-CM | POA: Insufficient documentation

## 2024-07-02 DIAGNOSIS — I2582 Chronic total occlusion of coronary artery: Secondary | ICD-10-CM | POA: Diagnosis not present

## 2024-07-02 DIAGNOSIS — I2510 Atherosclerotic heart disease of native coronary artery without angina pectoris: Secondary | ICD-10-CM | POA: Diagnosis not present

## 2024-07-02 DIAGNOSIS — Z7982 Long term (current) use of aspirin: Secondary | ICD-10-CM | POA: Insufficient documentation

## 2024-07-02 DIAGNOSIS — Z79899 Other long term (current) drug therapy: Secondary | ICD-10-CM | POA: Insufficient documentation

## 2024-07-02 DIAGNOSIS — N1831 Chronic kidney disease, stage 3a: Secondary | ICD-10-CM | POA: Insufficient documentation

## 2024-07-02 DIAGNOSIS — Z95810 Presence of automatic (implantable) cardiac defibrillator: Secondary | ICD-10-CM | POA: Diagnosis not present

## 2024-07-02 DIAGNOSIS — I130 Hypertensive heart and chronic kidney disease with heart failure and stage 1 through stage 4 chronic kidney disease, or unspecified chronic kidney disease: Secondary | ICD-10-CM | POA: Diagnosis not present

## 2024-07-02 DIAGNOSIS — I428 Other cardiomyopathies: Secondary | ICD-10-CM | POA: Diagnosis not present

## 2024-07-02 DIAGNOSIS — I5022 Chronic systolic (congestive) heart failure: Secondary | ICD-10-CM | POA: Insufficient documentation

## 2024-07-02 DIAGNOSIS — G4733 Obstructive sleep apnea (adult) (pediatric): Secondary | ICD-10-CM | POA: Diagnosis not present

## 2024-07-02 DIAGNOSIS — I471 Supraventricular tachycardia, unspecified: Secondary | ICD-10-CM | POA: Diagnosis not present

## 2024-07-02 DIAGNOSIS — E782 Mixed hyperlipidemia: Secondary | ICD-10-CM | POA: Diagnosis not present

## 2024-07-02 HISTORY — PX: LEFT HEART CATH AND CORONARY ANGIOGRAPHY: CATH118249

## 2024-07-02 SURGERY — LEFT HEART CATH AND CORONARY ANGIOGRAPHY
Anesthesia: Moderate Sedation | Laterality: Left

## 2024-07-02 MED ORDER — SODIUM CHLORIDE 0.9% FLUSH: 3.0000 mL | Freq: Two times a day (BID) | INTRAVENOUS | Status: DC

## 2024-07-02 MED ORDER — HEPARIN SODIUM (PORCINE) 1000 UNIT/ML IJ SOLN
INTRAMUSCULAR | Status: DC | PRN
  Administered 2024-07-02: 4000 [IU] via INTRAVENOUS

## 2024-07-02 MED ORDER — VERAPAMIL HCL 2.5 MG/ML IV SOLN
INTRAVENOUS | Status: DC | PRN
  Administered 2024-07-02: 2.5 mg via INTRAVENOUS

## 2024-07-02 MED ORDER — MIDAZOLAM HCL 2 MG/2ML IJ SOLN
INTRAMUSCULAR | Status: AC
  Filled 2024-07-02: qty 2

## 2024-07-02 MED ORDER — LIDOCAINE HCL (PF) 1 % IJ SOLN
INTRAMUSCULAR | Status: DC | PRN
  Administered 2024-07-02: 2 mL

## 2024-07-02 MED ORDER — MIDAZOLAM HCL (PF) 2 MG/2ML IJ SOLN
INTRAMUSCULAR | Status: DC | PRN
  Administered 2024-07-02: 1 mg via INTRAVENOUS

## 2024-07-02 MED ORDER — FREE WATER
250.0000 mL | Freq: Once | Status: AC
  Administered 2024-07-02: 250 mL via ORAL

## 2024-07-02 MED ORDER — ONDANSETRON HCL 4 MG/2ML IJ SOLN: 4.0000 mg | Freq: Four times a day (QID) | INTRAMUSCULAR | Status: DC | PRN

## 2024-07-02 MED ORDER — SODIUM CHLORIDE 0.9% FLUSH: 3.0000 mL | INTRAVENOUS | Status: DC | PRN

## 2024-07-02 MED ORDER — SODIUM CHLORIDE 0.9 % IV SOLN
250.0000 mL | INTRAVENOUS | Status: DC | PRN
  Administered 2024-07-02: 250 mL via INTRAVENOUS

## 2024-07-02 MED ORDER — LIDOCAINE HCL 1 % IJ SOLN
INTRAMUSCULAR | Status: AC
  Filled 2024-07-02: qty 20

## 2024-07-02 MED ORDER — HYDRALAZINE HCL 20 MG/ML IJ SOLN: 10.0000 mg | INTRAMUSCULAR | Status: DC | PRN

## 2024-07-02 MED ORDER — FENTANYL CITRATE (PF) 100 MCG/2ML IJ SOLN
INTRAMUSCULAR | Status: AC
  Filled 2024-07-02: qty 2

## 2024-07-02 MED ORDER — IOHEXOL 300 MG/ML  SOLN
INTRAMUSCULAR | Status: DC | PRN
  Administered 2024-07-02: 70 mL

## 2024-07-02 MED ORDER — HEPARIN (PORCINE) IN NACL 1000-0.9 UT/500ML-% IV SOLN
INTRAVENOUS | Status: AC
  Filled 2024-07-02: qty 1000

## 2024-07-02 MED ORDER — ASPIRIN 81 MG PO CHEW
81.0000 mg | CHEWABLE_TABLET | ORAL | Status: AC
  Administered 2024-07-02: 81 mg via ORAL

## 2024-07-02 MED ORDER — SODIUM CHLORIDE 0.9 % IV SOLN: 250.0000 mL | INTRAVENOUS | Status: DC | PRN

## 2024-07-02 MED ORDER — ACETAMINOPHEN 325 MG PO TABS: 650.0000 mg | ORAL_TABLET | ORAL | Status: DC | PRN

## 2024-07-02 MED ORDER — FENTANYL CITRATE (PF) 100 MCG/2ML IJ SOLN
INTRAMUSCULAR | Status: DC | PRN
  Administered 2024-07-02: 25 ug via INTRAVENOUS

## 2024-07-02 MED ORDER — VERAPAMIL HCL 2.5 MG/ML IV SOLN
INTRAVENOUS | Status: AC
  Filled 2024-07-02: qty 2

## 2024-07-02 MED ORDER — HEPARIN SODIUM (PORCINE) 1000 UNIT/ML IJ SOLN
INTRAMUSCULAR | Status: AC
  Filled 2024-07-02: qty 10

## 2024-07-02 MED ORDER — FREE WATER: 250.0000 mL | Freq: Once | Status: DC

## 2024-07-02 MED ORDER — HEPARIN (PORCINE) IN NACL 1000-0.9 UT/500ML-% IV SOLN
INTRAVENOUS | Status: DC | PRN
  Administered 2024-07-02: 1000 mL

## 2024-07-02 MED ORDER — ASPIRIN 81 MG PO CHEW
CHEWABLE_TABLET | ORAL | Status: AC
  Filled 2024-07-02: qty 1

## 2024-07-02 SURGICAL SUPPLY — 10 items
CATH INFINITI 5 FR JL3.5 (CATHETERS) IMPLANT
CATH INFINITI 5FR JL4 (CATHETERS) IMPLANT
CATH INFINITI JR4 5F (CATHETERS) IMPLANT
DEVICE RAD TR BAND REGULAR (VASCULAR PRODUCTS) IMPLANT
DRAPE BRACHIAL (DRAPES) IMPLANT
GLIDESHEATH SLEND SS 6F .021 (SHEATH) IMPLANT
GUIDEWIRE INQWIRE 1.5J.035X260 (WIRE) IMPLANT
PACK CARDIAC CATH (CUSTOM PROCEDURE TRAY) ×1 IMPLANT
SET ATX-X65L (MISCELLANEOUS) IMPLANT
STATION PROTECTION PRESSURIZED (MISCELLANEOUS) IMPLANT

## 2024-07-04 LAB — CARDIAC CATHETERIZATION: Cath EF Quantitative: 30 %

## 2024-07-06 ENCOUNTER — Encounter: Payer: Self-pay | Admitting: Internal Medicine

## 2024-07-07 DIAGNOSIS — I25118 Atherosclerotic heart disease of native coronary artery with other forms of angina pectoris: Secondary | ICD-10-CM | POA: Diagnosis not present

## 2024-07-07 DIAGNOSIS — E785 Hyperlipidemia, unspecified: Secondary | ICD-10-CM | POA: Diagnosis not present

## 2024-07-07 DIAGNOSIS — Z95810 Presence of automatic (implantable) cardiac defibrillator: Secondary | ICD-10-CM | POA: Diagnosis not present

## 2024-07-07 DIAGNOSIS — N1831 Chronic kidney disease, stage 3a: Secondary | ICD-10-CM | POA: Diagnosis not present

## 2024-07-07 DIAGNOSIS — I429 Cardiomyopathy, unspecified: Secondary | ICD-10-CM | POA: Diagnosis not present

## 2024-07-07 DIAGNOSIS — Z87891 Personal history of nicotine dependence: Secondary | ICD-10-CM | POA: Diagnosis not present

## 2024-07-07 DIAGNOSIS — I428 Other cardiomyopathies: Secondary | ICD-10-CM | POA: Diagnosis not present

## 2024-07-07 DIAGNOSIS — R0602 Shortness of breath: Secondary | ICD-10-CM | POA: Diagnosis not present

## 2024-07-07 DIAGNOSIS — I130 Hypertensive heart and chronic kidney disease with heart failure and stage 1 through stage 4 chronic kidney disease, or unspecified chronic kidney disease: Secondary | ICD-10-CM | POA: Diagnosis not present

## 2024-07-07 DIAGNOSIS — I5022 Chronic systolic (congestive) heart failure: Secondary | ICD-10-CM | POA: Diagnosis not present

## 2024-07-07 DIAGNOSIS — R9431 Abnormal electrocardiogram [ECG] [EKG]: Secondary | ICD-10-CM | POA: Diagnosis not present

## 2024-07-07 DIAGNOSIS — I480 Paroxysmal atrial fibrillation: Secondary | ICD-10-CM | POA: Diagnosis not present

## 2024-07-07 DIAGNOSIS — I471 Supraventricular tachycardia, unspecified: Secondary | ICD-10-CM | POA: Diagnosis not present

## 2024-07-07 DIAGNOSIS — J449 Chronic obstructive pulmonary disease, unspecified: Secondary | ICD-10-CM | POA: Diagnosis not present

## 2024-07-07 DIAGNOSIS — G4733 Obstructive sleep apnea (adult) (pediatric): Secondary | ICD-10-CM | POA: Diagnosis not present

## 2024-07-07 DIAGNOSIS — I2542 Coronary artery dissection: Secondary | ICD-10-CM | POA: Diagnosis not present

## 2024-07-11 ENCOUNTER — Other Ambulatory Visit: Payer: Self-pay | Admitting: Family Medicine

## 2024-07-11 DIAGNOSIS — J301 Allergic rhinitis due to pollen: Secondary | ICD-10-CM

## 2024-07-13 ENCOUNTER — Ambulatory Visit: Admitting: Family Medicine

## 2024-07-24 ENCOUNTER — Ambulatory Visit (INDEPENDENT_AMBULATORY_CARE_PROVIDER_SITE_OTHER): Admitting: Family Medicine

## 2024-07-24 ENCOUNTER — Encounter: Payer: Self-pay | Admitting: Family Medicine

## 2024-07-24 VITALS — BP 114/80 | HR 69 | Ht 70.0 in | Wt 215.0 lb

## 2024-07-24 DIAGNOSIS — Z23 Encounter for immunization: Secondary | ICD-10-CM | POA: Diagnosis not present

## 2024-07-24 DIAGNOSIS — I10 Essential (primary) hypertension: Secondary | ICD-10-CM | POA: Diagnosis not present

## 2024-07-24 DIAGNOSIS — I5022 Chronic systolic (congestive) heart failure: Secondary | ICD-10-CM

## 2024-07-24 DIAGNOSIS — F321 Major depressive disorder, single episode, moderate: Secondary | ICD-10-CM

## 2024-07-24 DIAGNOSIS — N1832 Chronic kidney disease, stage 3b: Secondary | ICD-10-CM | POA: Diagnosis not present

## 2024-07-24 DIAGNOSIS — I2510 Atherosclerotic heart disease of native coronary artery without angina pectoris: Secondary | ICD-10-CM

## 2024-07-24 DIAGNOSIS — E785 Hyperlipidemia, unspecified: Secondary | ICD-10-CM

## 2024-07-24 DIAGNOSIS — J449 Chronic obstructive pulmonary disease, unspecified: Secondary | ICD-10-CM

## 2024-07-24 NOTE — Progress Notes (Signed)
 Established patient visit   Patient: Dylan Lee   DOB: [birth year]   68 y.o. Male  MRN: [id number] Visit Date: 07/24/2024  Today's healthcare provider: Nancyann Perry, MD   Chief Complaint  Patient presents with   Medical Management of Chronic Issues    Follow up Hypertension   Subjective    Discussed the use of AI scribe software for clinical note transcription with the patient, who gave verbal consent to proceed.  History of Present Illness   Dylan Lee is a 68 year old male with hypertension, depression, hyperlipidemia, CHF, chronic obstructive pulmonary disease (COPD) who presents for a follow-up visit.  His breathing has been stable, though he still experiences shortness of breath. He uses Atrovent  and albuterol , and is also on Daliresp. Since quitting smoking last Thanksgiving, he has reduced his medication use, which has significantly improved his breathing.  He has a history of kidney issues and sees a nephrology approximately every six months. His last visit to the kidney specialist was in August.  He is currently taking Effexor  for major depressive disorder and reports that his mood has been good. He is unsure about his current use of Singulair  for allergies but recalls a recent refill. He was restarted on Jardiance and is taking Entresto . His diuretic medication has been adjusted to every other day.     Lab Results  Component Value Date   CHOL 126 02/01/2024   HDL 36 (L) 02/01/2024   LDLCALC 69 02/01/2024   TRIG 106 02/01/2024   CHOLHDL 3.5 02/01/2024   Lab Results  Component Value Date   NA 139 02/02/2024   K 4.0 02/02/2024   CREATININE 1.73 (H) 02/02/2024   GFRNONAA 42 (L) 02/02/2024   GLUCOSE 103 (H) 02/02/2024      Medications: Outpatient Medications Prior to Visit  Medication Sig   albuterol  (VENTOLIN  HFA) 108 (90 Base) MCG/ACT inhaler Inhale 2 puffs into the lungs every 6 (six) hours as needed for wheezing or shortness of breath.    amiodarone  (PACERONE ) 200 MG tablet Take 200 mg by mouth daily.   aspirin  81 MG tablet Take 81 mg by mouth daily.   diphenoxylate -atropine  (LOMOTIL ) 2.5-0.025 MG tablet Take 2 tablets by mouth 2 (two) times daily as needed for diarrhea or loose stools.   ipratropium (ATROVENT  HFA) 17 MCG/ACT inhaler Inhale 2 puffs into the lungs every 8 (eight) hours.   metoprolol  succinate (TOPROL -XL) 25 MG 24 hr tablet Take 1 tablet (25 mg total) by mouth daily.   montelukast  (SINGULAIR ) 10 MG tablet Take 1 tablet (10 mg total) by mouth daily.   omeprazole (PRILOSEC) 20 MG capsule Take 20 mg by mouth at bedtime.   OXYGEN  Inhale 2 L into the lungs every evening. Via cpap   roflumilast (DALIRESP) 500 MCG TABS tablet Take 500 mcg by mouth daily.   rosuvastatin  (CRESTOR ) 40 MG tablet Take 1 tablet by mouth once daily (Patient taking differently: Take 40 mg by mouth every evening.)   sacubitril -valsartan  (ENTRESTO ) 97-103 MG Take 1 tablet by mouth 2 (two) times daily.   torsemide  (DEMADEX ) 20 MG tablet Take 40 mg by mouth daily.   venlafaxine  XR (EFFEXOR -XR) 150 MG 24 hr capsule TAKE 1 CAPSULE BY MOUTH ONCE DAILY WITH 75MG  DAILY   Farxiga  10 MG TABS tablet Take 10 mg by mouth daily.   [Paused] spironolactone  (ALDACTONE ) 25 MG tablet Take 12.5 mg by mouth daily. Half tablet (Patient taking differently: Take 12.5 mg by mouth daily.  Half tablet)   No facility-administered medications prior to visit.   Review of Systems  Constitutional:  Negative for appetite change, chills and fever.  Respiratory:  Negative for chest tightness, shortness of breath and wheezing.   Cardiovascular:  Negative for chest pain and palpitations.  Gastrointestinal:  Negative for abdominal pain, nausea and vomiting.   Patient Care Team: Gasper Nancyann BRAVO, MD as PCP - General (Family Medicine) Florencio Cara BIRCH, MD as Consulting Physician (Cardiology) Sharlot Agent, OD (Optometry) Lateef, Munsoor, MD (Nephrology) Tobie Lady POUR, MD as  Consulting Physician (Rheumatology) Theotis Lavelle BRAVO, MD as Referring Physician (Pulmonary Disease)      Objective    BP 114/80 (BP Location: Right Arm, Patient Position: Sitting, Cuff Size: Normal)   Pulse 69   Ht 5' 10 (1.778 m)   Wt 215 lb (97.5 kg)   SpO2 95%   BMI 30.85 kg/m   Physical Exam   General: Appearance:    Overweight male in no acute distress  Eyes:    PERRL, conjunctiva/corneas clear, EOM's intact       Lungs:     Clear to auscultation bilaterally, respirations unlabored  Heart:    Normal heart rate. Normal rhythm. No murmurs, rubs, or gallops.    MS:   All extremities are intact.    Neurologic:   Awake, alert, oriented x 3. No apparent focal neurological defect.         Assessment & Plan    1. Primary hypertension (Primary) Very well on controlled on cocktail of cardiac medications.   2. Depression, major, single episode, moderate (HCC) Well controlled on current dose venlafaxine .   3. Coronary artery disease involving native coronary artery of native heart without angina pectoris Asymptomatic. Compliant with medication.  Continue aggressive risk factor modification.    4. Chronic systolic heart failure (HCC) Well compensated on ARNI, betablocker, SGLT-2 blocker and MCRA. Continue regular cardiology follow up   5. Chronic obstructive pulmonary disease, unspecified COPD type (HCC) Well controlled on current regiment. Continue follow up Dr. Theotis.   6. Dyslipidemia (high LDL; low HDL) He is tolerating rosuvastatin  well with no adverse effects.    7. Chronic kidney disease, stage 3b (HCC) Stable on current medication regiment. Continue routine follow up nephrology.   Other orders - Flu vaccine HIGH DOSE PF(Fluzone Trivalent)      Nancyann Gasper, MD  [facility] [phone] (phone) [phone] (fax)  [facility]

## 2024-07-24 NOTE — Patient Instructions (Signed)
 Dylan Lee  Please review the attached list of medications and notify my office if there are any errors.   . Please bring all of your medications to every appointment so we can make sure that our medication list is the same as yours.

## 2024-08-21 ENCOUNTER — Encounter

## 2024-08-21 DIAGNOSIS — I5022 Chronic systolic (congestive) heart failure: Secondary | ICD-10-CM | POA: Insufficient documentation

## 2024-08-21 NOTE — Progress Notes (Signed)
 Initial phone call completed. Diagnosis can be found in [facility] 11/20. EP Orientation scheduled for Monday 12/8 at 9am.

## 2024-08-24 ENCOUNTER — Encounter

## 2024-08-27 ENCOUNTER — Encounter

## 2024-08-27 VITALS — Ht 69.1 in | Wt 213.8 lb

## 2024-08-27 DIAGNOSIS — I5022 Chronic systolic (congestive) heart failure: Secondary | ICD-10-CM | POA: Diagnosis present

## 2024-08-27 NOTE — Patient Instructions (Signed)
 Patient Instructions  Patient Details  Name: Dylan Lee MRN: [id number] Date of Birth: [birth year] Referring Provider:  Florencio Cara BIRCH, MD  Below are your personal goals for exercise, nutrition, and risk factors. Our goal is to help you stay on track towards obtaining and maintaining these goals. We will be discussing your progress on these goals with you throughout the program.  Initial Exercise Prescription:  Initial Exercise Prescription - 08/27/24 1600       Date of Initial Exercise RX and Referring Provider   Date 08/27/24    Referring Provider Florencio Cara, MD      Oxygen    Maintain Oxygen  Saturation 88% or higher      Treadmill   MPH 0.6    Grade 0    Minutes 15    METs 1.1      Recumbant Bike   Level 1    RPM 50    Watts 15    Minutes 15    METs 1.1      NuStep   Level 1   T4 and T6   SPM 80    Minutes 15    METs 1.1      T5 Nustep   Level 1    SPM 80    Minutes 15    METs 1.1      Biostep-RELP   Level 1    SPM 50    Minutes 15    METs 1.1      Track   Laps 9    Minutes 15    METs 1.49      Prescription Details   Frequency (times per week) 3    Duration Progress to 30 minutes of continuous aerobic without signs/symptoms of physical distress      Intensity   THRR 40-80% of Max Heartrate 97-133    Ratings of Perceived Exertion 11-13    Perceived Dyspnea 0-4      Progression   Progression Continue to progress workloads to maintain intensity without signs/symptoms of physical distress.      Resistance Training   Training Prescription Yes    Weight 5 lb    Reps 10-15          Exercise Goals: Frequency: Be able to perform aerobic exercise two to three times per week in program working toward 2-5 days per week of home exercise.  Intensity: Work with a perceived exertion of 11 (fairly light) - 15 (hard) while following your exercise prescription.  We will make changes to your prescription with you as you progress through the  program.   Duration: Be able to do 30 to 45 minutes of continuous aerobic exercise in addition to a 5 minute warm-up and a 5 minute cool-down routine.   Nutrition Goals: Your personal nutrition goals will be established when you do your nutrition analysis with the dietician.  The following are general nutrition guidelines to follow: Cholesterol < 200mg /day Sodium < 1500mg /day Fiber: Men over 50 yrs - 30 grams per day  Personal Goals:  Personal Goals and Risk Factors at Admission - 08/27/24 1649       Core Components/Risk Factors/Patient Goals on Admission    Weight Management Yes;Weight Maintenance    Intervention Weight Management: Develop a combined nutrition and exercise program designed to reach desired caloric intake, while maintaining appropriate intake of nutrient and fiber, sodium and fats, and appropriate energy expenditure required for the weight goal.;Weight Management: Provide education and appropriate resources to help participant work on  and attain dietary goals.;Weight Management/Obesity: Establish reasonable short term and long term weight goals.    Admit Weight 213 lb 12.8 oz (97 kg)    Goal Weight: Short Term 213 lb 12.8 oz (97 kg)    Goal Weight: Long Term 213 lb 12.8 oz (97 kg)    Expected Outcomes Short Term: Continue to assess and modify interventions until short term weight is achieved;Long Term: Adherence to nutrition and physical activity/exercise program aimed toward attainment of established weight goal;Weight Maintenance: Understanding of the daily nutrition guidelines, which includes 25-35% calories from fat, 7% or less cal from saturated fats, less than 200mg  cholesterol, less than 1.5gm of sodium, & 5 or more servings of fruits and vegetables daily;Understanding recommendations for meals to include 15-35% energy as protein, 25-35% energy from fat, 35-60% energy from carbohydrates, less than 200mg  of dietary cholesterol, 20-35 gm of total fiber daily;Understanding  of distribution of calorie intake throughout the day with the consumption of 4-5 meals/snacks    Heart Failure Yes    Intervention Provide a combined exercise and nutrition program that is supplemented with education, support and counseling about heart failure. Directed toward relieving symptoms such as shortness of breath, decreased exercise tolerance, and extremity edema.    Expected Outcomes Improve functional capacity of life;Short term: Daily weights obtained and reported for increase. Utilizing diuretic protocols set by physician.;Short term: Attendance in program 2-3 days a week with increased exercise capacity. Reported lower sodium intake. Reported increased fruit and vegetable intake. Reports medication compliance.;Long term: Adoption of self-care skills and reduction of barriers for early signs and symptoms recognition and intervention leading to self-care maintenance.    Hypertension Yes    Intervention Provide education on lifestyle modifcations including regular physical activity/exercise, weight management, moderate sodium restriction and increased consumption of fresh fruit, vegetables, and low fat dairy, alcohol moderation, and smoking cessation.;Monitor prescription use compliance.    Expected Outcomes Short Term: Continued assessment and intervention until BP is < 140/66mm HG in hypertensive participants. < 130/50mm HG in hypertensive participants with diabetes, heart failure or chronic kidney disease.;Long Term: Maintenance of blood pressure at goal levels.    Lipids Yes    Intervention Provide education and support for participant on nutrition & aerobic/resistive exercise along with prescribed medications to achieve LDL 70mg , HDL >40mg .    Expected Outcomes Short Term: Participant states understanding of desired cholesterol values and is compliant with medications prescribed. Participant is following exercise prescription and nutrition guidelines.;Long Term: Cholesterol controlled with  medications as prescribed, with individualized exercise RX and with personalized nutrition plan. Value goals: LDL < 70mg , HDL > 40 mg.          Tobacco Use Initial Evaluation: Social History   Tobacco Use  Smoking Status Former   Current packs/day: 0.00   Average packs/day: 1 pack/day for 41.0 years (41.0 ttl pk-yrs)   Types: Cigars, Cigarettes   Quit date: 07/2023   Years since quitting: 1.1  Smokeless Tobacco Never  Tobacco Comments   smokes 1-2 cigars daily; quit cigs. 30+ years ago    Exercise Goals and Review:  Exercise Goals     Row Name 08/27/24 1648             Exercise Goals   Increase Physical Activity Yes       Intervention Provide advice, education, support and counseling about physical activity/exercise needs.;Develop an individualized exercise prescription for aerobic and resistive training based on initial evaluation findings, risk stratification, comorbidities and participant's personal goals.  Expected Outcomes Long Term: Add in home exercise to make exercise part of routine and to increase amount of physical activity.;Short Term: Attend rehab on a regular basis to increase amount of physical activity.;Long Term: Exercising regularly at least 3-5 days a week.       Increase Strength and Stamina Yes       Intervention Provide advice, education, support and counseling about physical activity/exercise needs.;Develop an individualized exercise prescription for aerobic and resistive training based on initial evaluation findings, risk stratification, comorbidities and participant's personal goals.       Expected Outcomes Short Term: Increase workloads from initial exercise prescription for resistance, speed, and METs.;Short Term: Perform resistance training exercises routinely during rehab and add in resistance training at home;Long Term: Improve cardiorespiratory fitness, muscular endurance and strength as measured by increased METs and functional capacity ( )        Able to understand and use rate of perceived exertion (RPE) scale Yes       Intervention Provide education and explanation on how to use RPE scale       Expected Outcomes Short Term: Able to use RPE daily in rehab to express subjective intensity level;Long Term:  Able to use RPE to guide intensity level when exercising independently       Able to understand and use Dyspnea scale Yes       Intervention Provide education and explanation on how to use Dyspnea scale       Expected Outcomes Short Term: Able to use Dyspnea scale daily in rehab to express subjective sense of shortness of breath during exertion;Long Term: Able to use Dyspnea scale to guide intensity level when exercising independently       Knowledge and understanding of Target Heart Rate Range (THRR) Yes       Intervention Provide education and explanation of THRR including how the numbers were predicted and where they are located for reference       Expected Outcomes Short Term: Able to state/look up THRR;Short Term: Able to use daily as guideline for intensity in rehab;Long Term: Able to use THRR to govern intensity when exercising independently       Able to check pulse independently Yes       Intervention Provide education and demonstration on how to check pulse in carotid and radial arteries.;Review the importance of being able to check your own pulse for safety during independent exercise       Expected Outcomes Short Term: Able to explain why pulse checking is important during independent exercise;Long Term: Able to check pulse independently and accurately       Understanding of Exercise Prescription Yes       Intervention Provide education, explanation, and written materials on patient's individual exercise prescription       Expected Outcomes Short Term: Able to explain program exercise prescription;Long Term: Able to explain home exercise prescription to exercise independently

## 2024-08-27 NOTE — Progress Notes (Cosign Needed Addendum)
 Cardiac Individual Treatment Plan  Patient Details  Name: Dylan Lee MRN: [id number] Date of Birth: 08/25/1956 Referring Provider:   Flowsheet Row Cardiac Rehab from 08/27/2024 in [facility] Cardiac and Pulmonary Rehab  Referring Provider Florencio Kava, MD    Initial Encounter Date:  Flowsheet Row Cardiac Rehab from 08/27/2024 in [facility] Cardiac and Pulmonary Rehab  Date 08/27/24    Visit Diagnosis: Heart failure, chronic systolic (HCC)  Patient's Home Medications on Admission: Current Medications[1]  Past Medical History: Past Medical History:  Diagnosis Date   Arthritis    CHF (congestive heart failure) (HCC)    COPD (chronic obstructive pulmonary disease) (HCC)    Depression    Encephalopathy due to COVID-19 virus 09/07/2021   GERD (gastroesophageal reflux disease)    Hepatitis C    Hep C treated    History of chicken pox    History of measles    History of mumps    Hyperlipidemia    Hypertension    Kidney stones    Oxygen  deficiency    Pneumonia due to COVID-19 virus 09/07/2021   Shingles 09/06/2015   Sleep apnea     Tobacco Use: Tobacco Use History[2]  Labs: Review Flowsheet  More data exists      Latest Ref Rng & Units 10/06/2021 05/07/2022 01/08/2023 07/10/2023 02/01/2024  Labs for ITP Cardiac and Pulmonary Rehab  Cholestrol 0 - 200 mg/dL - 848  - 869  873   LDL (calc) 0 - 99 mg/dL - 91  - 70  69   HDL-C >40 mg/dL - 33  - 31  36   Trlycerides <150 mg/dL - 849  - 827  893   Hemoglobin A1c 4.8 - 5.6 % 6.2  5.9  5.5  5.7  -     Exercise Target Goals: Exercise Program Goal: Individual exercise prescription set using results from initial 6 min walk test and THRR while considering  patients activity barriers and safety.   Exercise Prescription Goal: Initial exercise prescription builds to 30-45 minutes a day of aerobic activity, 2-3 days per week.  Home exercise guidelines will be given to patient during program as part of exercise prescription that the  participant will acknowledge.   Education: Aerobic Exercise: - Group verbal and visual presentation on the components of exercise prescription. Introduces F.I.T.T principle from ACSM for exercise prescriptions.  Reviews F.I.T.T. principles of aerobic exercise including progression. Written material provided at class time. Flowsheet Row Cardiac Rehab from 08/27/2024 in [facility] Cardiac and Pulmonary Rehab  Education need identified 08/27/24    Education: Resistance Exercise: - Group verbal and visual presentation on the components of exercise prescription. Introduces F.I.T.T principle from ACSM for exercise prescriptions  Reviews F.I.T.T. principles of resistance exercise including progression. Written material provided at class time. Flowsheet Row Cardiac Rehab from 08/27/2024 in [facility] Cardiac and Pulmonary Rehab  Education need identified 08/27/24     Education: Exercise & Equipment Safety: - Individual verbal instruction and demonstration of equipment use and safety with use of the equipment. Flowsheet Row Cardiac Rehab from 08/27/2024 in [facility] Cardiac and Pulmonary Rehab  Date 08/27/24  Educator MB  Instruction Review Code 1- Verbalizes Understanding    Education: Exercise Physiology & General Exercise Guidelines: - Group verbal and written instruction with models to review the exercise physiology of the cardiovascular system and associated critical values. Provides general exercise guidelines with specific guidelines to those with heart or lung disease. Written material provided at class time.   Education: Flexibility, Balance, Mind/Body  Relaxation: - Group verbal and visual presentation with interactive activity on the components of exercise prescription. Introduces F.I.T.T principle from ACSM for exercise prescriptions. Reviews F.I.T.T. principles of flexibility and balance exercise training including progression. Also discusses the mind body connection.  Reviews various relaxation  techniques to help reduce and manage stress (i.e. Deep breathing, progressive muscle relaxation, and visualization). Balance handout provided to take home. Written material provided at class time.   Activity Barriers & Risk Stratification:  Activity Barriers & Cardiac Risk Stratification - 08/27/24 1645       Activity Barriers & Cardiac Risk Stratification   Activity Barriers Arthritis;Joint Problems;Balance Concerns;Shortness of Breath    Cardiac Risk Stratification High          6 Minute Walk:  6 Minute Walk     Row Name 08/27/24 1644         6 Minute Walk   Phase Initial     Distance 380 feet     Walk Time 4.77 minutes     # of Rest Breaks 2     MPH 0.91     METS 1.1     RPE 13     Perceived Dyspnea  3     VO2 Peak 3.86     Symptoms Yes (comment)     Comments SOB     Resting HR 62 bpm     Resting BP 110/74     Resting Oxygen  Saturation  95 %     Exercise Oxygen  Saturation  during 6 min walk 95 %     Max Ex. HR 105 bpm     Max Ex. BP 120/84     2 Minute Post BP 104/78        Oxygen  Initial Assessment:   Oxygen  Re-Evaluation:   Oxygen  Discharge (Final Oxygen  Re-Evaluation):   Initial Exercise Prescription:  Initial Exercise Prescription - 08/27/24 1600       Date of Initial Exercise RX and Referring Provider   Date 08/27/24    Referring Provider Florencio Kava, MD      Oxygen    Maintain Oxygen  Saturation 88% or higher      Treadmill   MPH 0.6    Grade 0    Minutes 15    METs 1.1      Recumbant Bike   Level 1    RPM 50    Watts 15    Minutes 15    METs 1.1      NuStep   Level 1   T4 and T6   SPM 80    Minutes 15    METs 1.1      T5 Nustep   Level 1    SPM 80    Minutes 15    METs 1.1      Biostep-RELP   Level 1    SPM 50    Minutes 15    METs 1.1      Track   Laps 9    Minutes 15    METs 1.49      Prescription Details   Frequency (times per week) 3    Duration Progress to 30 minutes of continuous aerobic without  signs/symptoms of physical distress      Intensity   THRR 40-80% of Max Heartrate 97-133    Ratings of Perceived Exertion 11-13    Perceived Dyspnea 0-4      Progression   Progression Continue to progress workloads to maintain intensity without signs/symptoms of physical  distress.      Resistance Training   Training Prescription Yes    Weight 5 lb    Reps 10-15          Perform Capillary Blood Glucose checks as needed.  Exercise Prescription Changes:   Exercise Prescription Changes     Row Name 08/27/24 1600             Response to Exercise   Blood Pressure (Admit) 110/74       Blood Pressure (Exercise) 120/84       Blood Pressure (Exit) 104/78       Heart Rate (Admit) 62 bpm       Heart Rate (Exercise) 105 bpm       Heart Rate (Exit) 68 bpm       Oxygen  Saturation (Admit) 95 %       Oxygen  Saturation (Exercise) 95 %       Oxygen  Saturation (Exit) 94 %       Rating of Perceived Exertion (Exercise) 1       Perceived Dyspnea (Exercise) 3       Symptoms SOB       Comments results         Progression   Average METs 1.1          Exercise Comments:   Exercise Goals and Review:   Exercise Goals     Row Name 08/27/24 1648             Exercise Goals   Increase Physical Activity Yes       Intervention Provide advice, education, support and counseling about physical activity/exercise needs.;Develop an individualized exercise prescription for aerobic and resistive training based on initial evaluation findings, risk stratification, comorbidities and participant's personal goals.       Expected Outcomes Long Term: Add in home exercise to make exercise part of routine and to increase amount of physical activity.;Short Term: Attend rehab on a regular basis to increase amount of physical activity.;Long Term: Exercising regularly at least 3-5 days a week.       Increase Strength and Stamina Yes       Intervention Provide advice, education, support and  counseling about physical activity/exercise needs.;Develop an individualized exercise prescription for aerobic and resistive training based on initial evaluation findings, risk stratification, comorbidities and participant's personal goals.       Expected Outcomes Short Term: Increase workloads from initial exercise prescription for resistance, speed, and METs.;Short Term: Perform resistance training exercises routinely during rehab and add in resistance training at home;Long Term: Improve cardiorespiratory fitness, muscular endurance and strength as measured by increased METs and functional capacity ( )       Able to understand and use rate of perceived exertion (RPE) scale Yes       Intervention Provide education and explanation on how to use RPE scale       Expected Outcomes Short Term: Able to use RPE daily in rehab to express subjective intensity level;Long Term:  Able to use RPE to guide intensity level when exercising independently       Able to understand and use Dyspnea scale Yes       Intervention Provide education and explanation on how to use Dyspnea scale       Expected Outcomes Short Term: Able to use Dyspnea scale daily in rehab to express subjective sense of shortness of breath during exertion;Long Term: Able to use Dyspnea scale to guide intensity level when exercising independently  Knowledge and understanding of Target Heart Rate Range (THRR) Yes       Intervention Provide education and explanation of THRR including how the numbers were predicted and where they are located for reference       Expected Outcomes Short Term: Able to state/look up THRR;Short Term: Able to use daily as guideline for intensity in rehab;Long Term: Able to use THRR to govern intensity when exercising independently       Able to check pulse independently Yes       Intervention Provide education and demonstration on how to check pulse in carotid and radial arteries.;Review the importance of being able to  check your own pulse for safety during independent exercise       Expected Outcomes Short Term: Able to explain why pulse checking is important during independent exercise;Long Term: Able to check pulse independently and accurately       Understanding of Exercise Prescription Yes       Intervention Provide education, explanation, and written materials on patient's individual exercise prescription       Expected Outcomes Short Term: Able to explain program exercise prescription;Long Term: Able to explain home exercise prescription to exercise independently          Exercise Goals Re-Evaluation :   Discharge Exercise Prescription (Final Exercise Prescription Changes):  Exercise Prescription Changes - 08/27/24 1600       Response to Exercise   Blood Pressure (Admit) 110/74    Blood Pressure (Exercise) 120/84    Blood Pressure (Exit) 104/78    Heart Rate (Admit) 62 bpm    Heart Rate (Exercise) 105 bpm    Heart Rate (Exit) 68 bpm    Oxygen  Saturation (Admit) 95 %    Oxygen  Saturation (Exercise) 95 %    Oxygen  Saturation (Exit) 94 %    Rating of Perceived Exertion (Exercise) 1    Perceived Dyspnea (Exercise) 3    Symptoms SOB    Comments results      Progression   Average METs 1.1          Nutrition:  Target Goals: Understanding of nutrition guidelines, daily intake of sodium 1500mg , cholesterol 200mg , calories 30% from fat and 7% or less from saturated fats, daily to have 5 or more servings of fruits and vegetables.  Education: Nutrition 1 -Group instruction provided by verbal, written material, interactive activities, discussions, models, and posters to present general guidelines for heart healthy nutrition including macronutrients, label reading, and promoting whole foods over processed counterparts. Education serves as pensions consultant of discussion of heart healthy eating for all. Written material provided at class time.    Education: Nutrition 2 -Group instruction  provided by verbal, written material, interactive activities, discussions, models, and posters to present general guidelines for heart healthy nutrition including sodium, cholesterol, and saturated fat. Providing guidance of habit forming to improve blood pressure, cholesterol, and body weight. Written material provided at class time.     Biometrics:  Pre Biometrics - 08/27/24 1648       Pre Biometrics   Height 5' 9.1 (1.755 m)    Weight 213 lb 12.8 oz (97 kg)    Waist Circumference 46.1 inches    Hip Circumference 44.1 inches    Waist to Hip Ratio 1.05 %    BMI (Calculated) 31.49    Single Leg Stand 4.6 seconds           Nutrition Therapy Plan and Nutrition Goals:  Nutrition Therapy & Goals - 08/27/24 1649  Nutrition Therapy   RD appointment deferred Yes      Personal Nutrition Goals   Nutrition Goal RD appointment deferred at this time      Intervention Plan   Intervention Prescribe, educate and counsel regarding individualized specific dietary modifications aiming towards targeted core components such as weight, hypertension, lipid management, diabetes, heart failure and other comorbidities.    Expected Outcomes Short Term Goal: Understand basic principles of dietary content, such as calories, fat, sodium, cholesterol and nutrients.          Nutrition Assessments:  MEDIFICTS Score Key: >=70 Need to make dietary changes  40-70 Heart Healthy Diet <= 40 Therapeutic Level Cholesterol Diet  Flowsheet Row Cardiac Rehab from 08/21/2024 in [facility] Cardiac and Pulmonary Rehab  Picture Your Plate Total Score on Admission 41   Picture Your Plate Scores: <59 Unhealthy dietary pattern with much room for improvement. 41-50 Dietary pattern unlikely to meet recommendations for good health and room for improvement. 51-60 More healthful dietary pattern, with some room for improvement.  >60 Healthy dietary pattern, although there may be some specific behaviors that could be  improved.    Nutrition Goals Re-Evaluation:   Nutrition Goals Discharge (Final Nutrition Goals Re-Evaluation):   Psychosocial: Target Goals: Acknowledge presence or absence of significant depression and/or stress, maximize coping skills, provide positive support system. Participant is able to verbalize types and ability to use techniques and skills needed for reducing stress and depression.   Education: Stress, Anxiety, and Depression - Group verbal and visual presentation to define topics covered.  Reviews how body is impacted by stress, anxiety, and depression.  Also discusses healthy ways to reduce stress and to treat/manage anxiety and depression. Written material provided at class time. Flowsheet Row Cardiac Rehab from 08/27/2024 in [facility] Cardiac and Pulmonary Rehab  Date 08/27/24  Educator MB  Instruction Review Code 1- [employer] Understanding    Education: Sleep Hygiene -Provides group verbal and written instruction about how sleep can affect your health.  Define sleep hygiene, discuss sleep cycles and impact of sleep habits. Review good sleep hygiene tips.   Initial Review & Psychosocial Screening:  Initial Psych Review & Screening - 08/21/24 1339       Initial Review   Current issues with History of Depression;Current Psychotropic Meds;Current Stress Concerns    Source of Stress Concerns Unable to perform yard/household activities;Unable to participate in former interests or hobbies      Family Dynamics   Good Support System? Yes      Barriers   Psychosocial barriers to participate in program There are no identifiable barriers or psychosocial needs.;The patient should benefit from training in stress management and relaxation.      Screening Interventions   Interventions Encouraged to exercise;Provide feedback about the scores to participant;To provide support and resources with identified psychosocial needs    Expected Outcomes Short Term goal: Utilizing psychosocial  counselor, staff and physician to assist with identification of specific Stressors or current issues interfering with healing process. Setting desired goal for each stressor or current issue identified.;Long Term Goal: Stressors or current issues are controlled or eliminated.;Short Term goal: Identification and review with participant of any Quality of Life or Depression concerns found by scoring the questionnaire.;Long Term goal: The participant improves quality of Life and PHQ9 Scores as seen by post scores and/or verbalization of changes          Quality of Life Scores:   Quality of Life - 08/21/24 1403  Quality of Life   Select Quality of Life      Quality of Life Scores   Health/Function Pre 8.37 %    Socioeconomic Pre 19.56 %    Psych/Spiritual Pre 10.29 %    Family Pre 22.2 %    GLOBAL Pre 13.29 %         Scores of 19 and below usually indicate a poorer quality of life in these areas.  A difference of  2-3 points is a clinically meaningful difference.  A difference of 2-3 points in the total score of the Quality of Life Index has been associated with significant improvement in overall quality of life, self-image, physical symptoms, and general health in studies assessing change in quality of life.  PHQ-9: Review Flowsheet  More data exists      08/27/2024 07/24/2024 01/08/2024 10/15/2023 01/08/2023  Depression screen PHQ 2/9  Decreased Interest 1 2 0 1 1  Down, Depressed, Hopeless 1 2 0 1 1  PHQ - 2 Score 2 4 0 2 2  Altered sleeping 1 1 0 0 2  Tired, decreased energy 2 1 0 1 1  Change in appetite 0 0 0 0 2  Feeling bad or failure about yourself  2 2 0 0 2  Trouble concentrating 0 2 0 0 1  Moving slowly or fidgety/restless 1 2 0 0 1  Suicidal thoughts 0 0 0 0 0  PHQ-9 Score 8 12 0  3  11   Difficult doing work/chores Very difficult Somewhat difficult - Not difficult at all Somewhat difficult    Details       Data saved with a previous flowsheet row definition         Interpretation of Total Score  Total Score Depression Severity:  1-4 = Minimal depression, 5-9 = Mild depression, 10-14 = Moderate depression, 15-19 = Moderately severe depression, 20-27 = Severe depression   Psychosocial Evaluation and Intervention:  Psychosocial Evaluation - 08/21/24 1401       Psychosocial Evaluation & Interventions   Interventions Relaxation education;Encouraged to exercise with the program and follow exercise prescription;Stress management education    Comments Mr. Honse is coming to cardiac rehab with heart failure. His wife reports that he is on medication for depression and feels like it is managing his symptoms. She states that some days he does get down when he is unable to do the things he used to. His symptoms have slowed him down and they are hopeful this program will boost his stamina.    Expected Outcomes Short: attend cardiac rehab for education and exercise Long: develop and maintain positive self care habits    Continue Psychosocial Services  Follow up required by staff          Psychosocial Re-Evaluation:   Psychosocial Discharge (Final Psychosocial Re-Evaluation):   Vocational Rehabilitation: Provide vocational rehab assistance to qualifying candidates.   Vocational Rehab Evaluation & Intervention:  Vocational Rehab - 08/21/24 1339       Initial Vocational Rehab Evaluation & Intervention   Assessment shows need for Vocational Rehabilitation No          Education: Education Goals: Education classes will be provided on a variety of topics geared toward better understanding of heart health and risk factor modification. Participant will state understanding/return demonstration of topics presented as noted by education test scores.  Learning Barriers/Preferences:  Learning Barriers/Preferences - 08/21/24 1338       Learning Barriers/Preferences   Learning Barriers None  Learning Preferences Individual Instruction           General Cardiac Education Topics:  AED/CPR: - Group verbal and written instruction with the use of models to demonstrate the basic use of the AED with the basic ABC's of resuscitation.   Test and Procedures: - Group verbal and visual presentation and models provide information about basic cardiac anatomy and function. Reviews the testing methods done to diagnose heart disease and the outcomes of the test results. Describes the treatment choices: Medical Management, Angioplasty, or Coronary Bypass Surgery for treating various heart conditions including Myocardial Infarction, Angina, Valve Disease, and Cardiac Arrhythmias. Written material provided at class time. Flowsheet Row Cardiac Rehab from 08/27/2024 in [facility] Cardiac and Pulmonary Rehab  Education need identified 08/27/24    Medication Safety: - Group verbal and visual instruction to review commonly prescribed medications for heart and lung disease. Reviews the medication, class of the drug, and side effects. Includes the steps to properly store meds and maintain the prescription regimen. Written material provided at class time.   Intimacy: - Group verbal instruction through game format to discuss how heart and lung disease can affect sexual intimacy. Written material provided at class time.   Know Your Numbers and Heart Failure: - Group verbal and visual instruction to discuss disease risk factors for cardiac and pulmonary disease and treatment options.  Reviews associated critical values for Overweight/Obesity, Hypertension, Cholesterol, and Diabetes.  Discusses basics of heart failure: signs/symptoms and treatments.  Introduces Heart Failure Zone chart for action plan for heart failure. Written material provided at class time.   Infection Prevention: - Provides verbal and written material to individual with discussion of infection control including proper hand washing and proper equipment cleaning during exercise  session. Flowsheet Row Cardiac Rehab from 08/27/2024 in [facility] Cardiac and Pulmonary Rehab  Date 08/27/24  Educator MB  Instruction Review Code 1- Verbalizes Understanding    Falls Prevention: - Provides verbal and written material to individual with discussion of falls prevention and safety. Flowsheet Row Cardiac Rehab from 08/27/2024 in [facility] Cardiac and Pulmonary Rehab  Date 08/27/24  Educator MB  Instruction Review Code 1- Verbalizes Understanding    Other: -Provides group and verbal instruction on various topics (see comments)   Knowledge Questionnaire Score:  Knowledge Questionnaire Score - 08/21/24 1404       Knowledge Questionnaire Score   Pre Score 21/26          Core Components/Risk Factors/Patient Goals at Admission:  Personal Goals and Risk Factors at Admission - 08/27/24 1649       Core Components/Risk Factors/Patient Goals on Admission    Weight Management Yes;Weight Maintenance    Intervention Weight Management: Develop a combined nutrition and exercise program designed to reach desired caloric intake, while maintaining appropriate intake of nutrient and fiber, sodium and fats, and appropriate energy expenditure required for the weight goal.;Weight Management: Provide education and appropriate resources to help participant work on and attain dietary goals.;Weight Management/Obesity: Establish reasonable short term and long term weight goals.    Admit Weight 213 lb 12.8 oz (97 kg)    Goal Weight: Short Term 213 lb 12.8 oz (97 kg)    Goal Weight: Long Term 213 lb 12.8 oz (97 kg)    Expected Outcomes Short Term: Continue to assess and modify interventions until short term weight is achieved;Long Term: Adherence to nutrition and physical activity/exercise program aimed toward attainment of established weight goal;Weight Maintenance: Understanding of the daily nutrition guidelines, which includes 25-35%  calories from fat, 7% or less cal from saturated fats, less than  200mg  cholesterol, less than 1.5gm of sodium, & 5 or more servings of fruits and vegetables daily;Understanding recommendations for meals to include 15-35% energy as protein, 25-35% energy from fat, 35-60% energy from carbohydrates, less than 200mg  of dietary cholesterol, 20-35 gm of total fiber daily;Understanding of distribution of calorie intake throughout the day with the consumption of 4-5 meals/snacks    Heart Failure Yes    Intervention Provide a combined exercise and nutrition program that is supplemented with education, support and counseling about heart failure. Directed toward relieving symptoms such as shortness of breath, decreased exercise tolerance, and extremity edema.    Expected Outcomes Improve functional capacity of life;Short term: Daily weights obtained and reported for increase. Utilizing diuretic protocols set by physician.;Short term: Attendance in program 2-3 days a week with increased exercise capacity. Reported lower sodium intake. Reported increased fruit and vegetable intake. Reports medication compliance.;Long term: Adoption of self-care skills and reduction of barriers for early signs and symptoms recognition and intervention leading to self-care maintenance.    Hypertension Yes    Intervention Provide education on lifestyle modifcations including regular physical activity/exercise, weight management, moderate sodium restriction and increased consumption of fresh fruit, vegetables, and low fat dairy, alcohol moderation, and smoking cessation.;Monitor prescription use compliance.    Expected Outcomes Short Term: Continued assessment and intervention until BP is < 140/57mm HG in hypertensive participants. < 130/24mm HG in hypertensive participants with diabetes, heart failure or chronic kidney disease.;Long Term: Maintenance of blood pressure at goal levels.    Lipids Yes    Intervention Provide education and support for participant on nutrition & aerobic/resistive exercise  along with prescribed medications to achieve LDL 70mg , HDL >40mg .    Expected Outcomes Short Term: Participant states understanding of desired cholesterol values and is compliant with medications prescribed. Participant is following exercise prescription and nutrition guidelines.;Long Term: Cholesterol controlled with medications as prescribed, with individualized exercise RX and with personalized nutrition plan. Value goals: LDL < 70mg , HDL > 40 mg.          Education:Diabetes - Individual verbal and written instruction to review signs/symptoms of diabetes, desired ranges of glucose level fasting, after meals and with exercise. Acknowledge that pre and post exercise glucose checks will be done for 3 sessions at entry of program.   Core Components/Risk Factors/Patient Goals Review:    Core Components/Risk Factors/Patient Goals at Discharge (Final Review):    ITP Comments:  ITP Comments     Row Name 08/21/24 1408 08/27/24 1644         ITP Comments Initial phone call completed. Diagnosis can be found in [facility] 11/20. EP Orientation scheduled for Monday 12/8 at 9am. Completed and gym orientation for cardiac rehab. Initial ITP created and sent for review to Dr. Oneil Pinal, Medical Director.         Comments: Initial ITP     [1]  Current Outpatient Medications:    albuterol  (VENTOLIN  HFA) 108 (90 Base) MCG/ACT inhaler, Inhale 2 puffs into the lungs every 6 (six) hours as needed for wheezing or shortness of breath., Disp: , Rfl:    amiodarone  (PACERONE ) 200 MG tablet, Take 200 mg by mouth daily., Disp: , Rfl:    aspirin  81 MG tablet, Take 81 mg by mouth daily., Disp: , Rfl:    clopidogrel (PLAVIX) 75 MG tablet, Take 75 mg by mouth., Disp: , Rfl:    dapagliflozin  propanediol (FARXIGA ) 10 MG TABS  tablet, Take 10 mg by mouth daily., Disp: , Rfl:    diphenoxylate -atropine  (LOMOTIL ) 2.5-0.025 MG tablet, Take 2 tablets by mouth 2 (two) times daily as needed for diarrhea or loose  stools., Disp: 20 tablet, Rfl: 1   guaiFENesin  (MUCINEX ) 600 MG 12 hr tablet, Take 600 mg by mouth., Disp: , Rfl:    ipratropium (ATROVENT  HFA) 17 MCG/ACT inhaler, Inhale 2 puffs into the lungs every 8 (eight) hours., Disp: 1 each, Rfl: 12   metoprolol  succinate (TOPROL -XL) 25 MG 24 hr tablet, Take 1 tablet (25 mg total) by mouth daily., Disp: 30 tablet, Rfl: 0   montelukast  (SINGULAIR ) 10 MG tablet, Take 1 tablet (10 mg total) by mouth daily., Disp: 30 tablet, Rfl: 5   nitroGLYCERIN  (NITROSTAT ) 0.4 MG SL tablet, Place 0.4 mg under the tongue., Disp: , Rfl:    omeprazole (PRILOSEC) 20 MG capsule, Take 20 mg by mouth at bedtime., Disp: , Rfl:    OXYGEN , Inhale 2 L into the lungs every evening. Via cpap, Disp: , Rfl:    roflumilast (DALIRESP) 500 MCG TABS tablet, Take 500 mcg by mouth daily., Disp: , Rfl:    rosuvastatin  (CRESTOR ) 40 MG tablet, Take 1 tablet by mouth once daily, Disp: 90 tablet, Rfl: 3   sacubitril -valsartan  (ENTRESTO ) 97-103 MG, Take 1 tablet by mouth 2 (two) times daily., Disp: , Rfl:    [Paused] spironolactone  (ALDACTONE ) 25 MG tablet, Take 12.5 mg by mouth daily. Half tablet, Disp: , Rfl:    torsemide  (DEMADEX ) 20 MG tablet, Take 40 mg by mouth daily., Disp: , Rfl:    venlafaxine  XR (EFFEXOR -XR) 150 MG 24 hr capsule, TAKE 1 CAPSULE BY MOUTH ONCE DAILY WITH 75MG  DAILY, Disp: 90 capsule, Rfl: 3 [2]  Social History Tobacco Use  Smoking Status Former   Current packs/day: 0.00   Average packs/day: 1 pack/day for 41.0 years (41.0 ttl pk-yrs)   Types: Cigars, Cigarettes   Quit date: 07/2023   Years since quitting: 1.1  Smokeless Tobacco Never  Tobacco Comments   smokes 1-2 cigars daily; quit cigs. 30+ years ago

## 2024-08-31 ENCOUNTER — Encounter

## 2024-08-31 DIAGNOSIS — I5022 Chronic systolic (congestive) heart failure: Secondary | ICD-10-CM | POA: Diagnosis not present

## 2024-08-31 NOTE — Progress Notes (Signed)
 Daily Session Note  Patient Details  Name: Dylan Lee MRN: [id number] Date of Birth: 1956/01/29 Referring Provider:   Flowsheet Row Cardiac Rehab from 08/27/2024 in [facility] Cardiac and Pulmonary Rehab  Referring Provider Florencio Kava, MD    Encounter Date: 08/31/2024  Check In:  Session Check In - 08/31/24 1408       Check-In   Supervising physician immediately available to respond to emergencies See telemetry face sheet for immediately available ER MD    Location [facility]-Cardiac & Pulmonary Rehab    Staff Present Leita Franks RN,BSN;Maxon Conetta BS, Exercise Physiologist;Noah Tickle, BS, Exercise Physiologist;Jason Elnor RDN,LDN    Virtual Visit No    Medication changes reported     No    Fall or balance concerns reported    No    Warm-up and Cool-down Performed on first and last piece of equipment    Resistance Training Performed Yes    VAD Patient? No    PAD/SET Patient? No      Pain Assessment   Currently in Pain? No/denies             Tobacco Use History[1]  Goals Met:  Independence with exercise equipment Exercise tolerated well No report of concerns or symptoms today Strength training completed today  Goals Unmet:  Not Applicable  Comments: First full day of exercise!  Patient was oriented to gym and equipment including functions, settings, policies, and procedures.  Patient's individual exercise prescription and treatment plan were reviewed.  All starting workloads were established based on the results of the 6 minute walk test done at initial orientation visit.  The plan for exercise progression was also introduced and progression will be customized based on patient's performance and goals.    Dr. Oneil Pinal is Medical Director for [facility] Cardiac Rehabilitation.  Dr. Fuad Aleskerov is Medical Director for [facility] Pulmonary Rehabilitation.    [1]  Social History Tobacco Use  Smoking Status Former   Current packs/day: 0.00   Average  packs/day: 1 pack/day for 41.0 years (41.0 ttl pk-yrs)   Types: Cigars, Cigarettes   Quit date: 07/2023   Years since quitting: 1.1  Smokeless Tobacco Never  Tobacco Comments   smokes 1-2 cigars daily; quit cigs. 30+ years ago

## 2024-09-02 ENCOUNTER — Encounter: Payer: Self-pay | Admitting: *Deleted

## 2024-09-02 ENCOUNTER — Encounter

## 2024-09-02 DIAGNOSIS — I5022 Chronic systolic (congestive) heart failure: Secondary | ICD-10-CM

## 2024-09-02 NOTE — Progress Notes (Signed)
 Daily Session Note  Patient Details  Name: Dylan Lee MRN: [id number] Date of Birth: 06/24/1956 Referring Provider:   Flowsheet Row Cardiac Rehab from 08/27/2024 in [facility] Cardiac and Pulmonary Rehab  Referring Provider Florencio Kava, MD    Encounter Date: 09/02/2024  Check In:  Session Check In - 09/02/24 1412       Check-In   Supervising physician immediately available to respond to emergencies See telemetry face sheet for immediately available ER MD    Location [facility]-Cardiac & Pulmonary Rehab    Staff Present Leita Franks RN,BSN;Noah Tickle, BS, Exercise Physiologist;Kelly Dyane HECKLE, ACSM CEP, Exercise Physiologist;Kelly Bollinger [facility]    Virtual Visit No    Medication changes reported     No    Fall or balance concerns reported    No    Warm-up and Cool-down Performed on first and last piece of equipment    Resistance Training Performed Yes    VAD Patient? No    PAD/SET Patient? No      Pain Assessment   Currently in Pain? No/denies             Tobacco Use History[1]  Goals Met:  Independence with exercise equipment Exercise tolerated well No report of concerns or symptoms today Strength training completed today  Goals Unmet:  Not Applicable  Comments: Pt able to follow exercise prescription today without complaint.  Will continue to monitor for progression.    Dr. Oneil Pinal is Medical Director for [facility] Cardiac Rehabilitation.  Dr. Fuad Aleskerov is Medical Director for [facility] Pulmonary Rehabilitation.    [1]  Social History Tobacco Use  Smoking Status Former   Current packs/day: 0.00   Average packs/day: 1 pack/day for 41.0 years (41.0 ttl pk-yrs)   Types: Cigars, Cigarettes   Quit date: 07/2023   Years since quitting: 1.1  Smokeless Tobacco Never  Tobacco Comments   smokes 1-2 cigars daily; quit cigs. 30+ years ago

## 2024-09-02 NOTE — Progress Notes (Signed)
 Cardiac Individual Treatment Plan  Patient Details  Name: Dylan Lee MRN: [id number] Date of Birth: 10-25-1955 Referring Provider:   Flowsheet Row Cardiac Rehab from 08/27/2024 in [facility] Cardiac and Pulmonary Rehab  Referring Provider Florencio Kava, MD    Initial Encounter Date:  Flowsheet Row Cardiac Rehab from 08/27/2024 in [facility] Cardiac and Pulmonary Rehab  Date 08/27/24    Visit Diagnosis: Heart failure, chronic systolic (HCC)  Patient's Home Medications on Admission: Current Medications[1]  Past Medical History: Past Medical History:  Diagnosis Date   Arthritis    CHF (congestive heart failure) (HCC)    COPD (chronic obstructive pulmonary disease) (HCC)    Depression    Encephalopathy due to COVID-19 virus 09/07/2021   GERD (gastroesophageal reflux disease)    Hepatitis C    Hep C treated    History of chicken pox    History of measles    History of mumps    Hyperlipidemia    Hypertension    Kidney stones    Oxygen  deficiency    Pneumonia due to COVID-19 virus 09/07/2021   Shingles 09/06/2015   Sleep apnea     Tobacco Use: Tobacco Use History[2]  Labs: Review Flowsheet  More data exists      Latest Ref Rng & Units 10/06/2021 05/07/2022 01/08/2023 07/10/2023 02/01/2024  Labs for ITP Cardiac and Pulmonary Rehab  Cholestrol 0 - 200 mg/dL - 848  - 869  873   LDL (calc) 0 - 99 mg/dL - 91  - 70  69   HDL-C >40 mg/dL - 33  - 31  36   Trlycerides <150 mg/dL - 849  - 827  893   Hemoglobin A1c 4.8 - 5.6 % 6.2  5.9  5.5  5.7  -     Exercise Target Goals: Exercise Program Goal: Individual exercise prescription set using results from initial 6 min walk test and THRR while considering  patients activity barriers and safety.   Exercise Prescription Goal: Initial exercise prescription builds to 30-45 minutes a day of aerobic activity, 2-3 days per week.  Home exercise guidelines will be given to patient during program as part of exercise prescription that the  participant will acknowledge.   Education: Aerobic Exercise: - Group verbal and visual presentation on the components of exercise prescription. Introduces F.I.T.T principle from ACSM for exercise prescriptions.  Reviews F.I.T.T. principles of aerobic exercise including progression. Written material provided at class time. Flowsheet Row Cardiac Rehab from 08/27/2024 in [facility] Cardiac and Pulmonary Rehab  Education need identified 08/27/24    Education: Resistance Exercise: - Group verbal and visual presentation on the components of exercise prescription. Introduces F.I.T.T principle from ACSM for exercise prescriptions  Reviews F.I.T.T. principles of resistance exercise including progression. Written material provided at class time. Flowsheet Row Cardiac Rehab from 08/27/2024 in [facility] Cardiac and Pulmonary Rehab  Education need identified 08/27/24     Education: Exercise & Equipment Safety: - Individual verbal instruction and demonstration of equipment use and safety with use of the equipment. Flowsheet Row Cardiac Rehab from 08/27/2024 in [facility] Cardiac and Pulmonary Rehab  Date 08/27/24  Educator MB  Instruction Review Code 1- Verbalizes Understanding    Education: Exercise Physiology & General Exercise Guidelines: - Group verbal and written instruction with models to review the exercise physiology of the cardiovascular system and associated critical values. Provides general exercise guidelines with specific guidelines to those with heart or lung disease. Written material provided at class time.   Education: Flexibility, Balance, Mind/Body  Relaxation: - Group verbal and visual presentation with interactive activity on the components of exercise prescription. Introduces F.I.T.T principle from ACSM for exercise prescriptions. Reviews F.I.T.T. principles of flexibility and balance exercise training including progression. Also discusses the mind body connection.  Reviews various relaxation  techniques to help reduce and manage stress (i.e. Deep breathing, progressive muscle relaxation, and visualization). Balance handout provided to take home. Written material provided at class time.   Activity Barriers & Risk Stratification:  Activity Barriers & Cardiac Risk Stratification - 08/27/24 1645       Activity Barriers & Cardiac Risk Stratification   Activity Barriers Arthritis;Joint Problems;Balance Concerns;Shortness of Breath    Cardiac Risk Stratification High          6 Minute Walk:  6 Minute Walk     Row Name 08/27/24 1644         6 Minute Walk   Phase Initial     Distance 380 feet     Walk Time 4.77 minutes     # of Rest Breaks 2     MPH 0.91     METS 1.1     RPE 13     Perceived Dyspnea  3     VO2 Peak 3.86     Symptoms Yes (comment)     Comments SOB     Resting HR 62 bpm     Resting BP 110/74     Resting Oxygen  Saturation  95 %     Exercise Oxygen  Saturation  during 6 min walk 95 %     Max Ex. HR 105 bpm     Max Ex. BP 120/84     2 Minute Post BP 104/78        Oxygen  Initial Assessment:   Oxygen  Re-Evaluation:   Oxygen  Discharge (Final Oxygen  Re-Evaluation):   Initial Exercise Prescription:  Initial Exercise Prescription - 08/27/24 1600       Date of Initial Exercise RX and Referring Provider   Date 08/27/24    Referring Provider Florencio Kava, MD      Oxygen    Maintain Oxygen  Saturation 88% or higher      Treadmill   MPH 0.6    Grade 0    Minutes 15    METs 1.1      Recumbant Bike   Level 1    RPM 50    Watts 15    Minutes 15    METs 1.1      NuStep   Level 1   T4 and T6   SPM 80    Minutes 15    METs 1.1      T5 Nustep   Level 1    SPM 80    Minutes 15    METs 1.1      Biostep-RELP   Level 1    SPM 50    Minutes 15    METs 1.1      Track   Laps 9    Minutes 15    METs 1.49      Prescription Details   Frequency (times per week) 3    Duration Progress to 30 minutes of continuous aerobic without  signs/symptoms of physical distress      Intensity   THRR 40-80% of Max Heartrate 97-133    Ratings of Perceived Exertion 11-13    Perceived Dyspnea 0-4      Progression   Progression Continue to progress workloads to maintain intensity without signs/symptoms of physical  distress.      Resistance Training   Training Prescription Yes    Weight 5 lb    Reps 10-15          Perform Capillary Blood Glucose checks as needed.  Exercise Prescription Changes:   Exercise Prescription Changes     Row Name 08/27/24 1600             Response to Exercise   Blood Pressure (Admit) 110/74       Blood Pressure (Exercise) 120/84       Blood Pressure (Exit) 104/78       Heart Rate (Admit) 62 bpm       Heart Rate (Exercise) 105 bpm       Heart Rate (Exit) 68 bpm       Oxygen  Saturation (Admit) 95 %       Oxygen  Saturation (Exercise) 95 %       Oxygen  Saturation (Exit) 94 %       Rating of Perceived Exertion (Exercise) 1       Perceived Dyspnea (Exercise) 3       Symptoms SOB       Comments results         Progression   Average METs 1.1          Exercise Comments:   Exercise Comments     Row Name 08/31/24 1409           Exercise Comments First full day of exercise!  Patient was oriented to gym and equipment including functions, settings, policies, and procedures.  Patient's individual exercise prescription and treatment plan were reviewed.  All starting workloads were established based on the results of the 6 minute walk test done at initial orientation visit.  The plan for exercise progression was also introduced and progression will be customized based on patient's performance and goals.          Exercise Goals and Review:   Exercise Goals     Row Name 08/27/24 1648             Exercise Goals   Increase Physical Activity Yes       Intervention Provide advice, education, support and counseling about physical activity/exercise needs.;Develop an  individualized exercise prescription for aerobic and resistive training based on initial evaluation findings, risk stratification, comorbidities and participant's personal goals.       Expected Outcomes Long Term: Add in home exercise to make exercise part of routine and to increase amount of physical activity.;Short Term: Attend rehab on a regular basis to increase amount of physical activity.;Long Term: Exercising regularly at least 3-5 days a week.       Increase Strength and Stamina Yes       Intervention Provide advice, education, support and counseling about physical activity/exercise needs.;Develop an individualized exercise prescription for aerobic and resistive training based on initial evaluation findings, risk stratification, comorbidities and participant's personal goals.       Expected Outcomes Short Term: Increase workloads from initial exercise prescription for resistance, speed, and METs.;Short Term: Perform resistance training exercises routinely during rehab and add in resistance training at home;Long Term: Improve cardiorespiratory fitness, muscular endurance and strength as measured by increased METs and functional capacity ( )       Able to understand and use rate of perceived exertion (RPE) scale Yes       Intervention Provide education and explanation on how to use RPE scale       Expected Outcomes  Short Term: Able to use RPE daily in rehab to express subjective intensity level;Long Term:  Able to use RPE to guide intensity level when exercising independently       Able to understand and use Dyspnea scale Yes       Intervention Provide education and explanation on how to use Dyspnea scale       Expected Outcomes Short Term: Able to use Dyspnea scale daily in rehab to express subjective sense of shortness of breath during exertion;Long Term: Able to use Dyspnea scale to guide intensity level when exercising independently       Knowledge and understanding of Target Heart Rate Range  (THRR) Yes       Intervention Provide education and explanation of THRR including how the numbers were predicted and where they are located for reference       Expected Outcomes Short Term: Able to state/look up THRR;Short Term: Able to use daily as guideline for intensity in rehab;Long Term: Able to use THRR to govern intensity when exercising independently       Able to check pulse independently Yes       Intervention Provide education and demonstration on how to check pulse in carotid and radial arteries.;Review the importance of being able to check your own pulse for safety during independent exercise       Expected Outcomes Short Term: Able to explain why pulse checking is important during independent exercise;Long Term: Able to check pulse independently and accurately       Understanding of Exercise Prescription Yes       Intervention Provide education, explanation, and written materials on patient's individual exercise prescription       Expected Outcomes Short Term: Able to explain program exercise prescription;Long Term: Able to explain home exercise prescription to exercise independently          Exercise Goals Re-Evaluation :  Exercise Goals Re-Evaluation     Row Name 08/31/24 1409             Exercise Goal Re-Evaluation   Exercise Goals Review Increase Physical Activity;Able to understand and use rate of perceived exertion (RPE) scale;Knowledge and understanding of Target Heart Rate Range (THRR);Understanding of Exercise Prescription;Increase Strength and Stamina;Able to understand and use Dyspnea scale;Able to check pulse independently       Comments Reviewed RPE and dyspnea scale, THR and program prescription with pt today.  Pt voiced understanding and was given a copy of goals to take home.       Expected Outcomes Short: Use RPE daily to regulate intensity.  Long: Follow program prescription in THR.          Discharge Exercise Prescription (Final Exercise Prescription  Changes):  Exercise Prescription Changes - 08/27/24 1600       Response to Exercise   Blood Pressure (Admit) 110/74    Blood Pressure (Exercise) 120/84    Blood Pressure (Exit) 104/78    Heart Rate (Admit) 62 bpm    Heart Rate (Exercise) 105 bpm    Heart Rate (Exit) 68 bpm    Oxygen  Saturation (Admit) 95 %    Oxygen  Saturation (Exercise) 95 %    Oxygen  Saturation (Exit) 94 %    Rating of Perceived Exertion (Exercise) 1    Perceived Dyspnea (Exercise) 3    Symptoms SOB    Comments results      Progression   Average METs 1.1          Nutrition:  Target Goals:  Understanding of nutrition guidelines, daily intake of sodium 1500mg , cholesterol 200mg , calories 30% from fat and 7% or less from saturated fats, daily to have 5 or more servings of fruits and vegetables.  Education: Nutrition 1 -Group instruction provided by verbal, written material, interactive activities, discussions, models, and posters to present general guidelines for heart healthy nutrition including macronutrients, label reading, and promoting whole foods over processed counterparts. Education serves as pensions consultant of discussion of heart healthy eating for all. Written material provided at class time.    Education: Nutrition 2 -Group instruction provided by verbal, written material, interactive activities, discussions, models, and posters to present general guidelines for heart healthy nutrition including sodium, cholesterol, and saturated fat. Providing guidance of habit forming to improve blood pressure, cholesterol, and body weight. Written material provided at class time.     Biometrics:  Pre Biometrics - 08/27/24 1648       Pre Biometrics   Height 5' 9.1 (1.755 m)    Weight 213 lb 12.8 oz (97 kg)    Waist Circumference 46.1 inches    Hip Circumference 44.1 inches    Waist to Hip Ratio 1.05 %    BMI (Calculated) 31.49    Single Leg Stand 4.6 seconds           Nutrition Therapy Plan and  Nutrition Goals:  Nutrition Therapy & Goals - 08/27/24 1649       Nutrition Therapy   RD appointment deferred Yes      Personal Nutrition Goals   Nutrition Goal RD appointment deferred at this time      Intervention Plan   Intervention Prescribe, educate and counsel regarding individualized specific dietary modifications aiming towards targeted core components such as weight, hypertension, lipid management, diabetes, heart failure and other comorbidities.    Expected Outcomes Short Term Goal: Understand basic principles of dietary content, such as calories, fat, sodium, cholesterol and nutrients.          Nutrition Assessments:  MEDIFICTS Score Key: >=70 Need to make dietary changes  40-70 Heart Healthy Diet <= 40 Therapeutic Level Cholesterol Diet  Flowsheet Row Cardiac Rehab from 08/21/2024 in [facility] Cardiac and Pulmonary Rehab  Picture Your Plate Total Score on Admission 41   Picture Your Plate Scores: <59 Unhealthy dietary pattern with much room for improvement. 41-50 Dietary pattern unlikely to meet recommendations for good health and room for improvement. 51-60 More healthful dietary pattern, with some room for improvement.  >60 Healthy dietary pattern, although there may be some specific behaviors that could be improved.    Nutrition Goals Re-Evaluation:   Nutrition Goals Discharge (Final Nutrition Goals Re-Evaluation):   Psychosocial: Target Goals: Acknowledge presence or absence of significant depression and/or stress, maximize coping skills, provide positive support system. Participant is able to verbalize types and ability to use techniques and skills needed for reducing stress and depression.   Education: Stress, Anxiety, and Depression - Group verbal and visual presentation to define topics covered.  Reviews how body is impacted by stress, anxiety, and depression.  Also discusses healthy ways to reduce stress and to treat/manage anxiety and depression. Written  material provided at class time. Flowsheet Row Cardiac Rehab from 08/27/2024 in [facility] Cardiac and Pulmonary Rehab  Date 08/27/24  Educator MB  Instruction Review Code 1- [employer] Understanding    Education: Sleep Hygiene -Provides group verbal and written instruction about how sleep can affect your health.  Define sleep hygiene, discuss sleep cycles and impact of sleep habits. Review good sleep hygiene  tips.   Initial Review & Psychosocial Screening:  Initial Psych Review & Screening - 08/21/24 1339       Initial Review   Current issues with History of Depression;Current Psychotropic Meds;Current Stress Concerns    Source of Stress Concerns Unable to perform yard/household activities;Unable to participate in former interests or hobbies      Family Dynamics   Good Support System? Yes      Barriers   Psychosocial barriers to participate in program There are no identifiable barriers or psychosocial needs.;The patient should benefit from training in stress management and relaxation.      Screening Interventions   Interventions Encouraged to exercise;Provide feedback about the scores to participant;To provide support and resources with identified psychosocial needs    Expected Outcomes Short Term goal: Utilizing psychosocial counselor, staff and physician to assist with identification of specific Stressors or current issues interfering with healing process. Setting desired goal for each stressor or current issue identified.;Long Term Goal: Stressors or current issues are controlled or eliminated.;Short Term goal: Identification and review with participant of any Quality of Life or Depression concerns found by scoring the questionnaire.;Long Term goal: The participant improves quality of Life and PHQ9 Scores as seen by post scores and/or verbalization of changes          Quality of Life Scores:   Quality of Life - 08/21/24 1403       Quality of Life   Select Quality of Life       Quality of Life Scores   Health/Function Pre 8.37 %    Socioeconomic Pre 19.56 %    Psych/Spiritual Pre 10.29 %    Family Pre 22.2 %    GLOBAL Pre 13.29 %         Scores of 19 and below usually indicate a poorer quality of life in these areas.  A difference of  2-3 points is a clinically meaningful difference.  A difference of 2-3 points in the total score of the Quality of Life Index has been associated with significant improvement in overall quality of life, self-image, physical symptoms, and general health in studies assessing change in quality of life.  PHQ-9: Review Flowsheet  More data exists      08/27/2024 07/24/2024 01/08/2024 10/15/2023 01/08/2023  Depression screen PHQ 2/9  Decreased Interest 1 2 0 1 1  Down, Depressed, Hopeless 1 2 0 1 1  PHQ - 2 Score 2 4 0 2 2  Altered sleeping 1 1 0 0 2  Tired, decreased energy 2 1 0 1 1  Change in appetite 0 0 0 0 2  Feeling bad or failure about yourself  2 2 0 0 2  Trouble concentrating 0 2 0 0 1  Moving slowly or fidgety/restless 1 2 0 0 1  Suicidal thoughts 0 0 0 0 0  PHQ-9 Score 8 12 0  3  11   Difficult doing work/chores Very difficult Somewhat difficult - Not difficult at all Somewhat difficult    Details       Data saved with a previous flowsheet row definition        Interpretation of Total Score  Total Score Depression Severity:  1-4 = Minimal depression, 5-9 = Mild depression, 10-14 = Moderate depression, 15-19 = Moderately severe depression, 20-27 = Severe depression   Psychosocial Evaluation and Intervention:  Psychosocial Evaluation - 08/21/24 1401       Psychosocial Evaluation & Interventions   Interventions Relaxation education;Encouraged to exercise with the  program and follow exercise prescription;Stress management education    Comments Mr. Dunstan is coming to cardiac rehab with heart failure. His wife reports that he is on medication for depression and feels like it is managing his symptoms. She states  that some days he does get down when he is unable to do the things he used to. His symptoms have slowed him down and they are hopeful this program will boost his stamina.    Expected Outcomes Short: attend cardiac rehab for education and exercise Long: develop and maintain positive self care habits    Continue Psychosocial Services  Follow up required by staff          Psychosocial Re-Evaluation:   Psychosocial Discharge (Final Psychosocial Re-Evaluation):   Vocational Rehabilitation: Provide vocational rehab assistance to qualifying candidates.   Vocational Rehab Evaluation & Intervention:  Vocational Rehab - 08/21/24 1339       Initial Vocational Rehab Evaluation & Intervention   Assessment shows need for Vocational Rehabilitation No          Education: Education Goals: Education classes will be provided on a variety of topics geared toward better understanding of heart health and risk factor modification. Participant will state understanding/return demonstration of topics presented as noted by education test scores.  Learning Barriers/Preferences:  Learning Barriers/Preferences - 08/21/24 1338       Learning Barriers/Preferences   Learning Barriers None    Learning Preferences Individual Instruction          General Cardiac Education Topics:  AED/CPR: - Group verbal and written instruction with the use of models to demonstrate the basic use of the AED with the basic ABC's of resuscitation.   Test and Procedures: - Group verbal and visual presentation and models provide information about basic cardiac anatomy and function. Reviews the testing methods done to diagnose heart disease and the outcomes of the test results. Describes the treatment choices: Medical Management, Angioplasty, or Coronary Bypass Surgery for treating various heart conditions including Myocardial Infarction, Angina, Valve Disease, and Cardiac Arrhythmias. Written material provided at class  time. Flowsheet Row Cardiac Rehab from 08/27/2024 in [facility] Cardiac and Pulmonary Rehab  Education need identified 08/27/24    Medication Safety: - Group verbal and visual instruction to review commonly prescribed medications for heart and lung disease. Reviews the medication, class of the drug, and side effects. Includes the steps to properly store meds and maintain the prescription regimen. Written material provided at class time.   Intimacy: - Group verbal instruction through game format to discuss how heart and lung disease can affect sexual intimacy. Written material provided at class time.   Know Your Numbers and Heart Failure: - Group verbal and visual instruction to discuss disease risk factors for cardiac and pulmonary disease and treatment options.  Reviews associated critical values for Overweight/Obesity, Hypertension, Cholesterol, and Diabetes.  Discusses basics of heart failure: signs/symptoms and treatments.  Introduces Heart Failure Zone chart for action plan for heart failure. Written material provided at class time.   Infection Prevention: - Provides verbal and written material to individual with discussion of infection control including proper hand washing and proper equipment cleaning during exercise session. Flowsheet Row Cardiac Rehab from 08/27/2024 in [facility] Cardiac and Pulmonary Rehab  Date 08/27/24  Educator MB  Instruction Review Code 1- Verbalizes Understanding    Falls Prevention: - Provides verbal and written material to individual with discussion of falls prevention and safety. Flowsheet Row Cardiac Rehab from 08/27/2024 in [facility] Cardiac and Pulmonary Rehab  Date 08/27/24  Educator MB  Instruction Review Code 1- Verbalizes Understanding    Other: -Provides group and verbal instruction on various topics (see comments)   Knowledge Questionnaire Score:  Knowledge Questionnaire Score - 08/21/24 1404       Knowledge Questionnaire Score   Pre Score 21/26           Core Components/Risk Factors/Patient Goals at Admission:  Personal Goals and Risk Factors at Admission - 08/27/24 1649       Core Components/Risk Factors/Patient Goals on Admission    Weight Management Yes;Weight Maintenance    Intervention Weight Management: Develop a combined nutrition and exercise program designed to reach desired caloric intake, while maintaining appropriate intake of nutrient and fiber, sodium and fats, and appropriate energy expenditure required for the weight goal.;Weight Management: Provide education and appropriate resources to help participant work on and attain dietary goals.;Weight Management/Obesity: Establish reasonable short term and long term weight goals.    Admit Weight 213 lb 12.8 oz (97 kg)    Goal Weight: Short Term 213 lb 12.8 oz (97 kg)    Goal Weight: Long Term 213 lb 12.8 oz (97 kg)    Expected Outcomes Short Term: Continue to assess and modify interventions until short term weight is achieved;Long Term: Adherence to nutrition and physical activity/exercise program aimed toward attainment of established weight goal;Weight Maintenance: Understanding of the daily nutrition guidelines, which includes 25-35% calories from fat, 7% or less cal from saturated fats, less than 200mg  cholesterol, less than 1.5gm of sodium, & 5 or more servings of fruits and vegetables daily;Understanding recommendations for meals to include 15-35% energy as protein, 25-35% energy from fat, 35-60% energy from carbohydrates, less than 200mg  of dietary cholesterol, 20-35 gm of total fiber daily;Understanding of distribution of calorie intake throughout the day with the consumption of 4-5 meals/snacks    Heart Failure Yes    Intervention Provide a combined exercise and nutrition program that is supplemented with education, support and counseling about heart failure. Directed toward relieving symptoms such as shortness of breath, decreased exercise tolerance, and extremity edema.     Expected Outcomes Improve functional capacity of life;Short term: Daily weights obtained and reported for increase. Utilizing diuretic protocols set by physician.;Short term: Attendance in program 2-3 days a week with increased exercise capacity. Reported lower sodium intake. Reported increased fruit and vegetable intake. Reports medication compliance.;Long term: Adoption of self-care skills and reduction of barriers for early signs and symptoms recognition and intervention leading to self-care maintenance.    Hypertension Yes    Intervention Provide education on lifestyle modifcations including regular physical activity/exercise, weight management, moderate sodium restriction and increased consumption of fresh fruit, vegetables, and low fat dairy, alcohol moderation, and smoking cessation.;Monitor prescription use compliance.    Expected Outcomes Short Term: Continued assessment and intervention until BP is < 140/58mm HG in hypertensive participants. < 130/78mm HG in hypertensive participants with diabetes, heart failure or chronic kidney disease.;Long Term: Maintenance of blood pressure at goal levels.    Lipids Yes    Intervention Provide education and support for participant on nutrition & aerobic/resistive exercise along with prescribed medications to achieve LDL 70mg , HDL >40mg .    Expected Outcomes Short Term: Participant states understanding of desired cholesterol values and is compliant with medications prescribed. Participant is following exercise prescription and nutrition guidelines.;Long Term: Cholesterol controlled with medications as prescribed, with individualized exercise RX and with personalized nutrition plan. Value goals: LDL < 70mg , HDL > 40 mg.  Education:Diabetes - Individual verbal and written instruction to review signs/symptoms of diabetes, desired ranges of glucose level fasting, after meals and with exercise. Acknowledge that pre and post exercise glucose checks  will be done for 3 sessions at entry of program.   Core Components/Risk Factors/Patient Goals Review:    Core Components/Risk Factors/Patient Goals at Discharge (Final Review):    ITP Comments:  ITP Comments     Row Name 08/21/24 1408 08/27/24 1644 08/31/24 1409 09/02/24 0938     ITP Comments Initial phone call completed. Diagnosis can be found in [facility] 11/20. EP Orientation scheduled for Monday 12/8 at 9am. Completed and gym orientation for cardiac rehab. Initial ITP created and sent for review to Dr. Oneil Pinal, Medical Director. First full day of exercise!  Patient was oriented to gym and equipment including functions, settings, policies, and procedures.  Patient's individual exercise prescription and treatment plan were reviewed.  All starting workloads were established based on the results of the 6 minute walk test done at initial orientation visit.  The plan for exercise progression was also introduced and progression will be customized based on patient's performance and goals. 30 Day review completed. Medical Director ITP review done, changes made as directed, and signed approval by Medical Director. New to Program       Comments: 30 Day Review     [1]  Current Outpatient Medications:    albuterol  (VENTOLIN  HFA) 108 (90 Base) MCG/ACT inhaler, Inhale 2 puffs into the lungs every 6 (six) hours as needed for wheezing or shortness of breath., Disp: , Rfl:    amiodarone  (PACERONE ) 200 MG tablet, Take 200 mg by mouth daily., Disp: , Rfl:    aspirin  81 MG tablet, Take 81 mg by mouth daily., Disp: , Rfl:    clopidogrel (PLAVIX) 75 MG tablet, Take 75 mg by mouth., Disp: , Rfl:    dapagliflozin  propanediol (FARXIGA ) 10 MG TABS tablet, Take 10 mg by mouth daily., Disp: , Rfl:    diphenoxylate -atropine  (LOMOTIL ) 2.5-0.025 MG tablet, Take 2 tablets by mouth 2 (two) times daily as needed for diarrhea or loose stools., Disp: 20 tablet, Rfl: 1   guaiFENesin  (MUCINEX ) 600 MG 12 hr tablet, Take  600 mg by mouth., Disp: , Rfl:    ipratropium (ATROVENT  HFA) 17 MCG/ACT inhaler, Inhale 2 puffs into the lungs every 8 (eight) hours., Disp: 1 each, Rfl: 12   metoprolol  succinate (TOPROL -XL) 25 MG 24 hr tablet, Take 1 tablet (25 mg total) by mouth daily., Disp: 30 tablet, Rfl: 0   montelukast  (SINGULAIR ) 10 MG tablet, Take 1 tablet (10 mg total) by mouth daily., Disp: 30 tablet, Rfl: 5   nitroGLYCERIN  (NITROSTAT ) 0.4 MG SL tablet, Place 0.4 mg under the tongue., Disp: , Rfl:    omeprazole (PRILOSEC) 20 MG capsule, Take 20 mg by mouth at bedtime., Disp: , Rfl:    OXYGEN , Inhale 2 L into the lungs every evening. Via cpap, Disp: , Rfl:    roflumilast (DALIRESP) 500 MCG TABS tablet, Take 500 mcg by mouth daily., Disp: , Rfl:    rosuvastatin  (CRESTOR ) 40 MG tablet, Take 1 tablet by mouth once daily, Disp: 90 tablet, Rfl: 3   sacubitril -valsartan  (ENTRESTO ) 97-103 MG, Take 1 tablet by mouth 2 (two) times daily., Disp: , Rfl:    [Paused] spironolactone  (ALDACTONE ) 25 MG tablet, Take 12.5 mg by mouth daily. Half tablet, Disp: , Rfl:    torsemide (DEMADEX) 20 MG tablet, Take 40 mg by mouth daily., Disp: , Rfl:  venlafaxine  XR (EFFEXOR -XR) 150 MG 24 hr capsule, TAKE 1 CAPSULE BY MOUTH ONCE DAILY WITH 75MG  DAILY, Disp: 90 capsule, Rfl: 3 [2]  Social History Tobacco Use  Smoking Status Former   Current packs/day: 0.00   Average packs/day: 1 pack/day for 41.0 years (41.0 ttl pk-yrs)   Types: Cigars, Cigarettes   Quit date: 07/2023   Years since quitting: 1.1  Smokeless Tobacco Never  Tobacco Comments   smokes 1-2 cigars daily; quit cigs. 30+ years ago

## 2024-09-03 ENCOUNTER — Encounter

## 2024-09-03 DIAGNOSIS — I5022 Chronic systolic (congestive) heart failure: Secondary | ICD-10-CM | POA: Diagnosis not present

## 2024-09-03 NOTE — Progress Notes (Signed)
 Daily Session Note  Patient Details  Name: Dylan Lee MRN: [id number] Date of Birth: 05-Jul-1956 Referring Provider:   Flowsheet Row Cardiac Rehab from 08/27/2024 in [facility] Cardiac and Pulmonary Rehab  Referring Provider Florencio Kava, MD    Encounter Date: 09/03/2024  Check In:  Session Check In - 09/03/24 1348       Check-In   Supervising physician immediately available to respond to emergencies See telemetry face sheet for immediately available ER MD    Location [facility]-Cardiac & Pulmonary Rehab    Staff Present Maxon Burnell HECKLE, Exercise Physiologist;Rasheka Denard Tressa RN,BSN;Joseph Hood RCP,RRT,BSRT;Noah Tickle, [place], Exercise Physiologist    Virtual Visit No    Medication changes reported     No    Fall or balance concerns reported    No    Warm-up and Cool-down Performed on first and last piece of equipment    Resistance Training Performed Yes    VAD Patient? No    PAD/SET Patient? No      Pain Assessment   Currently in Pain? No/denies             Tobacco Use History[1]  Goals Met:  Independence with exercise equipment Exercise tolerated well No report of concerns or symptoms today Strength training completed today  Goals Unmet:  Not Applicable  Comments: Pt able to follow exercise prescription today without complaint.  Will continue to monitor for progression.    Dr. Oneil Pinal is Medical Director for [facility] Cardiac Rehabilitation.  Dr. Fuad Aleskerov is Medical Director for [facility] Pulmonary Rehabilitation.    [1]  Social History Tobacco Use  Smoking Status Former   Current packs/day: 0.00   Average packs/day: 1 pack/day for 41.0 years (41.0 ttl pk-yrs)   Types: Cigars, Cigarettes   Quit date: 07/2023   Years since quitting: 1.1  Smokeless Tobacco Never  Tobacco Comments   smokes 1-2 cigars daily; quit cigs. 30+ years ago

## 2024-09-07 ENCOUNTER — Encounter: Admitting: Emergency Medicine

## 2024-09-07 DIAGNOSIS — I5022 Chronic systolic (congestive) heart failure: Secondary | ICD-10-CM | POA: Diagnosis not present

## 2024-09-07 NOTE — Progress Notes (Signed)
 Daily Session Note  Patient Details  Name: Dylan Lee MRN: [id number] Date of Birth: 1956/06/27 Referring Provider:   Flowsheet Row Cardiac Rehab from 08/27/2024 in [facility] Cardiac and Pulmonary Rehab  Referring Provider Florencio Kava, MD    Encounter Date: 09/07/2024  Check In:  Session Check In - 09/07/24 1423       Check-In   Supervising physician immediately available to respond to emergencies See telemetry face sheet for immediately available ER MD    Location [facility]-Cardiac & Pulmonary Rehab    Staff Present Leita Franks RN,BSN;Kristen Coble RN,BC,MSN;Margaret Best, MS, Exercise Physiologist;Noah Tickle, BS, Exercise Physiologist    Virtual Visit No    Medication changes reported     No    Fall or balance concerns reported    No    Warm-up and Cool-down Performed on first and last piece of equipment    Resistance Training Performed Yes    VAD Patient? No    PAD/SET Patient? No      Pain Assessment   Currently in Pain? No/denies             Tobacco Use History[1]  Goals Met:  Independence with exercise equipment Exercise tolerated well No report of concerns or symptoms today Strength training completed today  Goals Unmet:  Not Applicable  Comments: Pt able to follow exercise prescription today without complaint.  Will continue to monitor for progression.    Dr. Oneil Pinal is Medical Director for [facility] Cardiac Rehabilitation.  Dr. Fuad Aleskerov is Medical Director for [facility] Pulmonary Rehabilitation.    [1]  Social History Tobacco Use  Smoking Status Former   Current packs/day: 0.00   Average packs/day: 1 pack/day for 41.0 years (41.0 ttl pk-yrs)   Types: Cigars, Cigarettes   Quit date: 07/2023   Years since quitting: 1.1  Smokeless Tobacco Never  Tobacco Comments   smokes 1-2 cigars daily; quit cigs. 30+ years ago

## 2024-09-09 ENCOUNTER — Encounter

## 2024-09-14 ENCOUNTER — Encounter

## 2024-09-16 ENCOUNTER — Encounter

## 2024-09-17 ENCOUNTER — Ambulatory Visit
Admission: EM | Admit: 2024-09-17 | Discharge: 2024-09-17 | Disposition: A | Discharge status: Home / Self Care | Payer: Medicare (Managed Care) | Source: Home / Self Care

## 2024-09-17 ENCOUNTER — Encounter: Payer: Self-pay | Admitting: Emergency Medicine

## 2024-09-17 DIAGNOSIS — J069 Acute upper respiratory infection, unspecified: Secondary | ICD-10-CM | POA: Diagnosis not present

## 2024-09-17 DIAGNOSIS — R051 Acute cough: Secondary | ICD-10-CM | POA: Diagnosis not present

## 2024-09-17 LAB — POCT RESPIRATORY SYNCYTIAL VIRUS: RSV Antigen, POC: NEGATIVE

## 2024-09-17 LAB — POCT INFLUENZA A/B
Influenza A, POC: NEGATIVE
Influenza B, POC: NEGATIVE

## 2024-09-17 MED ORDER — IPRATROPIUM BROMIDE 0.06 % NA SOLN: 2.0000 | Freq: Four times a day (QID) | NASAL | 12 refills | Status: AC

## 2024-09-17 MED ORDER — PROMETHAZINE-DM 6.25-15 MG/5ML PO SYRP: 5.0000 mL | ORAL_SOLUTION | Freq: Four times a day (QID) | ORAL | 0 refills | Status: AC | PRN

## 2024-09-17 MED ORDER — BENZONATATE 100 MG PO CAPS: 200.0000 mg | ORAL_CAPSULE | Freq: Three times a day (TID) | ORAL | 0 refills | Status: AC

## 2024-09-17 NOTE — ED Provider Notes (Signed)
 " [facility]    CSN: [id number] Arrival date & time: 09/17/24  1252      History   Chief Complaint Chief Complaint  Patient presents with   Cough   Nasal Congestion    HPI Dylan Lee is a 69 y.o. male.   HPI  69 year old male with past medical history significant for CHF, dilated cardiomyopathy, RA, sleep apnea, chronic hepatitis C, GERD, and COPD presents for evaluation of respiratory symptoms that began 2 days ago that include runny nose, nasal congestion, postnasal drip for clear nasal discharge, and a productive cough of clear sputum.  No fever.  No shortness of breath or wheezing above baseline.  Past Medical History:  Diagnosis Date   Arthritis    CHF (congestive heart failure) (HCC)    COPD (chronic obstructive pulmonary disease) (HCC)    Depression    Encephalopathy due to COVID-19 virus 09/07/2021   GERD (gastroesophageal reflux disease)    Hepatitis C    Hep C treated    History of chicken pox    History of measles    History of mumps    Hyperlipidemia    Hypertension    Kidney stones    Oxygen  deficiency    Pneumonia due to COVID-19 virus 09/07/2021   Shingles 09/06/2015   Sleep apnea     Patient Active Problem List   Diagnosis Date Noted   V-tach [facility]) 01/31/2024   Presence of heart assist device (HCC) 01/08/2024   Change in bowel function 01/08/2024   Encounter for screening colonoscopy 08/07/2023   Aneurysm of ascending aorta without rupture 10/06/2021   Chronic kidney disease, stage 3b (HCC) 09/07/2021   Encounter for long-term (current) use of high-risk medication 08/31/2019   ANA positive 07/15/2019   Psoriatic arthritis (HCC) 07/15/2019   History of adenomatous polyp of colon 01/14/2019   Dyslipidemia (high LDL; low HDL) 03/28/2018   CAD (coronary artery disease) 03/27/2018   Cardiac defibrillator in situ 12/06/2015   Urinary incontinence 12/05/2015   Allergic rhinitis 09/06/2015   Kidney stones 09/06/2015   Erectile  dysfunction 09/06/2015   DOE (dyspnea on exertion) 09/06/2015   COPD (chronic obstructive pulmonary disease) (HCC) 09/06/2015   Chronic systolic heart failure (HCC) 03/02/2015   Congestive dilated cardiomyopathy (HCC) 03/02/2015   Pre-diabetes 01/14/2010   Tinnitus 06/24/2009   Plaque psoriasis 04/15/2007   External hemorrhoids without complication 04/15/2007   Sleep apnea 04/07/2007   Chronic hepatitis C without hepatic coma (HCC) 04/07/2007   Rheumatoid arthritis (HCC) 03/24/2007   Primary hypertension 03/24/2007   Esophageal reflux 03/24/2007   Depression, major, single episode, moderate (HCC) 03/24/2007   Asthma 03/24/2007   Tobacco use 03/24/2007    Past Surgical History:  Procedure Laterality Date   BUNIONECTOMY     CARDIAC CATHETERIZATION N/A 03/02/2015   Procedure: Right and Left Heart Cath;  Surgeon: Cara JONETTA Lovelace, MD;  Location: [facility] INVASIVE CV LAB;  Service: Cardiovascular;  Laterality: N/A;   COLONOSCOPY WITH PROPOFOL  N/A 08/07/2023   Procedure: COLONOSCOPY WITH PROPOFOL ;  Surgeon: Unk Corinn Skiff, MD;  Location: [facility] ENDOSCOPY;  Service: Gastroenterology;  Laterality: N/A;   DENTAL SURGERY     FOOT SURGERY Right    HEMOSTASIS CLIP PLACEMENT  08/07/2023   Procedure: HEMOSTASIS CLIP PLACEMENT;  Surgeon: Unk Corinn Skiff, MD;  Location: [facility] ENDOSCOPY;  Service: Gastroenterology;;   HOT HEMOSTASIS  08/07/2023   Procedure: HOT HEMOSTASIS (ARGON PLASMA COAGULATION/BICAP);  Surgeon: Unk Corinn Skiff, MD;  Location: [facility] ENDOSCOPY;  Service: Gastroenterology;;   IMPLANTABLE CARDIOVERTER DEFIBRILLATOR (ICD) GENERATOR CHANGE Right 06/03/2015   Procedure: ICD generator insertion ;  Surgeon: Franky LITTIE Ned, MD;  Location: [facility] ORS;  Service: Cardiovascular;  Laterality: Right;   LEFT HEART CATH AND CORONARY ANGIOGRAPHY Left 07/02/2024   Procedure: LEFT HEART CATH AND CORONARY ANGIOGRAPHY;  Surgeon: Florencio Cara BIRCH, MD;  Location: [facility] INVASIVE CV LAB;  Service:  Cardiovascular;  Laterality: Left;   LITHOTRIPSY     POLYPECTOMY  08/07/2023   Procedure: POLYPECTOMY;  Surgeon: Unk Corinn Skiff, MD;  Location: [facility] ENDOSCOPY;  Service: Gastroenterology;;   sleep study  07/2010   severe OSA, snoring. Bilevel pressure at 19/14. Jiengel. ENT consult/ Jiengel: no ENT surgical indication for OSA   SPIROMETRY  10/20/2014   Severe obstruction       Home Medications    Prior to Admission medications  Medication Sig Start Date End Date Taking? Authorizing Provider  benzonatate (TESSALON) 100 MG capsule Take 2 capsules (200 mg total) by mouth every 8 (eight) hours. 09/17/24  Yes Bernardino Ditch, NP  ipratropium (ATROVENT ) 0.06 % nasal spray Place 2 sprays into both nostrils 4 (four) times daily. 09/17/24  Yes Bernardino Ditch, NP  promethazine-dextromethorphan  (PROMETHAZINE-DM) 6.25-15 MG/5ML syrup Take 5 mLs by mouth 4 (four) times daily as needed. 09/17/24  Yes Bernardino Ditch, NP  albuterol  (VENTOLIN  HFA) 108 (90 Base) MCG/ACT inhaler Inhale 2 puffs into the lungs every 6 (six) hours as needed for wheezing or shortness of breath.    [provider]  amiodarone  (PACERONE ) 200 MG tablet Take 200 mg by mouth daily.    [provider]  aspirin  81 MG tablet Take 81 mg by mouth daily.    [provider]  clopidogrel (PLAVIX) 75 MG tablet Take 75 mg by mouth. 07/29/24 07/29/25  [provider]  dapagliflozin  propanediol (FARXIGA ) 10 MG TABS tablet Take 10 mg by mouth daily.    Callwood, Dwayne D, MD  diphenoxylate -atropine  (LOMOTIL ) 2.5-0.025 MG tablet Take 2 tablets by mouth 2 (two) times daily as needed for diarrhea or loose stools. 01/08/24   Gasper Nancyann BRAVO, MD  guaiFENesin  (MUCINEX ) 600 MG 12 hr tablet Take 600 mg by mouth.    [provider]  ipratropium (ATROVENT  HFA) 17 MCG/ACT inhaler Inhale 2 puffs into the lungs every 8 (eight) hours. 09/13/21   Amin, Sumayya, MD  metoprolol  succinate (TOPROL -XL) 25 MG 24 hr tablet Take 1  tablet (25 mg total) by mouth daily. 02/03/24   Alexander, Natalie, DO  montelukast  (SINGULAIR ) 10 MG tablet Take 1 tablet (10 mg total) by mouth daily. 07/12/24   Gasper Nancyann BRAVO, MD  nitroGLYCERIN  (NITROSTAT ) 0.4 MG SL tablet Place 0.4 mg under the tongue. 07/28/24 07/28/25  [provider]  omeprazole (PRILOSEC) 20 MG capsule Take 20 mg by mouth at bedtime.    [provider]  OXYGEN  Inhale 2 L into the lungs every evening. Via cpap    [provider]  roflumilast (DALIRESP) 500 MCG TABS tablet Take 500 mcg by mouth daily.    [provider]  rosuvastatin  (CRESTOR ) 40 MG tablet Take 1 tablet by mouth once daily 11/17/23   Gasper Nancyann BRAVO, MD  sacubitril -valsartan  (ENTRESTO ) 97-103 MG Take 1 tablet by mouth 2 (two) times daily.    Callwood, Dwayne D, MD  [Paused] spironolactone  (ALDACTONE ) 25 MG tablet Take 12.5 mg by mouth daily. Half tablet Wait to take this until your doctor or other care provider tells you to start again.  [provider]  torsemide (DEMADEX) 20 MG tablet Take 40 mg by mouth daily. 06/11/19   [provider]  venlafaxine  XR (EFFEXOR -XR) 150 MG 24 hr capsule TAKE 1 CAPSULE BY MOUTH ONCE DAILY WITH 75MG  DAILY 02/24/24   Gasper Nancyann BRAVO, MD    Family History Family History  Problem Relation Age of Onset   Hypertension Mother    Bipolar disorder Mother    Ovarian cancer Mother    Heart disease Mother    Depression Mother    Diabetes Father    Alcohol abuse Father    CAD Father    Depression Father    Heart disease Father    Alcohol abuse Sister    Congestive Heart Failure Brother    Alcohol abuse Brother    Liver cancer Brother    Alcohol abuse Brother     Social History Social History[1]   Allergies   Quinolones   Review of Systems Review of Systems  Constitutional:  Negative for fever.  HENT:  Positive for congestion, postnasal drip and rhinorrhea. Negative for ear pain.   Respiratory:  Positive  for cough. Negative for shortness of breath and wheezing.      Physical Exam Triage Vital Signs ED Triage Vitals  Encounter Vitals Group     BP      Girls Systolic BP Percentile      Girls Diastolic BP Percentile      Boys Systolic BP Percentile      Boys Diastolic BP Percentile      Pulse      Resp      Temp      Temp src      SpO2      Weight      Height      Head Circumference      Peak Flow      Pain Score      Pain Loc      Pain Education      Exclude from Growth Chart    No data found.  Updated Vital Signs BP 106/72 (BP Location: Left Arm)   Pulse 97   Temp 97.9 F (36.6 C) (Oral)   Resp 18   Wt 214 lb (97.1 kg)   SpO2 91%   BMI 31.51 kg/m   Visual Acuity Right Eye Distance:   Left Eye Distance:   Bilateral Distance:    Right Eye Near:   Left Eye Near:    Bilateral Near:     Physical Exam Vitals and nursing note reviewed.  Constitutional:      Appearance: Normal appearance. He is not ill-appearing.  HENT:     Head: Normocephalic and atraumatic.     Nose: Congestion and rhinorrhea present.     Comments: Nasal mucosa is erythematous mildly edematous with scant clear discharge in both nares.    Mouth/Throat:     Mouth: Mucous membranes are moist.     Pharynx: Oropharynx is clear. Posterior oropharyngeal erythema present. No oropharyngeal exudate.     Comments: Mild erythema to the posterior pharynx with clear postnasal drip. Cardiovascular:     Rate and Rhythm: Normal rate and regular rhythm.     Pulses: Normal pulses.     Heart sounds: Normal heart sounds. No murmur heard.    No friction rub. No gallop.  Pulmonary:     Effort: Pulmonary effort is normal.     Breath sounds: Normal breath sounds. No wheezing, rhonchi or rales.  Skin:  General: Skin is warm and dry.     Capillary Refill: Capillary refill takes less than 2 seconds.     Findings: No rash.  Neurological:     General: No focal deficit present.     Mental Status: He is alert  and oriented to person, place, and time.      UC Treatments / Results  Labs (all labs ordered are listed, but only abnormal results are displayed) Labs Reviewed  POCT INFLUENZA A/B - Normal  POCT RESPIRATORY SYNCYTIAL VIRUS - Normal    EKG   Radiology No results found.  Procedures Procedures (including critical care time)  Medications Ordered in UC Medications - No data to display  Initial Impression / Assessment and Plan / UC Course  I have reviewed the triage vital signs and the nursing notes.  Pertinent labs & imaging results that were available during my care of the patient were reviewed by me and considered in my medical decision making (see chart for details).   Patient is a nontoxic-appearing 69 year old male presenting for evaluation of 2 days with respiratory symptoms as outlined in HPI above.  He denies any known sick contacts or recent travel to state or country.  He reports that last time he had similar symptoms he went up in the hospital.  He has been using his inhaler for shortness breath and wheezing but denies any shortness of breath or wheezing over his baseline.  His sputum production and nasal discharge are all clear.  Given the volume of fluid in the community I will order a flu antigen test.  I will also order an RSV antigen test.  Influenza antigen test is negative.  RSV antigen test is negative.  I will discharge patient home with diagnosis of viral URI with a cough.  Prescribe Atrovent  nasal spray for nasal congestion.  Tessalon Perles and Promethazine DM cough syrup for cough and congestion.  He should continue to use his albuterol  inhaler as needed for any shortness breath or wheezing.  Return precautions reviewed.   Final Clinical Impressions(s) / UC Diagnoses   Final diagnoses:  Acute cough  Viral URI with cough     Discharge Instructions      Your testing today was negative for both influenza and RSV.  Your exam is consistent with a viral  respiratory infection.  Continue to use your albuterol  inhaler as needed for shortness of breath or wheezing.  Use the Atrovent  nasal spray, 2 squirts in each nostril every 6 hours, as needed for runny nose and postnasal drip.  Use the Tessalon Perles every 8 hours during the day.  Take them with a small sip of water .  They may give you some numbness to the base of your tongue or a metallic taste in your mouth, this is normal.  Use the Promethazine DM cough syrup at bedtime for cough and congestion.  It will make you drowsy so do not take it during the day.  Return for reevaluation or see your primary care provider for any new or worsening symptoms.  If you develop any shortness of breath that is not relieved by your inhaler, chest pain, or pink frothy sputum production you need to call 911 and go to the ER.     ED Prescriptions     Medication Sig Dispense Auth. Provider   benzonatate (TESSALON) 100 MG capsule Take 2 capsules (200 mg total) by mouth every 8 (eight) hours. 21 capsule Bernardino Ditch, NP   ipratropium (ATROVENT ) 0.06 %  nasal spray Place 2 sprays into both nostrils 4 (four) times daily. 15 mL Bernardino Ditch, NP   promethazine-dextromethorphan  (PROMETHAZINE-DM) 6.25-15 MG/5ML syrup Take 5 mLs by mouth 4 (four) times daily as needed. 118 mL Bernardino Ditch, NP      PDMP not reviewed this encounter.     [1]  Social History Tobacco Use   Smoking status: Former    Current packs/day: 0.00    Average packs/day: 1 pack/day for 41.0 years (41.0 ttl pk-yrs)    Types: Cigars, Cigarettes    Quit date: 07/2023    Years since quitting: 1.1   Smokeless tobacco: Never   Tobacco comments:    smokes 1-2 cigars daily; quit cigs. 30+ years ago  Vaping Use   Vaping status: Never Used  Substance Use Topics   Alcohol use: No    Alcohol/week: 0.0 standard drinks of alcohol   Drug use: Yes     Bernardino Ditch, NP 09/17/24 1433  "

## 2024-09-17 NOTE — Discharge Instructions (Addendum)
 Your testing today was negative for both influenza and RSV.  Your exam is consistent with a viral respiratory infection.  Continue to use your albuterol  inhaler as needed for shortness of breath or wheezing.  Use the Atrovent  nasal spray, 2 squirts in each nostril every 6 hours, as needed for runny nose and postnasal drip.  Use the Tessalon Perles every 8 hours during the day.  Take them with a small sip of water .  They may give you some numbness to the base of your tongue or a metallic taste in your mouth, this is normal.  Use the Promethazine DM cough syrup at bedtime for cough and congestion.  It will make you drowsy so do not take it during the day.  Return for reevaluation or see your primary care provider for any new or worsening symptoms.  If you develop any shortness of breath that is not relieved by your inhaler, chest pain, or pink frothy sputum production you need to call 911 and go to the ER.

## 2024-09-17 NOTE — ED Triage Notes (Signed)
 Pt c/o cough and sinus drainage x 2 days. Pt has taken nasal spray that was called in by his pulmonologist a few days ago.

## 2024-09-23 ENCOUNTER — Encounter: Payer: Medicare (Managed Care) | Attending: Internal Medicine

## 2024-09-23 DIAGNOSIS — I5022 Chronic systolic (congestive) heart failure: Secondary | ICD-10-CM | POA: Insufficient documentation

## 2024-09-24 ENCOUNTER — Encounter: Payer: Medicare (Managed Care) | Admitting: *Deleted

## 2024-09-28 ENCOUNTER — Telehealth: Payer: Self-pay | Admitting: *Deleted

## 2024-09-28 NOTE — Telephone Encounter (Signed)
 Spoke with Dylan Lee because he has been out sick for several weeks. He is working with his doctor/new meds to try to get better. Wants to be on med hold til 10/02/24. He was put on wait list and his class time will not be held. He stated he would call a few days before his return date to see what class times are available.

## 2024-09-30 DIAGNOSIS — I5022 Chronic systolic (congestive) heart failure: Secondary | ICD-10-CM

## 2024-09-30 NOTE — Progress Notes (Signed)
 Cardiac Individual Treatment Plan  Patient Details  Name: Dylan Lee MRN: [id number] Date of Birth: Apr 02, 1956 Referring Provider:   Flowsheet Row Cardiac Rehab from 08/27/2024 in [facility] Cardiac and Pulmonary Rehab  Referring Provider Florencio Kava, MD    Initial Encounter Date:  Flowsheet Row Cardiac Rehab from 08/27/2024 in [facility] Cardiac and Pulmonary Rehab  Date 08/27/24    Visit Diagnosis: Heart failure, chronic systolic (HCC)  Patient's Home Medications on Admission: Current Medications[1]  Past Medical History: Past Medical History:  Diagnosis Date   Arthritis    CHF (congestive heart failure) (HCC)    COPD (chronic obstructive pulmonary disease) (HCC)    Depression    Encephalopathy due to COVID-19 virus 09/07/2021   GERD (gastroesophageal reflux disease)    Hepatitis C    Hep C treated    History of chicken pox    History of measles    History of mumps    Hyperlipidemia    Hypertension    Kidney stones    Oxygen  deficiency    Pneumonia due to COVID-19 virus 09/07/2021   Shingles 09/06/2015   Sleep apnea     Tobacco Use: Tobacco Use History[2]  Labs: Review Flowsheet  More data exists      Latest Ref Rng & Units 10/06/2021 05/07/2022 01/08/2023 07/10/2023 02/01/2024  Labs for ITP Cardiac and Pulmonary Rehab  Cholestrol 0 - 200 mg/dL - 848  - 869  873   LDL (calc) 0 - 99 mg/dL - 91  - 70  69   HDL-C >40 mg/dL - 33  - 31  36   Trlycerides <150 mg/dL - 849  - 827  893   Hemoglobin A1c 4.8 - 5.6 % 6.2  5.9  5.5  5.7  -     Exercise Target Goals: Exercise Program Goal: Individual exercise prescription set using results from initial 6 min walk test and THRR while considering  patients activity barriers and safety.   Exercise Prescription Goal: Initial exercise prescription builds to 30-45 minutes a day of aerobic activity, 2-3 days per week.  Home exercise guidelines will be given to patient during program as part of exercise prescription that the  participant will acknowledge.   Education: Aerobic Exercise: - Group verbal and visual presentation on the components of exercise prescription. Introduces F.I.T.T principle from ACSM for exercise prescriptions.  Reviews F.I.T.T. principles of aerobic exercise including progression. Written material provided at class time. Flowsheet Row Cardiac Rehab from 09/02/2024 in [facility] Cardiac and Pulmonary Rehab  Education need identified 08/27/24    Education: Resistance Exercise: - Group verbal and visual presentation on the components of exercise prescription. Introduces F.I.T.T principle from ACSM for exercise prescriptions  Reviews F.I.T.T. principles of resistance exercise including progression. Written material provided at class time. Flowsheet Row Cardiac Rehab from 09/02/2024 in [facility] Cardiac and Pulmonary Rehab  Education need identified 08/27/24  Date 09/02/24  Educator kh  Instruction Review Code 1- [employer] Understanding     Education: Exercise & Equipment Safety: - Individual verbal instruction and demonstration of equipment use and safety with use of the equipment. Flowsheet Row Cardiac Rehab from 09/02/2024 in [facility] Cardiac and Pulmonary Rehab  Date 08/27/24  Educator MB  Instruction Review Code 1- Verbalizes Understanding    Education: Exercise Physiology & General Exercise Guidelines: - Group verbal and written instruction with models to review the exercise physiology of the cardiovascular system and associated critical values. Provides general exercise guidelines with specific guidelines to those with heart or lung  disease. Written material provided at class time.   Education: Flexibility, Balance, Mind/Body Relaxation: - Group verbal and visual presentation with interactive activity on the components of exercise prescription. Introduces F.I.T.T principle from ACSM for exercise prescriptions. Reviews F.I.T.T. principles of flexibility and balance exercise training including  progression. Also discusses the mind body connection.  Reviews various relaxation techniques to help reduce and manage stress (i.e. Deep breathing, progressive muscle relaxation, and visualization). Balance handout provided to take home. Written material provided at class time. Flowsheet Row Cardiac Rehab from 09/02/2024 in [facility] Cardiac and Pulmonary Rehab  Date 09/02/24  Educator kh  Instruction Review Code 1- Verbalizes Understanding    Activity Barriers & Risk Stratification:  Activity Barriers & Cardiac Risk Stratification - 08/27/24 1645       Activity Barriers & Cardiac Risk Stratification   Activity Barriers Arthritis;Joint Problems;Balance Concerns;Shortness of Breath    Cardiac Risk Stratification High          6 Minute Walk:  6 Minute Walk     Row Name 08/27/24 1644         6 Minute Walk   Phase Initial     Distance 380 feet     Walk Time 4.77 minutes     # of Rest Breaks 2     MPH 0.91     METS 1.1     RPE 13     Perceived Dyspnea  3     VO2 Peak 3.86     Symptoms Yes (comment)     Comments SOB     Resting HR 62 bpm     Resting BP 110/74     Resting Oxygen  Saturation  95 %     Exercise Oxygen  Saturation  during 6 min walk 95 %     Max Ex. HR 105 bpm     Max Ex. BP 120/84     2 Minute Post BP 104/78        Oxygen  Initial Assessment:   Oxygen  Re-Evaluation:   Oxygen  Discharge (Final Oxygen  Re-Evaluation):   Initial Exercise Prescription:  Initial Exercise Prescription - 08/27/24 1600       Date of Initial Exercise RX and Referring Provider   Date 08/27/24    Referring Provider Florencio Kava, MD      Oxygen    Maintain Oxygen  Saturation 88% or higher      Treadmill   MPH 0.6    Grade 0    Minutes 15    METs 1.1      Recumbant Bike   Level 1    RPM 50    Watts 15    Minutes 15    METs 1.1      NuStep   Level 1   T4 and T6   SPM 80    Minutes 15    METs 1.1      T5 Nustep   Level 1    SPM 80    Minutes 15    METs 1.1       Biostep-RELP   Level 1    SPM 50    Minutes 15    METs 1.1      Track   Laps 9    Minutes 15    METs 1.49      Prescription Details   Frequency (times per week) 3    Duration Progress to 30 minutes of continuous aerobic without signs/symptoms of physical distress      Intensity   THRR  40-80% of Max Heartrate 97-133    Ratings of Perceived Exertion 11-13    Perceived Dyspnea 0-4      Progression   Progression Continue to progress workloads to maintain intensity without signs/symptoms of physical distress.      Resistance Training   Training Prescription Yes    Weight 5 lb    Reps 10-15          Perform Capillary Blood Glucose checks as needed.  Exercise Prescription Changes:   Exercise Prescription Changes     Row Name 08/27/24 1600 09/07/24 0900 09/23/24 1500         Response to Exercise   Blood Pressure (Admit) 110/74 110/64 116/64     Blood Pressure (Exercise) 120/84 132/78 136/70     Blood Pressure (Exit) 104/78 120/60 118/62     Heart Rate (Admit) 62 bpm 63 bpm 63 bpm     Heart Rate (Exercise) 105 bpm 106 bpm 103 bpm     Heart Rate (Exit) 68 bpm 76 bpm 62 bpm     Oxygen  Saturation (Admit) 95 % -- --     Oxygen  Saturation (Exercise) 95 % -- --     Oxygen  Saturation (Exit) 94 % -- --     Rating of Perceived Exertion (Exercise) 1 13 13      Perceived Dyspnea (Exercise) 3 -- --     Symptoms SOB none none     Comments results 1st week of exercise sessions --     Duration -- Progress to 30 minutes of  aerobic without signs/symptoms of physical distress Progress to 30 minutes of  aerobic without signs/symptoms of physical distress     Intensity -- THRR unchanged THRR unchanged       Progression   Progression -- Continue to progress workloads to maintain intensity without signs/symptoms of physical distress. Continue to progress workloads to maintain intensity without signs/symptoms of physical distress.     Average METs 1.1 2.26 1.5        Resistance Training   Training Prescription -- Yes --     Weight -- 5 lb 5 lb     Reps -- 10-15 10-15       Interval Training   Interval Training -- No No       Recumbant Bike   Level -- 1 --     Watts -- 22 --     Minutes -- 15 --     METs -- 2.49 --       NuStep   Level -- 1 --     Minutes -- 15 --     METs -- 2.3 --       Biostep-RELP   Level -- 1 1     Minutes -- 15 15     METs -- 2 2       Oxygen    Maintain Oxygen  Saturation -- 88% or higher 88% or higher        Exercise Comments:   Exercise Comments     Row Name 08/31/24 1409           Exercise Comments First full day of exercise!  Patient was oriented to gym and equipment including functions, settings, policies, and procedures.  Patient's individual exercise prescription and treatment plan were reviewed.  All starting workloads were established based on the results of the 6 minute walk test done at initial orientation visit.  The plan for exercise progression was also introduced and progression will be customized based  on patient's performance and goals.          Exercise Goals and Review:   Exercise Goals     Row Name 08/27/24 1648             Exercise Goals   Increase Physical Activity Yes       Intervention Provide advice, education, support and counseling about physical activity/exercise needs.;Develop an individualized exercise prescription for aerobic and resistive training based on initial evaluation findings, risk stratification, comorbidities and participant's personal goals.       Expected Outcomes Long Term: Add in home exercise to make exercise part of routine and to increase amount of physical activity.;Short Term: Attend rehab on a regular basis to increase amount of physical activity.;Long Term: Exercising regularly at least 3-5 days a week.       Increase Strength and Stamina Yes       Intervention Provide advice, education, support and counseling about physical activity/exercise  needs.;Develop an individualized exercise prescription for aerobic and resistive training based on initial evaluation findings, risk stratification, comorbidities and participant's personal goals.       Expected Outcomes Short Term: Increase workloads from initial exercise prescription for resistance, speed, and METs.;Short Term: Perform resistance training exercises routinely during rehab and add in resistance training at home;Long Term: Improve cardiorespiratory fitness, muscular endurance and strength as measured by increased METs and functional capacity ( )       Able to understand and use rate of perceived exertion (RPE) scale Yes       Intervention Provide education and explanation on how to use RPE scale       Expected Outcomes Short Term: Able to use RPE daily in rehab to express subjective intensity level;Long Term:  Able to use RPE to guide intensity level when exercising independently       Able to understand and use Dyspnea scale Yes       Intervention Provide education and explanation on how to use Dyspnea scale       Expected Outcomes Short Term: Able to use Dyspnea scale daily in rehab to express subjective sense of shortness of breath during exertion;Long Term: Able to use Dyspnea scale to guide intensity level when exercising independently       Knowledge and understanding of Target Heart Rate Range (THRR) Yes       Intervention Provide education and explanation of THRR including how the numbers were predicted and where they are located for reference       Expected Outcomes Short Term: Able to state/look up THRR;Short Term: Able to use daily as guideline for intensity in rehab;Long Term: Able to use THRR to govern intensity when exercising independently       Able to check pulse independently Yes       Intervention Provide education and demonstration on how to check pulse in carotid and radial arteries.;Review the importance of being able to check your own pulse for safety during  independent exercise       Expected Outcomes Short Term: Able to explain why pulse checking is important during independent exercise;Long Term: Able to check pulse independently and accurately       Understanding of Exercise Prescription Yes       Intervention Provide education, explanation, and written materials on patient's individual exercise prescription       Expected Outcomes Short Term: Able to explain program exercise prescription;Long Term: Able to explain home exercise prescription to exercise independently  Exercise Goals Re-Evaluation :  Exercise Goals Re-Evaluation     Row Name 08/31/24 1409 09/07/24 0952 09/23/24 1555         Exercise Goal Re-Evaluation   Exercise Goals Review Increase Physical Activity;Able to understand and use rate of perceived exertion (RPE) scale;Knowledge and understanding of Target Heart Rate Range (THRR);Understanding of Exercise Prescription;Increase Strength and Stamina;Able to understand and use Dyspnea scale;Able to check pulse independently Increase Physical Activity;Understanding of Exercise Prescription;Increase Strength and Stamina Increase Physical Activity;Understanding of Exercise Prescription;Increase Strength and Stamina     Comments Reviewed RPE and dyspnea scale, THR and program prescription with pt today.  Pt voiced understanding and was given a copy of goals to take home. Dylan Lee is off to a good start in the program and completed his first week in this review period. He worked at level 1 on the biostep, T4 nustep, and recumbent bike. We will continue to monitor his progress in the program. Si is doing well in rehab and was only able to attend one session during this review period. During his one session he was able to use the biostep at level 1. We will continue to monitor his progress in the program.     Expected Outcomes Short: Use RPE daily to regulate intensity.  Long: Follow program prescription in THR. Short: Continue to follow  current exercise prescription. Long: Continue exercise to improve strength and stamina. Short: Attend rehab more frequently. Long: Continue exercise to improve strength and stamina.        Discharge Exercise Prescription (Final Exercise Prescription Changes):  Exercise Prescription Changes - 09/23/24 1500       Response to Exercise   Blood Pressure (Admit) 116/64    Blood Pressure (Exercise) 136/70    Blood Pressure (Exit) 118/62    Heart Rate (Admit) 63 bpm    Heart Rate (Exercise) 103 bpm    Heart Rate (Exit) 62 bpm    Rating of Perceived Exertion (Exercise) 13    Symptoms none    Duration Progress to 30 minutes of  aerobic without signs/symptoms of physical distress    Intensity THRR unchanged      Progression   Progression Continue to progress workloads to maintain intensity without signs/symptoms of physical distress.    Average METs 1.5      Resistance Training   Weight 5 lb    Reps 10-15      Interval Training   Interval Training No      Biostep-RELP   Level 1    Minutes 15    METs 2      Oxygen    Maintain Oxygen  Saturation 88% or higher          Nutrition:  Target Goals: Understanding of nutrition guidelines, daily intake of sodium 1500mg , cholesterol 200mg , calories 30% from fat and 7% or less from saturated fats, daily to have 5 or more servings of fruits and vegetables.  Education: Nutrition 1 -Group instruction provided by verbal, written material, interactive activities, discussions, models, and posters to present general guidelines for heart healthy nutrition including macronutrients, label reading, and promoting whole foods over processed counterparts. Education serves as pensions consultant of discussion of heart healthy eating for all. Written material provided at class time.    Education: Nutrition 2 -Group instruction provided by verbal, written material, interactive activities, discussions, models, and posters to present general guidelines for heart  healthy nutrition including sodium, cholesterol, and saturated fat. Providing guidance of habit forming to improve blood pressure, cholesterol, and  body weight. Written material provided at class time.     Biometrics:  Pre Biometrics - 08/27/24 1648       Pre Biometrics   Height 5' 9.1 (1.755 m)    Weight 213 lb 12.8 oz (97 kg)    Waist Circumference 46.1 inches    Hip Circumference 44.1 inches    Waist to Hip Ratio 1.05 %    BMI (Calculated) 31.49    Single Leg Stand 4.6 seconds           Nutrition Therapy Plan and Nutrition Goals:  Nutrition Therapy & Goals - 08/27/24 1649       Nutrition Therapy   RD appointment deferred Yes      Personal Nutrition Goals   Nutrition Goal RD appointment deferred at this time      Intervention Plan   Intervention Prescribe, educate and counsel regarding individualized specific dietary modifications aiming towards targeted core components such as weight, hypertension, lipid management, diabetes, heart failure and other comorbidities.    Expected Outcomes Short Term Goal: Understand basic principles of dietary content, such as calories, fat, sodium, cholesterol and nutrients.          Nutrition Assessments:  MEDIFICTS Score Key: >=70 Need to make dietary changes  40-70 Heart Healthy Diet <= 40 Therapeutic Level Cholesterol Diet  Flowsheet Row Cardiac Rehab from 08/21/2024 in [facility] Cardiac and Pulmonary Rehab  Picture Your Plate Total Score on Admission 41   Picture Your Plate Scores: <59 Unhealthy dietary pattern with much room for improvement. 41-50 Dietary pattern unlikely to meet recommendations for good health and room for improvement. 51-60 More healthful dietary pattern, with some room for improvement.  >60 Healthy dietary pattern, although there may be some specific behaviors that could be improved.    Nutrition Goals Re-Evaluation:   Nutrition Goals Discharge (Final Nutrition Goals  Re-Evaluation):   Psychosocial: Target Goals: Acknowledge presence or absence of significant depression and/or stress, maximize coping skills, provide positive support system. Participant is able to verbalize types and ability to use techniques and skills needed for reducing stress and depression.   Education: Stress, Anxiety, and Depression - Group verbal and visual presentation to define topics covered.  Reviews how body is impacted by stress, anxiety, and depression.  Also discusses healthy ways to reduce stress and to treat/manage anxiety and depression. Written material provided at class time. Flowsheet Row Cardiac Rehab from 09/02/2024 in [facility] Cardiac and Pulmonary Rehab  Date 08/27/24  Educator MB  Instruction Review Code 1- [employer] Understanding    Education: Sleep Hygiene -Provides group verbal and written instruction about how sleep can affect your health.  Define sleep hygiene, discuss sleep cycles and impact of sleep habits. Review good sleep hygiene tips.   Initial Review & Psychosocial Screening:  Initial Psych Review & Screening - 08/21/24 1339       Initial Review   Current issues with History of Depression;Current Psychotropic Meds;Current Stress Concerns    Source of Stress Concerns Unable to perform yard/household activities;Unable to participate in former interests or hobbies      Family Dynamics   Good Support System? Yes      Barriers   Psychosocial barriers to participate in program There are no identifiable barriers or psychosocial needs.;The patient should benefit from training in stress management and relaxation.      Screening Interventions   Interventions Encouraged to exercise;Provide feedback about the scores to participant;To provide support and resources with identified psychosocial needs    Expected  Outcomes Short Term goal: Utilizing psychosocial counselor, staff and physician to assist with identification of specific Stressors or current issues  interfering with healing process. Setting desired goal for each stressor or current issue identified.;Long Term Goal: Stressors or current issues are controlled or eliminated.;Short Term goal: Identification and review with participant of any Quality of Life or Depression concerns found by scoring the questionnaire.;Long Term goal: The participant improves quality of Life and PHQ9 Scores as seen by post scores and/or verbalization of changes          Quality of Life Scores:   Quality of Life - 08/21/24 1403       Quality of Life   Select Quality of Life      Quality of Life Scores   Health/Function Pre 8.37 %    Socioeconomic Pre 19.56 %    Psych/Spiritual Pre 10.29 %    Family Pre 22.2 %    GLOBAL Pre 13.29 %         Scores of 19 and below usually indicate a poorer quality of life in these areas.  A difference of  2-3 points is a clinically meaningful difference.  A difference of 2-3 points in the total score of the Quality of Life Index has been associated with significant improvement in overall quality of life, self-image, physical symptoms, and general health in studies assessing change in quality of life.  PHQ-9: Review Flowsheet  More data exists      08/27/2024 07/24/2024 01/08/2024 10/15/2023 01/08/2023  Depression screen PHQ 2/9  Decreased Interest 1 2 0 1 1  Down, Depressed, Hopeless 1 2 0 1 1  PHQ - 2 Score 2 4 0 2 2  Altered sleeping 1 1 0 0 2  Tired, decreased energy 2 1 0 1 1  Change in appetite 0 0 0 0 2  Feeling bad or failure about yourself  2 2 0 0 2  Trouble concentrating 0 2 0 0 1  Moving slowly or fidgety/restless 1 2 0 0 1  Suicidal thoughts 0 0 0 0 0  PHQ-9 Score 8 12 0  3  11   Difficult doing work/chores Very difficult Somewhat difficult - Not difficult at all Somewhat difficult    Details       Data saved with a previous flowsheet row definition        Interpretation of Total Score  Total Score Depression Severity:  1-4 = Minimal depression,  5-9 = Mild depression, 10-14 = Moderate depression, 15-19 = Moderately severe depression, 20-27 = Severe depression   Psychosocial Evaluation and Intervention:  Psychosocial Evaluation - 08/21/24 1401       Psychosocial Evaluation & Interventions   Interventions Relaxation education;Encouraged to exercise with the program and follow exercise prescription;Stress management education    Comments Mr. Filsinger is coming to cardiac rehab with heart failure. His wife reports that he is on medication for depression and feels like it is managing his symptoms. She states that some days he does get down when he is unable to do the things he used to. His symptoms have slowed him down and they are hopeful this program will boost his stamina.    Expected Outcomes Short: attend cardiac rehab for education and exercise Long: develop and maintain positive self care habits    Continue Psychosocial Services  Follow up required by staff          Psychosocial Re-Evaluation:   Psychosocial Discharge (Final Psychosocial Re-Evaluation):   Vocational Rehabilitation: Provide  vocational rehab assistance to qualifying candidates.   Vocational Rehab Evaluation & Intervention:  Vocational Rehab - 08/21/24 1339       Initial Vocational Rehab Evaluation & Intervention   Assessment shows need for Vocational Rehabilitation No          Education: Education Goals: Education classes will be provided on a variety of topics geared toward better understanding of heart health and risk factor modification. Participant will state understanding/return demonstration of topics presented as noted by education test scores.  Learning Barriers/Preferences:  Learning Barriers/Preferences - 08/21/24 1338       Learning Barriers/Preferences   Learning Barriers None    Learning Preferences Individual Instruction          General Cardiac Education Topics:  AED/CPR: - Group verbal and written instruction with the use  of models to demonstrate the basic use of the AED with the basic ABC's of resuscitation.   Test and Procedures: - Group verbal and visual presentation and models provide information about basic cardiac anatomy and function. Reviews the testing methods done to diagnose heart disease and the outcomes of the test results. Describes the treatment choices: Medical Management, Angioplasty, or Coronary Bypass Surgery for treating various heart conditions including Myocardial Infarction, Angina, Valve Disease, and Cardiac Arrhythmias. Written material provided at class time. Flowsheet Row Cardiac Rehab from 09/02/2024 in [facility] Cardiac and Pulmonary Rehab  Education need identified 08/27/24    Medication Safety: - Group verbal and visual instruction to review commonly prescribed medications for heart and lung disease. Reviews the medication, class of the drug, and side effects. Includes the steps to properly store meds and maintain the prescription regimen. Written material provided at class time.   Intimacy: - Group verbal instruction through game format to discuss how heart and lung disease can affect sexual intimacy. Written material provided at class time.   Know Your Numbers and Heart Failure: - Group verbal and visual instruction to discuss disease risk factors for cardiac and pulmonary disease and treatment options.  Reviews associated critical values for Overweight/Obesity, Hypertension, Cholesterol, and Diabetes.  Discusses basics of heart failure: signs/symptoms and treatments.  Introduces Heart Failure Zone chart for action plan for heart failure. Written material provided at class time.   Infection Prevention: - Provides verbal and written material to individual with discussion of infection control including proper hand washing and proper equipment cleaning during exercise session. Flowsheet Row Cardiac Rehab from 09/02/2024 in [facility]iac [facility]  Date 08/27/24  Educator MB   Instruction Review Code 1- Verbalizes Understanding    Falls Prevention: - Provides verbal and written material to individual with discussion of falls prevention and safety. Flowsheet Row Cardiac Rehab from 09/02/2024 in [facility] Card[facility] Re[facility]  Date 08/27/24  Educator MB  Instruction Review Code 1- Verbalizes Understanding    Other: -Provides group and verbal instruction on various topics (see comments)   Knowledge Questionnaire Score:  Knowledge Questionnaire Score - 08/21/24 1404       Knowledge Questionnaire Score   Pre Score 21/26          Core Components/Risk Factors/Patient Goals at Admission:  Personal Goals and Risk Factors at Admission - 08/27/24 1649       Core Components/Risk Factors/Patient Goals on Admission    Weight Management Yes;Weight Maintenance    Intervention Weight Management: Develop a combined nutrition and exercise program designed to reach desired caloric intake, while maintaining appropriate intake of nutrient and fiber, sodium and fats, and appropriate energy  expenditure required for the weight goal.;Weight Management: Provide education and appropriate resources to help participant work on and attain dietary goals.;Weight Management/Obesity: Establish reasonable short term and long term weight goals.    Admit Weight 213 lb 12.8 oz (97 kg)    Goal Weight: Short Term 213 lb 12.8 oz (97 kg)    Goal Weight: Long Term 213 lb 12.8 oz (97 kg)    Expected Outcomes Short Term: Continue to assess and modify interventions until short term weight is achieved;Long Term: Adherence to nutrition and physical activity/exercise program aimed toward attainment of established weight goal;Weight Maintenance: Understanding of the daily nutrition guidelines, which includes 25-35% calories from fat, 7% or less cal from saturated fats, less than 200mg  cholesterol, less than 1.5gm of sodium, & 5 or more servings of fruits and vegetables daily;Understanding  recommendations for meals to include 15-35% energy as protein, 25-35% energy from fat, 35-60% energy from carbohydrates, less than 200mg  of dietary cholesterol, 20-35 gm of total fiber daily;Understanding of distribution of calorie intake throughout the day with the consumption of 4-5 meals/snacks    Heart Failure Yes    Intervention Provide a combined exercise and nutrition program that is supplemented with education, support and counseling about heart failure. Directed toward relieving symptoms such as shortness of breath, decreased exercise tolerance, and extremity edema.    Expected Outcomes Improve functional capacity of life;Short term: Daily weights obtained and reported for increase. Utilizing diuretic protocols set by physician.;Short term: Attendance in program 2-3 days a week with increased exercise capacity. Reported lower sodium intake. Reported increased fruit and vegetable intake. Reports medication compliance.;Long term: Adoption of self-care skills and reduction of barriers for early signs and symptoms recognition and intervention leading to self-care maintenance.    Hypertension Yes    Intervention Provide education on lifestyle modifcations including regular physical activity/exercise, weight management, moderate sodium restriction and increased consumption of fresh fruit, vegetables, and low fat dairy, alcohol moderation, and smoking cessation.;Monitor prescription use compliance.    Expected Outcomes Short Term: Continued assessment and intervention until BP is < 140/29mm HG in hypertensive participants. < 130/83mm HG in hypertensive participants with diabetes, heart failure or chronic kidney disease.;Long Term: Maintenance of blood pressure at goal levels.    Lipids Yes    Intervention Provide education and support for participant on nutrition & aerobic/resistive exercise along with prescribed medications to achieve LDL 70mg , HDL >40mg .    Expected Outcomes Short Term: Participant  states understanding of desired cholesterol values and is compliant with medications prescribed. Participant is following exercise prescription and nutrition guidelines.;Long Term: Cholesterol controlled with medications as prescribed, with individualized exercise RX and with personalized nutrition plan. Value goals: LDL < 70mg , HDL > 40 mg.          Education:Diabetes - Individual verbal and written instruction to review signs/symptoms of diabetes, desired ranges of glucose level fasting, after meals and with exercise. Acknowledge that pre and post exercise glucose checks will be done for 3 sessions at entry of program.   Core Components/Risk Factors/Patient Goals Review:    Core Components/Risk Factors/Patient Goals at Discharge (Final Review):    ITP Comments:  ITP Comments     Row Name 08/21/24 1408 08/27/24 1644 08/31/24 1409 09/02/24 0938 09/30/24 0745   ITP Comments Initial phone call completed. Diagnosis can be found in [facility] 11/20. EP Orientation scheduled for Monday 12/8 at 9am. Completed and gym orientation for cardiac rehab. Initial ITP created and sent for review to Dr. Oneil Pinal,  Medical Director. First full day of exercise!  Patient was oriented to gym and equipment including functions, settings, policies, and procedures.  Patient's individual exercise prescription and treatment plan were reviewed.  All starting workloads were established based on the results of the 6 minute walk test done at initial orientation visit.  The plan for exercise progression was also introduced and progression will be customized based on patient's performance and goals. 30 Day review completed. Medical Director ITP review done, changes made as directed, and signed approval by Medical Director. New to Program 30 Day review completed. Medical Director ITP review done, changes made as directed, and signed approval by Medical Director.      Comments: 30 day review     [1]  Current Outpatient  Medications:    albuterol  (VENTOLIN  HFA) 108 (90 Base) MCG/ACT inhaler, Inhale 2 puffs into the lungs every 6 (six) hours as needed for wheezing or shortness of breath., Disp: , Rfl:    amiodarone  (PACERONE ) 200 MG tablet, Take 200 mg by mouth daily., Disp: , Rfl:    aspirin  81 MG tablet, Take 81 mg by mouth daily., Disp: , Rfl:    benzonatate  (TESSALON ) 100 MG capsule, Take 2 capsules (200 mg total) by mouth every 8 (eight) hours., Disp: 21 capsule, Rfl: 0   clopidogrel (PLAVIX) 75 MG tablet, Take 75 mg by mouth., Disp: , Rfl:    dapagliflozin  propanediol (FARXIGA ) 10 MG TABS tablet, Take 10 mg by mouth daily., Disp: , Rfl:    diphenoxylate -atropine  (LOMOTIL ) 2.5-0.025 MG tablet, Take 2 tablets by mouth 2 (two) times daily as needed for diarrhea or loose stools., Disp: 20 tablet, Rfl: 1   guaiFENesin  (MUCINEX ) 600 MG 12 hr tablet, Take 600 mg by mouth., Disp: , Rfl:    ipratropium (ATROVENT  HFA) 17 MCG/ACT inhaler, Inhale 2 puffs into the lungs every 8 (eight) hours., Disp: 1 each, Rfl: 12   ipratropium (ATROVENT ) 0.06 % nasal spray, Place 2 sprays into both nostrils 4 (four) times daily., Disp: 15 mL, Rfl: 12   metoprolol  succinate (TOPROL -XL) 25 MG 24 hr tablet, Take 1 tablet (25 mg total) by mouth daily., Disp: 30 tablet, Rfl: 0   montelukast  (SINGULAIR ) 10 MG tablet, Take 1 tablet (10 mg total) by mouth daily., Disp: 30 tablet, Rfl: 5   nitroGLYCERIN  (NITROSTAT ) 0.4 MG SL tablet, Place 0.4 mg under the tongue., Disp: , Rfl:    omeprazole (PRILOSEC) 20 MG capsule, Take 20 mg by mouth at bedtime., Disp: , Rfl:    OXYGEN , Inhale 2 L into the lungs every evening. Via cpap, Disp: , Rfl:    promethazine -dextromethorphan  (PROMETHAZINE -DM) 6.25-15 MG/5ML syrup, Take 5 mLs by mouth 4 (four) times daily as needed., Disp: 118 mL, Rfl: 0   roflumilast (DALIRESP) 500 MCG TABS tablet, Take 500 mcg by mouth daily., Disp: , Rfl:    rosuvastatin  (CRESTOR ) 40 MG tablet, Take 1 tablet by mouth once daily, Disp:  90 tablet, Rfl: 3   sacubitril -valsartan  (ENTRESTO ) 97-103 MG, Take 1 tablet by mouth 2 (two) times daily., Disp: , Rfl:    [Paused] spironolactone  (ALDACTONE ) 25 MG tablet, Take 12.5 mg by mouth daily. Half tablet, Disp: , Rfl:    torsemide (DEMADEX) 20 MG tablet, Take 40 mg by mouth daily., Disp: , Rfl:    venlafaxine  XR (EFFEXOR -XR) 150 MG 24 hr capsule, TAKE 1 CAPSULE BY MOUTH ONCE DAILY WITH 75MG  DAILY, Disp: 90 capsule, Rfl: 3 [2]  Social History Tobacco Use  Smoking Status Former  Current packs/day: 0.00   Average packs/day: 1 pack/day for 41.0 years (41.0 ttl pk-yrs)   Types: Cigars, Cigarettes   Quit date: 07/2023   Years since quitting: 1.2  Smokeless Tobacco Never  Tobacco Comments   smokes 1-2 cigars daily; quit cigs. 30+ years ago

## 2024-10-20 ENCOUNTER — Ambulatory Visit: Payer: Medicare Other

## 2024-10-21 ENCOUNTER — Telehealth: Payer: Self-pay

## 2024-10-21 NOTE — Telephone Encounter (Signed)
 We attempted to call Pt today as he has been out on medical hold since 09/07/2024. There was no answer. We left a voicemail requesting that he call us  back with an update on his status in the program.
